# Patient Record
Sex: Male | Born: 1950 | Race: White | Hispanic: No | Smoking: Former smoker
Health system: Southern US, Community
[De-identification: ages and names within clinical notes are randomized; demographics above are authoritative.]

## PROBLEM LIST (undated history)

## (undated) DIAGNOSIS — Z8679 Personal history of other diseases of the circulatory system: Secondary | ICD-10-CM

## (undated) DIAGNOSIS — T8859XA Other complications of anesthesia, initial encounter: Secondary | ICD-10-CM

## (undated) DIAGNOSIS — N200 Calculus of kidney: Secondary | ICD-10-CM

## (undated) DIAGNOSIS — M51379 Other intervertebral disc degeneration, lumbosacral region without mention of lumbar back pain or lower extremity pain: Secondary | ICD-10-CM

## (undated) DIAGNOSIS — E782 Mixed hyperlipidemia: Secondary | ICD-10-CM

## (undated) DIAGNOSIS — T4145XA Adverse effect of unspecified anesthetic, initial encounter: Secondary | ICD-10-CM

## (undated) DIAGNOSIS — M1990 Unspecified osteoarthritis, unspecified site: Secondary | ICD-10-CM

## (undated) DIAGNOSIS — N201 Calculus of ureter: Secondary | ICD-10-CM

## (undated) DIAGNOSIS — N400 Enlarged prostate without lower urinary tract symptoms: Secondary | ICD-10-CM

## (undated) DIAGNOSIS — R319 Hematuria, unspecified: Secondary | ICD-10-CM

## (undated) DIAGNOSIS — K219 Gastro-esophageal reflux disease without esophagitis: Secondary | ICD-10-CM

## (undated) DIAGNOSIS — M549 Dorsalgia, unspecified: Secondary | ICD-10-CM

## (undated) DIAGNOSIS — N289 Disorder of kidney and ureter, unspecified: Secondary | ICD-10-CM

## (undated) DIAGNOSIS — I10 Essential (primary) hypertension: Secondary | ICD-10-CM

## (undated) DIAGNOSIS — N1831 Chronic kidney disease, stage 3a: Secondary | ICD-10-CM

## (undated) DIAGNOSIS — G8929 Other chronic pain: Secondary | ICD-10-CM

## (undated) DIAGNOSIS — R200 Anesthesia of skin: Secondary | ICD-10-CM

## (undated) DIAGNOSIS — M5137 Other intervertebral disc degeneration, lumbosacral region: Secondary | ICD-10-CM

## (undated) DIAGNOSIS — J189 Pneumonia, unspecified organism: Secondary | ICD-10-CM

## (undated) DIAGNOSIS — Z87442 Personal history of urinary calculi: Secondary | ICD-10-CM

## (undated) HISTORY — PX: BICEPS TENDON REPAIR: SHX566

## (undated) HISTORY — PX: LUMBAR LAMINECTOMY: SHX95

## (undated) HISTORY — PX: ANTERIOR CERVICAL DECOMP/DISCECTOMY FUSION: SHX1161

## (undated) HISTORY — PX: OTHER SURGICAL HISTORY: SHX169

## (undated) HISTORY — PX: EXTRACORPOREAL SHOCK WAVE LITHOTRIPSY: SHX1557

## (undated) HISTORY — PX: CHOLECYSTECTOMY: SHX55

---
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
## 1999-03-26 ENCOUNTER — Encounter: Payer: Self-pay | Admitting: Emergency Medicine

## 1999-03-26 ENCOUNTER — Emergency Department (HOSPITAL_COMMUNITY): Admission: EM | Admit: 1999-03-26 | Discharge: 1999-03-26 | Payer: Self-pay | Admitting: Emergency Medicine

## 1999-04-04 ENCOUNTER — Encounter: Payer: Self-pay | Admitting: Emergency Medicine

## 1999-04-04 ENCOUNTER — Emergency Department (HOSPITAL_COMMUNITY): Admission: EM | Admit: 1999-04-04 | Discharge: 1999-04-04 | Payer: Self-pay | Admitting: Emergency Medicine

## 2002-06-01 ENCOUNTER — Encounter: Payer: Self-pay | Admitting: Neurosurgery

## 2002-06-05 ENCOUNTER — Inpatient Hospital Stay (HOSPITAL_COMMUNITY): Admission: RE | Admit: 2002-06-05 | Discharge: 2002-06-06 | Payer: Self-pay | Admitting: Neurosurgery

## 2002-06-05 ENCOUNTER — Encounter: Payer: Self-pay | Admitting: Neurosurgery

## 2003-02-27 ENCOUNTER — Emergency Department (HOSPITAL_COMMUNITY): Admission: EM | Admit: 2003-02-27 | Discharge: 2003-02-28 | Payer: Self-pay | Admitting: Emergency Medicine

## 2003-02-27 ENCOUNTER — Encounter: Payer: Self-pay | Admitting: Emergency Medicine

## 2003-03-08 ENCOUNTER — Encounter: Payer: Self-pay | Admitting: *Deleted

## 2003-03-08 ENCOUNTER — Emergency Department (HOSPITAL_COMMUNITY): Admission: EM | Admit: 2003-03-08 | Discharge: 2003-03-08 | Payer: Self-pay | Admitting: *Deleted

## 2003-03-21 ENCOUNTER — Encounter: Payer: Self-pay | Admitting: Urology

## 2003-03-21 ENCOUNTER — Ambulatory Visit (HOSPITAL_BASED_OUTPATIENT_CLINIC_OR_DEPARTMENT_OTHER): Admission: RE | Admit: 2003-03-21 | Discharge: 2003-03-21 | Payer: Self-pay | Admitting: Urology

## 2003-04-04 ENCOUNTER — Ambulatory Visit (HOSPITAL_BASED_OUTPATIENT_CLINIC_OR_DEPARTMENT_OTHER): Admission: RE | Admit: 2003-04-04 | Discharge: 2003-04-04 | Payer: Self-pay | Admitting: Urology

## 2005-05-26 ENCOUNTER — Ambulatory Visit (HOSPITAL_COMMUNITY): Admission: RE | Admit: 2005-05-26 | Discharge: 2005-05-26 | Payer: Self-pay | Admitting: Emergency Medicine

## 2006-03-29 ENCOUNTER — Inpatient Hospital Stay (HOSPITAL_COMMUNITY): Admission: RE | Admit: 2006-03-29 | Discharge: 2006-03-30 | Payer: Self-pay | Admitting: Neurosurgery

## 2006-09-02 ENCOUNTER — Encounter: Admission: RE | Admit: 2006-09-02 | Discharge: 2006-12-01 | Payer: Self-pay | Admitting: Neurosurgery

## 2007-06-25 ENCOUNTER — Inpatient Hospital Stay (HOSPITAL_COMMUNITY): Admission: EM | Admit: 2007-06-25 | Discharge: 2007-06-29 | Payer: Self-pay | Admitting: Emergency Medicine

## 2007-06-26 ENCOUNTER — Encounter (INDEPENDENT_AMBULATORY_CARE_PROVIDER_SITE_OTHER): Payer: Self-pay | Admitting: Internal Medicine

## 2007-07-02 ENCOUNTER — Inpatient Hospital Stay (HOSPITAL_COMMUNITY): Admission: EM | Admit: 2007-07-02 | Discharge: 2007-07-08 | Payer: Self-pay | Admitting: Emergency Medicine

## 2007-07-02 ENCOUNTER — Ambulatory Visit: Payer: Self-pay | Admitting: Internal Medicine

## 2007-07-02 ENCOUNTER — Ambulatory Visit: Payer: Self-pay | Admitting: Cardiology

## 2007-07-04 ENCOUNTER — Ambulatory Visit: Payer: Self-pay | Admitting: Infectious Diseases

## 2007-07-04 ENCOUNTER — Encounter: Payer: Self-pay | Admitting: Cardiology

## 2007-07-04 HISTORY — PX: TRANSTHORACIC ECHOCARDIOGRAM: SHX275

## 2009-02-21 ENCOUNTER — Emergency Department (HOSPITAL_COMMUNITY): Admission: EM | Admit: 2009-02-21 | Discharge: 2009-02-22 | Payer: Self-pay | Admitting: Emergency Medicine

## 2009-02-23 ENCOUNTER — Emergency Department (HOSPITAL_COMMUNITY): Admission: EM | Admit: 2009-02-23 | Discharge: 2009-02-23 | Payer: Self-pay | Admitting: Emergency Medicine

## 2009-03-03 ENCOUNTER — Ambulatory Visit (HOSPITAL_COMMUNITY): Admission: RE | Admit: 2009-03-03 | Discharge: 2009-03-03 | Payer: Self-pay | Admitting: Urology

## 2009-03-03 HISTORY — PX: OTHER SURGICAL HISTORY: SHX169

## 2009-03-13 HISTORY — PX: OTHER SURGICAL HISTORY: SHX169

## 2009-03-18 ENCOUNTER — Ambulatory Visit (HOSPITAL_BASED_OUTPATIENT_CLINIC_OR_DEPARTMENT_OTHER): Admission: RE | Admit: 2009-03-18 | Discharge: 2009-03-18 | Payer: Self-pay | Admitting: Urology

## 2009-09-18 ENCOUNTER — Ambulatory Visit (HOSPITAL_COMMUNITY): Admission: RE | Admit: 2009-09-18 | Discharge: 2009-09-18 | Payer: Self-pay | Admitting: Urology

## 2009-09-25 HISTORY — PX: OTHER SURGICAL HISTORY: SHX169

## 2009-11-27 ENCOUNTER — Ambulatory Visit (HOSPITAL_COMMUNITY): Admission: RE | Admit: 2009-11-27 | Discharge: 2009-11-27 | Payer: Self-pay | Admitting: Urology

## 2010-07-30 ENCOUNTER — Inpatient Hospital Stay (HOSPITAL_COMMUNITY): Admission: EM | Admit: 2010-07-30 | Discharge: 2009-09-29 | Payer: Self-pay | Admitting: Emergency Medicine

## 2010-09-07 LAB — CBC
HCT: 49.2 % (ref 39.0–52.0)
Hemoglobin: 16.7 g/dL (ref 13.0–17.0)
MCH: 29.6 pg (ref 26.0–34.0)
MCHC: 33.9 g/dL (ref 30.0–36.0)
MCV: 87.1 fL (ref 78.0–100.0)
Platelets: 187 10*3/uL (ref 150–400)
RBC: 5.65 MIL/uL (ref 4.22–5.81)
RDW: 13.6 % (ref 11.5–15.5)
WBC: 6.9 10*3/uL (ref 4.0–10.5)

## 2010-09-07 LAB — URINALYSIS, ROUTINE W REFLEX MICROSCOPIC
Bilirubin Urine: NEGATIVE
Ketones, ur: NEGATIVE mg/dL
Leukocytes, UA: NEGATIVE
Nitrite: NEGATIVE
Protein, ur: NEGATIVE mg/dL
Specific Gravity, Urine: 1.009 (ref 1.005–1.030)
Urine Glucose, Fasting: NEGATIVE mg/dL
Urobilinogen, UA: 0.2 mg/dL (ref 0.0–1.0)
pH: 6 (ref 5.0–8.0)

## 2010-09-07 LAB — URINE MICROSCOPIC-ADD ON

## 2010-09-07 LAB — BASIC METABOLIC PANEL
BUN: 15 mg/dL (ref 6–23)
CO2: 28 mEq/L (ref 19–32)
Calcium: 9.6 mg/dL (ref 8.4–10.5)
Chloride: 101 mEq/L (ref 96–112)
Creatinine, Ser: 1.56 mg/dL — ABNORMAL HIGH (ref 0.4–1.5)
GFR calc Af Amer: 56 mL/min — ABNORMAL LOW (ref >60)
GFR calc non Af Amer: 46 mL/min — ABNORMAL LOW (ref >60)
Glucose, Bld: 89 mg/dL (ref 70–99)
Potassium: 3.8 mEq/L (ref 3.5–5.1)
Sodium: 137 mEq/L (ref 135–145)

## 2010-09-07 LAB — PROTIME-INR
INR: 1 (ref 0.00–1.49)
Prothrombin Time: 13.4 seconds (ref 11.6–15.2)

## 2010-09-07 LAB — SURGICAL PCR SCREEN
MRSA, PCR: NEGATIVE
Staphylococcus aureus: POSITIVE — AB

## 2010-09-07 LAB — ABO/RH: ABO/RH(D): O NEG

## 2010-09-07 LAB — APTT: aPTT: 26 seconds (ref 24–37)

## 2010-09-08 ENCOUNTER — Inpatient Hospital Stay (HOSPITAL_COMMUNITY)
Admission: RE | Admit: 2010-09-08 | Discharge: 2010-09-10 | Payer: BLUE CROSS/BLUE SHIELD | Source: Home / Self Care | Attending: Neurosurgery | Admitting: Neurosurgery

## 2010-09-08 HISTORY — PX: POSTERIOR FUSION LUMBAR SPINE: SUR632

## 2010-09-14 LAB — CROSSMATCH
ABO/RH(D): O NEG
Antibody Screen: NEGATIVE
Unit division: 0
Unit division: 0
Unit division: 0
Unit division: 0

## 2010-09-14 LAB — CBC
HCT: 38.2 % — ABNORMAL LOW (ref 39.0–52.0)
Hemoglobin: 12.8 g/dL — ABNORMAL LOW (ref 13.0–17.0)
MCH: 29 pg (ref 26.0–34.0)
MCHC: 33.5 g/dL (ref 30.0–36.0)
MCV: 86.6 fL (ref 78.0–100.0)
Platelets: 143 10*3/uL — ABNORMAL LOW (ref 150–400)
RBC: 4.41 MIL/uL (ref 4.22–5.81)
RDW: 13.6 % (ref 11.5–15.5)
WBC: 11.2 10*3/uL — ABNORMAL HIGH (ref 4.0–10.5)

## 2010-09-14 LAB — BASIC METABOLIC PANEL
BUN: 16 mg/dL (ref 6–23)
CO2: 26 mEq/L (ref 19–32)
Calcium: 8.2 mg/dL — ABNORMAL LOW (ref 8.4–10.5)
Chloride: 105 mEq/L (ref 96–112)
Creatinine, Ser: 1.43 mg/dL (ref 0.4–1.5)
GFR calc Af Amer: >60 mL/min (ref >60)
GFR calc non Af Amer: 51 mL/min — ABNORMAL LOW (ref >60)
Glucose, Bld: 124 mg/dL — ABNORMAL HIGH (ref 70–99)
Potassium: 4 mEq/L (ref 3.5–5.1)
Sodium: 135 mEq/L (ref 135–145)

## 2010-09-14 NOTE — Op Note (Signed)
NAME:  JAYSEN, WEY NO.:  [id number]  MEDICAL RECORD NO.:  [id number]          PATIENT TYPE:  INP  LOCATION:  2899                         FACILITY:  [facility]  PHYSICIAN:  Clydene Fake, M.D.  DATE OF BIRTH:  March 17, 1951  DATE OF PROCEDURE:  09/08/2010 DATE OF DISCHARGE:                              OPERATIVE REPORT   PREOPERATIVE DIAGNOSES:  Lumbar stenosis, degenerative disk disease, spondylosis with prior surgery L3-S1 and retrolisthesis 4-5.  POSTOPERATIVE DIAGNOSES:  Lumbar stenosis, degenerative disk disease, spondylosis with prior surgery L3-S1 and retrolisthesis 4-5.  PROCEDURE:  Redo decompressive laminectomy, decompressing the L3, L4, L5, and S1 roots (four levels), posterolateral fusion L3-S1 (three levels), segmented Expedient pedicle screw fixation L3 through S1, autograft same incision, infuse BMP, microdissection microscope.  SURGEON:  Clydene Fake, M.D.  ASSISTANT:  Coletta Memos, M.D.  ANESTHESIA:  General endotracheal tube anesthesia.  ESTIMATED BLOOD LOSS:  1550 mL.  BLOOD GIVEN:  660 mL, CellSaver return.  COMPLICATIONS:  A rent occurred, which was repaired primarily with 6-0 Prolene and sealed with DuraSeal.  REASON FOR PROCEDURE:  The patient is a 60 year old gentleman who has had back pain radiating to his right greater than left leg with trouble doing activities and ambulating.  Workup include x-rays and MRI showing retrolisthesis of 4-5, multilevel spondylitic change and lateral recess stenosis at multiple levels with prior surgery at the 5-1 level.  The patient was brought in for decompression and fusion of the lumbar spine. He has been through years of conservative management, NSAIDs, and epidural injections.  He has been through physical therapy and was doing home exercises and none of this has helped.  PROCEDURE IN DETAIL:  The patient was brought into the operating room. General anesthesia was induced.  The patient  was placed in prone position on the Wilson frame with all pressure points padded.  The patient was prepped and draped in sterile fashion.  Site of incision injected with 20 mL of 1% lidocaine with epinephrine.  Incision was made in the lower lumbar spine, site of prior surgery, and this incision extended cephalad from there.  Incision taken down the fascia. Hemostasis obtained with Bovie cauterization.  The fascia was incised. Bilateral and subperiosteal dissection was done over the spinous process of the lamina to the facets.  On the significant right side, we exposed the transverse processes of L3, 4, 5 in lateral sacrum and I placed markers there and took an x-ray, and this confirmed our positioning.  We exposed the contralateral side and placed subcutaneous retractors.  I could see the scar at the L5-1 level on the right side.  We used decompressive laminectomy decompressing the spinous process of lamina of L3 through top of the sacrum.  We carefully decompressed around the __________ dissected through the end of dura where a rent occurred near the previous 5-1 surgery.  Microscopic microdissection continued to decompress the thecal sac and then the dural rent was able to be repaired primarily with 6-0 Prolene with a figure-of-eight suture. After that, there was no further leak of CSF.  Then, we continued our decompressive laminectomy to decompress the  central canal and then decompressing out laterally, doing a medial facetectomies throughout the extent of our procedure L3 through sacrum and performing foraminotomies, decompressing the L3, L4, L5, and S1 roots bilaterally.  Because of CSF leak, we did have some increased bleeding.  We used CellSaver.  With that, patient remained hemodynamically stable.  We explored the disk spaces and there was broad-based disk bulge in the base of the disk space, and the dura was able to be dissected away from the disk space with good decompression  of the central canal and the foramen.  With the nerve roots all decompressed, it was decided not to proceed with posterior lumbar interbody fusion and just posterior fusion with instrumentation since we had good decompression.  At this point, the transverse process and lateral facets were decorticated with high-speed drill from L3-S1 bilaterally, using intraoperative landmarks and fluoroscopy as a guide, pedicle entry points were found.  High-speed drill was used to decorticate.  Placed a probe down the pedicle. Checked the hole with a small ball probe, making sure we had good bony circumference.  We tapped the hole and then placed a X medium screw, 50- mm long and 6-mm wide into the pedicle, and this was done bilaterally at L3, L4, L5, and S1.  We placed rods into the screw heads and tightened down the nuts.  Then, we took final AP and lateral fluoroscopic images showing good position of the screws and rods from L3-S1.  The nuts were then final tightened.  Infuse BMP was then packed into the posterolateral gutters from L3 through S1 and then autograft bone.  All the bone removed during laminectomy that was cleaned from its soft tissue, chopped up into small pieces, and this bone was packed in the posterolateral gutters for posterolateral fusion, L3 through S1 bilaterally.  We then placed a cross-connector between the rods for further support.  We then explored the dura and nerve roots again, ensuring we had good decompression of the central canal and the L3-S1 nerve roots bilaterally.  DuraSeal was placed over the area of the dural rents and retractors removed.  Fascia closed with 0 Vicryl interrupted sutures.  Subcutaneous tissue closed with 2-0 and 3-0 Vicryl interrupted sutures.  Skin closed with benzoin and Steri-Strips.  Dressing was placed.  The patient was placed back in supine position, reversed from anesthesia, and transferred to recovery in stable condition.           ______________________________ Clydene Fake, M.D.     JRH/MEDQ  D:  09/08/2010  T:  09/09/2010  Job:  [id number]  Electronically Signed by Colon Branch M.D. on 09/14/2010 12:38:18 PM

## 2010-11-11 LAB — DIFFERENTIAL
Basophils Absolute: 0 10*3/uL (ref 0.0–0.1)
Basophils Absolute: 0 10*3/uL (ref 0.0–0.1)
Basophils Absolute: 0.1 10*3/uL (ref 0.0–0.1)
Basophils Absolute: 0.1 10*3/uL (ref 0.0–0.1)
Basophils Relative: 0 % (ref 0–1)
Basophils Relative: 0 % (ref 0–1)
Basophils Relative: 1 % (ref 0–1)
Basophils Relative: 1 % (ref 0–1)
Eosinophils Absolute: 0 10*3/uL (ref 0.0–0.7)
Eosinophils Absolute: 0 10*3/uL (ref 0.0–0.7)
Eosinophils Absolute: 0.1 10*3/uL (ref 0.0–0.7)
Eosinophils Absolute: 0.2 10*3/uL (ref 0.0–0.7)
Eosinophils Relative: 0 % (ref 0–5)
Eosinophils Relative: 1 % (ref 0–5)
Eosinophils Relative: 1 % (ref 0–5)
Eosinophils Relative: 2 % (ref 0–5)
Lymphocytes Relative: 10 % — ABNORMAL LOW (ref 12–46)
Lymphocytes Relative: 11 % — ABNORMAL LOW (ref 12–46)
Lymphocytes Relative: 19 % (ref 12–46)
Lymphocytes Relative: 8 % — ABNORMAL LOW (ref 12–46)
Lymphs Abs: 1 10*3/uL (ref 0.7–4.0)
Lymphs Abs: 1 10*3/uL (ref 0.7–4.0)
Lymphs Abs: 1.3 10*3/uL (ref 0.7–4.0)
Lymphs Abs: 2.2 10*3/uL (ref 0.7–4.0)
Monocytes Absolute: 1 10*3/uL (ref 0.1–1.0)
Monocytes Absolute: 1.2 10*3/uL — ABNORMAL HIGH (ref 0.1–1.0)
Monocytes Absolute: 1.3 10*3/uL — ABNORMAL HIGH (ref 0.1–1.0)
Monocytes Absolute: 1.4 10*3/uL — ABNORMAL HIGH (ref 0.1–1.0)
Monocytes Relative: 11 % (ref 3–12)
Monocytes Relative: 12 % (ref 3–12)
Monocytes Relative: 12 % (ref 3–12)
Monocytes Relative: 8 % (ref 3–12)
Neutro Abs: 7.7 10*3/uL (ref 1.7–7.7)
Neutro Abs: 8 10*3/uL — ABNORMAL HIGH (ref 1.7–7.7)
Neutro Abs: 9.1 10*3/uL — ABNORMAL HIGH (ref 1.7–7.7)
Neutro Abs: 9.6 10*3/uL — ABNORMAL HIGH (ref 1.7–7.7)
Neutrophils Relative %: 67 % (ref 43–77)
Neutrophils Relative %: 76 % (ref 43–77)
Neutrophils Relative %: 78 % — ABNORMAL HIGH (ref 43–77)
Neutrophils Relative %: 82 % — ABNORMAL HIGH (ref 43–77)

## 2010-11-11 LAB — CBC
HCT: 38.1 % — ABNORMAL LOW (ref 39.0–52.0)
HCT: 39.2 % (ref 39.0–52.0)
HCT: 39.4 % (ref 39.0–52.0)
HCT: 43.1 % (ref 39.0–52.0)
Hemoglobin: 13.2 g/dL (ref 13.0–17.0)
Hemoglobin: 13.4 g/dL (ref 13.0–17.0)
Hemoglobin: 13.6 g/dL (ref 13.0–17.0)
Hemoglobin: 14.8 g/dL (ref 13.0–17.0)
MCHC: 34 g/dL (ref 30.0–36.0)
MCHC: 34.4 g/dL (ref 30.0–36.0)
MCHC: 34.6 g/dL (ref 30.0–36.0)
MCHC: 34.6 g/dL (ref 30.0–36.0)
MCV: 88.5 fL (ref 78.0–100.0)
MCV: 88.6 fL (ref 78.0–100.0)
MCV: 88.8 fL (ref 78.0–100.0)
MCV: 88.8 fL (ref 78.0–100.0)
Platelets: 168 10*3/uL (ref 150–400)
Platelets: 194 10*3/uL (ref 150–400)
Platelets: 211 10*3/uL (ref 150–400)
Platelets: 220 10*3/uL (ref 150–400)
RBC: 4.3 MIL/uL (ref 4.22–5.81)
RBC: 4.41 MIL/uL (ref 4.22–5.81)
RBC: 4.45 MIL/uL (ref 4.22–5.81)
RBC: 4.87 MIL/uL (ref 4.22–5.81)
RDW: 13.1 % (ref 11.5–15.5)
RDW: 13.2 % (ref 11.5–15.5)
RDW: 13.4 % (ref 11.5–15.5)
RDW: 13.6 % (ref 11.5–15.5)
WBC: 10.1 10*3/uL (ref 4.0–10.5)
WBC: 11.7 10*3/uL — ABNORMAL HIGH (ref 4.0–10.5)
WBC: 11.7 10*3/uL — ABNORMAL HIGH (ref 4.0–10.5)
WBC: 11.8 10*3/uL — ABNORMAL HIGH (ref 4.0–10.5)

## 2010-11-11 LAB — URINE CULTURE
Colony Count: 30000
Colony Count: 75000
Special Requests: NEGATIVE

## 2010-11-11 LAB — CULTURE, BLOOD (ROUTINE X 2)
Culture: NO GROWTH
Culture: NO GROWTH

## 2010-11-11 LAB — URINE MICROSCOPIC-ADD ON

## 2010-11-11 LAB — POCT I-STAT, CHEM 8
BUN: 22 mg/dL (ref 6–23)
Calcium, Ion: 1.18 mmol/L (ref 1.12–1.32)
Chloride: 106 mEq/L (ref 96–112)
Creatinine, Ser: 1.8 mg/dL — ABNORMAL HIGH (ref 0.4–1.5)
Glucose, Bld: 126 mg/dL — ABNORMAL HIGH (ref 70–99)
HCT: 45 % (ref 39.0–52.0)
Hemoglobin: 15.3 g/dL (ref 13.0–17.0)
Potassium: 4 mEq/L (ref 3.5–5.1)
Sodium: 137 mEq/L (ref 135–145)
TCO2: 27 mmol/L (ref 0–100)

## 2010-11-11 LAB — URINALYSIS, ROUTINE W REFLEX MICROSCOPIC
Bilirubin Urine: NEGATIVE
Glucose, UA: NEGATIVE mg/dL
Hgb urine dipstick: NEGATIVE
Ketones, ur: NEGATIVE mg/dL
Nitrite: NEGATIVE
Protein, ur: NEGATIVE mg/dL
Specific Gravity, Urine: 1.014 (ref 1.005–1.030)
Urobilinogen, UA: 1 mg/dL (ref 0.0–1.0)
pH: 8.5 — ABNORMAL HIGH (ref 5.0–8.0)

## 2010-11-29 LAB — URINALYSIS, ROUTINE W REFLEX MICROSCOPIC
Bilirubin Urine: NEGATIVE
Bilirubin Urine: NEGATIVE
Glucose, UA: NEGATIVE mg/dL
Glucose, UA: NEGATIVE mg/dL
Ketones, ur: NEGATIVE mg/dL
Ketones, ur: NEGATIVE mg/dL
Nitrite: NEGATIVE
Nitrite: NEGATIVE
Protein, ur: 30 mg/dL — AB
Protein, ur: NEGATIVE mg/dL
Specific Gravity, Urine: 1.021 (ref 1.005–1.030)
Specific Gravity, Urine: 1.026 (ref 1.005–1.030)
Urobilinogen, UA: 0.2 mg/dL (ref 0.0–1.0)
Urobilinogen, UA: 1 mg/dL (ref 0.0–1.0)
pH: 6 (ref 5.0–8.0)
pH: 6.5 (ref 5.0–8.0)

## 2010-11-29 LAB — DIFFERENTIAL
Basophils Absolute: 0.1 10*3/uL (ref 0.0–0.1)
Basophils Relative: 1 % (ref 0–1)
Eosinophils Absolute: 0.2 10*3/uL (ref 0.0–0.7)
Eosinophils Relative: 2 % (ref 0–5)
Lymphocytes Relative: 21 % (ref 12–46)
Lymphs Abs: 1.8 10*3/uL (ref 0.7–4.0)
Monocytes Absolute: 0.8 10*3/uL (ref 0.1–1.0)
Monocytes Relative: 10 % (ref 3–12)
Neutro Abs: 5.7 10*3/uL (ref 1.7–7.7)
Neutrophils Relative %: 67 % (ref 43–77)

## 2010-11-29 LAB — COMPREHENSIVE METABOLIC PANEL
ALT: 17 U/L (ref 0–53)
AST: 21 U/L (ref 0–37)
Albumin: 4 g/dL (ref 3.5–5.2)
Alkaline Phosphatase: 75 U/L (ref 39–117)
BUN: 26 mg/dL — ABNORMAL HIGH (ref 6–23)
CO2: 28 mEq/L (ref 19–32)
Calcium: 9.2 mg/dL (ref 8.4–10.5)
Chloride: 108 mEq/L (ref 96–112)
Creatinine, Ser: 1.55 mg/dL — ABNORMAL HIGH (ref 0.4–1.5)
GFR calc Af Amer: 56 mL/min — ABNORMAL LOW (ref >60)
GFR calc non Af Amer: 47 mL/min — ABNORMAL LOW (ref >60)
Glucose, Bld: 106 mg/dL — ABNORMAL HIGH (ref 70–99)
Potassium: 4 mEq/L (ref 3.5–5.1)
Sodium: 140 mEq/L (ref 135–145)
Total Bilirubin: 0.9 mg/dL (ref 0.3–1.2)
Total Protein: 6.8 g/dL (ref 6.0–8.3)

## 2010-11-29 LAB — CBC
HCT: 43.9 % (ref 39.0–52.0)
Hemoglobin: 15.1 g/dL (ref 13.0–17.0)
MCHC: 34.3 g/dL (ref 30.0–36.0)
MCV: 88.5 fL (ref 78.0–100.0)
Platelets: 182 10*3/uL (ref 150–400)
RBC: 4.96 MIL/uL (ref 4.22–5.81)
RDW: 13.5 % (ref 11.5–15.5)
WBC: 8.6 10*3/uL (ref 4.0–10.5)

## 2010-11-29 LAB — URINE MICROSCOPIC-ADD ON

## 2010-11-29 LAB — URINE CULTURE: Colony Count: 5000

## 2010-11-29 LAB — HEMOGLOBIN AND HEMATOCRIT, BLOOD
HCT: 40.8 % (ref 39.0–52.0)
Hemoglobin: 13.7 g/dL (ref 13.0–17.0)

## 2010-11-29 LAB — POCT HEMOGLOBIN-HEMACUE: Hemoglobin: 15.1 g/dL (ref 13.0–17.0)

## 2010-11-29 LAB — LIPASE, BLOOD: Lipase: 26 U/L (ref 11–59)

## 2011-01-05 NOTE — Consult Note (Signed)
NAME:  Anthony [name], Anthony [name] NO.:  [id number]   MEDICAL RECORD NO.:  [id number]          PATIENT TYPE:  INP   LOCATION:  3707                         FACILITY:  [facility]   PHYSICIAN:  Arturo Morton. Riley Kill, MD, FACCDATE OF BIRTH:  03/20/1951   DATE OF CONSULTATION:  07/03/2007  DATE OF DISCHARGE:                                 CONSULTATION   CHIEF COMPLAINT:  Chest pain, shortness of breath.   HISTORY OF PRESENT ILLNESS:  Anthony [name] is a 60 year old gentleman who  presented originally about a week ago with some low blood pressure,  tachycardia, and fever.  He was evaluated and thought to possibly have  urosepsis.  He was treated with antibiotics and discharged.  He re-  presented with recurrent chest pain.  The pain was sometimes sharp.  He  had been placed on Celebrex and subsequently stopped this.  He has had  some chills as well.  His initial chest x-ray demonstrated low-grade  mild atelectasis.  Repeat chest x-ray now demonstrates some increasing  bilateral lower lobe atelectasis.  Blood cultures were negative.  White  blood cell count peak was 9300.  Since discharge from the hospital, he  has not been significantly improved.   ALLERGIES:  OXYCODONE AND CODEINE.   CURRENT MEDICATIONS:  1. Aspirin 325 mg daily.  2. Rocephin.  3. Lovenox 40 mg daily.  4. Toprol-XL 12.5 mg daily.  5. Protonix 40 mg daily.  6. Potassium.  7. He is currently getting IV fluids.   He is currently not taking the Celebrex, and he has stopped Aleve.   PAST MEDICAL HISTORY:  1. Initially acute renal failure.  2. He has a history of mitral valve prolapse.  3. History of nephrolithiasis in the past.  4. He has had neck surgery x2.  5. Lumbar surgery.  6. Right biceps repair.   SOCIAL HISTORY:  The patient lives in [place] and alone.  He is an  unemployed Naval architect.  He is divorced.  He smoked a pipe but quit 3  years ago.   FAMILY HISTORY:  Mother died at 86 of a CVA.  Father died at  30, had a  CVA.  He has four siblings, one with CVA.   REVIEW OF SYSTEMS:  He has had fever and chills.  He says he continues  to feel chilling.  He has had some mild arthralgias.   PHYSICAL EXAMINATION:  GENERAL:  He is alert and oriented, in no acute  distress.  VITAL SIGNS:  The current blood pressure is 122/70, respiratory rate 16,  pulse 86, temperature 98.3, saturations are 92% on 2 liters.  NECK:  His jugular veins are not distended.  HEENT:  Unremarkable.  LUNGS:  Some decreased breath sounds at the base but no dullness to  percussion.  PMI is nondisplaced.  There is a normal first and second  heart sound.  There sounds like a prominent S4 gallop.  ABDOMEN:  Soft.  EXTREMITIES:  No definite edema.  There is no evidence of cellulitis.   LABORATORY DATA:  Hemoglobin is 11.7, hematocrit 34.6, white count  10,400.  BUN 17, creatinine 1.34.  Cardiac enzymes are negative x3.   EKG reveals sinus rhythm/sinus tachycardia.  There is a vertical and/or  rightward axis.  There is a small Q in III with T wave inversion.  There  is an S wave in lead I with a with vertical axis noted.   IMPRESSION:  Low-grade fever with bilateral atelectasis and right axis  deviation on electrocardiogram with bilateral atelectasis that is  slightly worse, fever, hypotension, and tachycardia.  I would be  concerned about the possibility of pulmonary emboli.  I would also be  concerned about the possibility of subacute bacterial endocarditis,  although blood cultures to date have been negative.   RECOMMENDATIONS:  1. D-dimer and 2-D echocardiogram.  2. ID consult.  3. Initiate IV heparin with gentle hydration, and when creatinine is      normal, consider a CT angio if the D-dimer is elevated.   I have discussed this with Dr. Lavera Guise, and he is in agreement with our  plans.      Arturo Morton. Riley Kill, MD, [facility]  Electronically Signed     TDS/MEDQ  D:  07/03/2007  T:  07/04/2007  Job:  [id number]

## 2011-01-05 NOTE — Op Note (Signed)
NAME:  Anthony [name], Anthony [name] NO.:  [id number]   MEDICAL RECORD NO.:  [id number]          PATIENT TYPE:  AMB   LOCATION:  DAY                          FACILITY:  [facility]   PHYSICIAN:  Excell Seltzer. Annabell Howells, M.D.    DATE OF BIRTH:  Jan 04, 1951   DATE OF PROCEDURE:  03/03/2009  DATE OF DISCHARGE:  03/03/2009                               OPERATIVE REPORT   PROCEDURE:  Cystoscopy, right retrograde pyelogram with interpretation,  right ureteroscopy with attempted stone manipulation, insertion of right  double-J stent.   PREOPERATIVE DIAGNOSIS:  Right proximal ureteral stone.   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   DRAIN:  6-French 26 cm double-J stent.   COMPLICATIONS:  None.   INDICATIONS:  Mr. Guilmette is a 60 year old male with history of stones  who been having persistent right flank pain and nausea.  He had a CT  scan several days ago that showed a right proximal stone that was about  2-3 mm.  KUBs in the office did not reveal an obvious stone.  There was  a questionable calcification in the right pelvis that was initially felt  to be vascular but that could have been a stone and after discussing  options with the patient I felt that ureteroscopy was indicated.   FINDINGS AND PROCEDURE:  The patient was taken to the operating room  after receiving Cipro.  A general anesthetic was induced.  He was placed  in lithotomy position.  His perineum and genitalia were prepped with  Betadine solution.  He was draped in the usual sterile fashion.  Cystoscopy was performed using a 22-French scope and 12 and 70 degrees  lenses.  Examination revealed a normal urethra.  The external sphincter  was intact.  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  Examination of bladder revealed mild trabeculation.  No  tumor, stones or inflammation were noted.  Ureteral orifices were  unremarkable.   The right ureteral  orifice was cannulated with 5-French open-end  catheter and contrast was instilled.   A right retrograde pyelogram demonstrated a small filling defect in the  proximal ureter consistent with the stone seen on CT scan.  There was no  hydronephrosis noted.   The guidewire was then passed through the open-end catheter to the  kidney.  The open-end catheter was removed along with the cystoscope.  A  12-French introducer sheath dilator was then passed over the wire but  would not progress beyond the iliac vessels.   Ureteroscope was then passed alongside the wire up to the iliac vessels  where there appeared to be narrowing consistent with stricture more than  kinking from the iliac vessels.   At this point I elected to place a double-J stent.  A 6-French 26 cm  stent was placed to the kidney without difficulty under fluoroscopic  guidance.  The wire was removed leaving good coil in the kidney and good  coil in the bladder.  The patient's bladder was drained.  The cystoscope  was removed.  He was taken down  from lithotomy position.  His  anesthetic was reversed.  He was removed to the recovery room in stable  condition.  He has sufficient medicine on hand and I am giving him  Pyridium for burning.  I will arrange a repeat ureteroscopy in 2 weeks  to give the ureter a chance to self dilate to allow easier access to the  stone.      Excell Seltzer. Annabell Howells, M.D.  Electronically Signed     JJW/MEDQ  D:  03/03/2009  T:  03/04/2009  Job:  [id number]

## 2011-01-05 NOTE — H&P (Signed)
NAME:  Anthony [name], Anthony [name] NO.:  [id number]   MEDICAL RECORD NO.:  [id number]          PATIENT TYPE:  INP   LOCATION:  2907                         FACILITY:  [facility]   PHYSICIAN:  Della Goo, M.D. DATE OF BIRTH:  01/09/1951   DATE OF ADMISSION:  06/25/2007  DATE OF DISCHARGE:                              HISTORY & PHYSICAL   PRIMARY CARE PHYSICIAN:  This is an unassigned patient.   CHIEF COMPLAINT:  Chest pain, nausea and vomiting.   HISTORY OF PRESENT ILLNESS:  This is a 60 year old male who was brought  to the emergency department secondary to onset of nausea and vomiting at  6 p.m.  He reports starting to have fevers, chills as well at that time.  He also describes having chest pain, which is substernal without  radiation, which has been going on for the past few days.  He associates  the chest pain with taking Celebrex medication and reports stopping this  one day ago.  He reports chest pain feels like a heaviness in his chest.  The patient also reports having dizziness and feeling as if he will pass  out.  On initial evaluation, the patient was found to have hypotension  and tachycardia.  He also was found to be febrile with a temperature of  101.6.  The patient reports eating in the evening at a [employer].  The patient describes his chest pain as being at the worst  a 7 to 8/10.   PAST MEDICAL HISTORY:  Past medical history is significant for:  1. Lumbar disk disease/degenerative disease.  2. Kidney stones.  3. Mitral valve prolapse.   MEDICATIONS:  Medications at this time are none.  The patient had  previously been on Celebrex 200 mg 1 p.o. daily.   PAST SURGICAL HISTORY:  1. History of cervical spine surgery times two.  2. Lumbar surgeries times two.  3. A right bicep tendon surgery.   SOCIAL HISTORY:  The patient is a nonsmoker, nondrinker.   FAMILY HISTORY:  Negative for coronary artery disease.  Positive for  hypertension,  diabetes mellitus in his mother.  Positive for CVA in both  parents, and positive in cancer in his mother and maternal family with  colon cancer.   REVIEW OF SYSTEMS:  Pertinents are mentioned above.  The patient denies  having any hematemesis, melena, hematochezia.  He denies having any  diarrhea or constipation.   PHYSICAL EXAMINATION:  GENERAL:  This is a pleasant, well-developed,  well-nourished 60 year old male in acute distress.  VITAL SIGNS:  Temperature 101.6.  Blood pressure initially 90/53.  It  decreased to 63/46.  Heart rate 131.  Respirations 22.  O2 saturation 95  to 98%.  HEENT:  Normocephalic, atraumatic.  There is facial erythema/flushing  present.  Pupils are equally round and reactive to light.  Extraocular  muscles are intact.  Funduscopic is benign.  Oropharynx is clear.  Dentition is poor.  There are multiple dental caries present.  NECK:  Supple with full range of motion.  No thyromegaly, adenopathy,  jugulovenous distention.  CARDIOVASCULAR:  Tachycardic rate  and rhythm.  No murmurs, gallops or  rubs.  RESPIRATORY:  Clear to auscultation bilaterally.  ABDOMEN:  Positive bowel sounds, soft, nontender, nondistended.  EXTREMITIES:  Without clubbing, cyanosis or edema.  NEUROLOGIC EXAMINATION:  Alert and oriented times three.  There are no  focal deficits.   LABORATORY DATA:  Sodium 138, potassium 4.0, chloride 109, hemoglobin  16.3, hematocrit 48.0, sodium 138, potassium 4.0, chloride 109, bicarb  23, BUN 22, creatinine 2.0,   ASSESSMENT:  A 60 year old male being admitted with:  1. Sepsis and septic shock.  2. Nausea and vomiting, possible food poisoning.  3. Chest pain.  4. Degenerative disk disease.  5. Mitral valve prolapse   PLAN:  The patient will continue on IV fluids for fluid resuscitation.  Blood cultures have been sent as well as urine cultures.  The urinalysis  is negative except for small leukocyte esterase.  The patient has been  placed  empirically on antibiotic therapy of vancomycin, Zosyn and  ciprofloxacin.  The patient does have mitral valvae prolapse and a  concern also is that the patient could have bacterial endocarditis.  He  does have very poor dentition.  The patient will also be placed in the  telemetry area for cardiac monitoring and cardiac enzymes will continue.  The patient will be placed on a dopamine drip if needed to help maintain  his blood pressure.  His antibiotic therapy will be further adjusted  pending culture results.  In the event the patient begins to have  diarrhea, stool cultures and studies will be sent as well.  DVT and GI  prophylaxis have been ordered as well.      Della Goo, M.D.  Electronically Signed     HJ/MEDQ  D:  06/26/2007  T:  06/27/2007  Job:  [id number]

## 2011-01-05 NOTE — Op Note (Signed)
NAME:  ESCHOL, AUXIER NO.:  [id number]   MEDICAL RECORD NO.:  [id number]          PATIENT TYPE:  AMB   LOCATION:  NESC                         [facility]ACILITY:  [facility]   PHYSICIAN:  Excell Seltzer. Annabell Howells, M.D.    DATE OF BIRTH:  [birth year]   DATE OF PROCEDURE:  03/18/2009  DATE OF DISCHARGE:                               OPERATIVE REPORT   PROCEDURE:  Cystoscopy, right ureteral stent removal, right  ureteroscopic stone extraction.   PREOPERATIVE DIAGNOSIS:  Right proximal ureteral stone and ureteral  stricture.   POSTOPERATIVE DIAGNOSIS:  Right proximal ureteral stone and ureteral  stricture.   SURGEON:  Dr. Bjorn Pippin.   ANESTHESIA:  General.   SPECIMENS:  Stone.   DRAIN:  None.   COMPLICATIONS:  None.   INDICATIONS:  Anthony [name] is a 60 year old white male who has a 3-mm  right proximal ureteral stone.  Initial attempt to remove it  ureteroscopically approximately a week and half ago was unsuccessful due  to the ureteral stricturing in the mid ureter.  A stent was placed, and  he returns today for definitive treatment.   FINDINGS OF PROCEDURE:  He was given Cipro.  He was taken operating room  where general anesthetic was induced.  He was placed in lithotomy  position.  His perineum and genitalia were prepped with Betadine  solution.  He was draped in usual sterile fashion.   Cystoscopy was performed using a 22-French scope and 12-degree lens.  The stent was grasped and moved to the urethral meatus where a wire was  passed to the kidney and the stent was removed.   A 6-French short ureteroscope was then inserted alongside the wire.  Initially, I had some difficulty negotiating over the iliac vessels, but  the use of a second wire allowed passage to the proximal ureter where  the stone was identified.  The stone was grasped with a nitinol basket  and removed without difficulty.   The 6-French digital flexible scope was then passed over the wire to the  kidney.   The kidney was inspected.  No additional stones were seen, and  the ureter was inspected upon removal of the scope without residual  stones or ureteral injury.  It was felt a stent was not indicated.   The bladder was drained.  The patient was taken down from the lithotomy  position.  His anesthetic was reversed.  He was moved to the recovery  room in stable condition.  There are no complications.  He will be  discharged home without any medications.  He will stop his Flomax.  He  has hydromorphone for pain, and he will follow-up with me on March 26, 2009 at 3:30.      Excell Seltzer. Annabell Howells, M.D.  Electronically Signed     JJW/MEDQ  D:  03/18/2009  T:  03/18/2009  Job:  [id number]

## 2011-01-05 NOTE — Discharge Summary (Signed)
NAME:  Anthony [name], Anthony [name] NO.:  [id number]   MEDICAL RECORD NO.:  [id number]          PATIENT TYPE:  INP   LOCATION:  6706                         FACILITY:  [facility]   PHYSICIAN:  Beckey Rutter, MD  DATE OF BIRTH:  [birth year]   DATE OF ADMISSION:  06/25/2007  DATE OF DISCHARGE:  06/29/2007                               DISCHARGE SUMMARY   CHIEF COMPLAINT AND PRESENTING ILLNESS:  Chest pain, nausea and  vomiting.   HISTORY OF PRESENT ILLNESS:  A 60 year old with history of renal  calculi, mitral valve prolapse and lumbar disk disease admitted for  pyelonephritis picture.   HOSPITAL COURSE:  1. Sepsis and shock improved with rehydration and antibiotic.  2. Pyelonephritis.  The patient's condition also improved  with      antibiotic.  The patient is fever free for the last 24 hours.  3. Nausea and vomiting improved.  Today the patient is starting to      tolerate food.  4. Acute renal failure picture with creatinine of 2.0.  Now the      creatinine is 1.23.  The patient has no complaints.   DISCHARGE DIAGNOSES:  1. Pyelonephritis  2. Sepsis  3. Acute renal failure likely secondary to dehydration.  4. Degenerative joint disease.  5. Mitral valve prolapse.   DISCHARGE MEDICATIONS:  The patient will be discharged on antibiotic to  continue for course of 14 days.  Prescription of Cipro was given.  So  discharge medication is  1. Ciprofloxacin for 12 more days.  2. Flora-Q tablets p.o. daily.  3. The patient was taking over-the-counter medication for degenerative      joint disease.  He can continue after discharge on same medication.      Beckey Rutter, MD  Electronically Signed     EME/MEDQ  D:  06/29/2007  T:  06/29/2007  Job:  [id number]

## 2011-01-05 NOTE — Discharge Summary (Signed)
NAME:  Anthony [name], Anthony [name] NO.:  [id number]   MEDICAL RECORD NO.:  [id number]          PATIENT TYPE:  INP   LOCATION:  3707                         FACILITY:  [facility]   PHYSICIAN:  Lonia Blood, M.D.DATE OF BIRTH:  August 28, 1950   DATE OF ADMISSION:  07/02/2007  DATE OF DISCHARGE:  07/08/2007                               DISCHARGE SUMMARY   PRIMARY CARE PHYSICIAN:  Dr. Jeannetta Nap   DISCHARGE DIAGNOSES:  1. Fever of unknown origin.      a.     Possible unresolved pyelonephritis.      b.     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      c.     Possible periodontal disease/periapical abscess tooth #21.  2. Noncardiac chest pain - cardiac evaluation unremarkable and      negative for pulmonary embolism.  3. Hypokalemia - secondary to decreased intake - resolved.  4. Mitral valve prolapse.  5. Recent acute renal failure - resolved.   DISCHARGE MEDICATIONS:  1. Clindamycin 300 mg p.o. q.6h. x12 days then stop.  2. Aspirin 81 mg p.o. daily.  3. Zantac p.o. p.r.n.  4. Glucosamine and chondroitin sulfate p.o. p.r.n.   FOLLOW UP:  1. The patient states that he is going to establish care with Dr.      Jeannetta Nap.  He is advised to follow up with Dr. Jeannetta Nap in 3-5 days for      a routine recheck.  2. The patient states that he does have cash and can visit a dentist.      He is advised to obtain a new dentist for evaluation within the      next 10 days and advised that he will likely need extraction of      multiple teeth.   CONSULTATIONS:  1. [facility].  2. Infectious disease.   PROCEDURES:  1. CT scan of the chest - no evidence of pulmonary embolism.  2. CT scan of the head, July 03, 2007 - no acute disease.  3. Nuclear medicine Myoview/cardiac stress test - no evidence of      ischemia with ejection fraction of 65%.   HOSPITAL COURSE:  Mr. Anthony [name] is a very pleasant 60 year old  gentleman, who had recently been hospitalized for a  probable complicated  pyelonephritis and resultant sepsis.  At the time of his initial  hospitalization, a urinalysis was in fact consistent with a significant  pyelo, and the patient had a markedly elevated white count, despite the  fact that urine cultures failed to identify the exact pathogen.  After  empiric treatment with Rocephin IV and then ciprofloxacin p.o., the  patient had improved significantly.  He was discharged home in  significant improved condition.  He returned to the hospital on July 02, 2007, with complaints of severe chest pain.  Further evaluation also  revealed evidence of recurrent hectic fevers.  Cardiology was consulted.  The patient ruled out for acute MI.  Cardiology ultimately proceeded  with a nuclear medicine stress test during the patient's hospital stay  which failed to reveal any evidence of perfusion  deficits.  The patient  was not felt to be suffering with active coronary disease.  Given his  recent hospitalization, pulmonary embolism was also considered.  CT scan  of the chest was carried out and failed to reveal any evidence of  pulmonary embolism.  Fortunately, the patient's symptoms of chest pain  resolved spontaneously during his hospital stay.  The patient's hectic  fevers were somewhat concerning, however.  Blood cultures remained  negative.  Given the patient's history of mitral valve prolapse, the  possibility of endocarditis was entertained.  ID consultation was  carried out.  ID did not reveal that the patient had clinical signs or  symptoms suggestive of subacute bacterial endocarditis.  CT scan of the  head and chest failed to reveal any evidence of hidden abscess.  ID felt  that the patient was likely suffering with staphylococcal or  streptococcal toxic shock syndrome and that this could have represented  the etiology of his initial symptoms during his first hospitalization.  The patient was treated with IV Rocephin which he tolerated  without  difficulty.  At the time of his discharge, he was titrated to p.o.  clindamycin on the recommendation of the ID service.  Due to multiple  carious teeth, consideration of dental abscess was also given.  Orthopantogram was accomplished and did reveal evidence of a small  periapical abscess at tooth #21.  There was also evidence of significant  wide spread periodontal disease.  The patient, however, did not appear  to have a far progressed or supparative abscess.  The patient was having  no significant dental pain.  The patient was tolerating his clindamycin  well, and this appeared to be improving the patient's dental condition  as well.  The patient was cleared for discharge home.  The patient was  given specific instructions, however, that his dental abscess would  simply stabilize and not resolve with antibiotic therapy alone.  He did  assure the doctor that he was able to afford a visit with a dentist and  that he could arrange this on his own and that his ultimate goal was  likely to have the remainder of his teeth extracted.  The importance of  accomplishing this followup was stressed upon the patient by the  dictating physician.  Otherwise, the patient was stable.  He was able to  tolerate regular intake without difficulty.  He was up and around.  He  was afebrile, and vital signs were stable.  The patient was therefore  cleared for discharge home on July 08, 2007, with followup as  detailed above and with ongoing antibiotic therapy as suggested by  infectious disease.      Lonia Blood, M.D.  Electronically Signed     JTM/MEDQ  D:  07/08/2007  T:  07/08/2007  Job:  [id number]   cc:   Windle Guard, M.D.

## 2011-01-05 NOTE — H&P (Signed)
NAME:  RUFUS, BESKE NO.:  [id number]   MEDICAL RECORD NO.:  [id number]          PATIENT TYPE:  INP   LOCATION:  3707                         FACILITY:  [facility]   PHYSICIAN:  Della Goo, M.D. DATE OF BIRTH:  1950-12-14   DATE OF ADMISSION:  07/02/2007  DATE OF DISCHARGE:                              HISTORY & PHYSICAL   This is an unassigned patient.   CHIEF COMPLAINT:  Chest pain.   HISTORY OF PRESENT ILLNESS:  This is a 60 year old male who presented  for readmission after being hospitalized from June 26, 2007 until  June 29, 2007 where he was hospitalized with sepsis, infected kidney  stone and chest pain, nausea, vomiting.  The patient has a history of  mitral valve prolapse.  He reports having pain in the center of his  chest along with chills and shakes.  He describes the pain at the worst  as being a 5/10.  He denies having any shortness of breath.  Please  refer to the previous history and physical on June 26, 2007, as well.   PAST MEDICAL HISTORY:  1. Lumbar disk disease/degenerative disease.  2. Kidney stones.  3. Mitral valve prolapse.   PAST SURGICAL HISTORY:  1. Cervical spine surgery x2.  2. Lumbar surgery x2.  3. A right biceps tendon surgery.   ALLERGIES:  CODEINE.   SOCIAL HISTORY:  He is a nonsmoker, nondrinker.   FAMILY HISTORY:  Positive for hypertension and diabetes mellitus in his  mother.  Positive for CVAs in both parents.  Positive for cancer in his  mother, and maternal family with colon cancers.   REVIEW OF SYSTEMS:  Pertinent as mentioned above.   PHYSICAL EXAMINATION:  VITAL SIGNS:  Temperature 99.8, blood pressure  105/60, heart rate 104, respirations 18, O2 saturation 94% on room air.  HEENT:  Normocephalic, atraumatic.  Pupils equally round, reactive to  light.  Extraocular muscles are intact.  Funduscopic benign.  Oropharynx  is clear.  NECK:  Supple.  Full range of motion.  No thyromegaly,  adenopathy,  jugular venous distention.  CARDIOVASCULAR:  Regular rate and rhythm.  No murmurs, gallops or rubs.  LUNGS:  Clear to auscultation bilaterally.  ABDOMEN:  Positive bowel sounds, soft, nontender, nondistended.  EXTREMITIES:  Without cyanosis, clubbing or edema.  NEUROLOGIC:  Alert and oriented x3.  Nonfocal.   LABORATORY STUDIES:  White blood cell count 11.8, hemoglobin 13.8,  hematocrit 40.2, platelets 271, neutrophils 90% lymphocytes 4%.  Sodium  141, potassium 3.5, chloride 106, CO2 of 26, BUN 14, creatinine 1.45,  glucose 135.  Pro time 13.1, INR 1.0.  Urinalysis moderate leukocytes.  Cardiac enzymes - myoglobin 143, CK-MB 1.1, troponin less than 0.05.   CHEST X-RAY:  No active disease process.   ELECTROCARDIOGRAM:  Sinus tachycardia, rate 104.  No acute ST-segment  changes.   ASSESSMENT:  64. A 60 year old male being readmitted with acute chest pain.  2. History of mitral valve prolapse.  3. Urinary tract infection, partially treated.  4. Left renal stone.   PLAN:  The patient will be admitted to a telemetry area  for cardiac  monitoring, and cardiac enzymes will be performed.  The patient has been  placed on an IV nitroglycerin drip which will be titrated to chest pain  as his blood pressure allows.  The patient will be placed on IV fluids  for fluid maintenance and hydration.  The patient will also be placed on  IV antibiotic therapy secondary to his partially treated urinary tract  infection.  The patient will be placed on DVT and GI prophylaxis, as  well.      Della Goo, M.D.  Electronically Signed     HJ/MEDQ  D:  07/02/2007  T:  07/03/2007  Job:  [id number]

## 2011-01-08 NOTE — Op Note (Signed)
NAME:  KHOEN, GENET NO.:  [id number]   MEDICAL RECORD NO.:  [id number]                   PATIENT TYPE:  INP   LOCATION:  3172                                 FACILITY:  [facility]   PHYSICIAN:  Clydene Fake, M.D.               DATE OF BIRTH:  Mar 22, 1951   DATE OF PROCEDURE:  06/05/2002  DATE OF DISCHARGE:                                 OPERATIVE REPORT   PREOPERATIVE DIAGNOSES:  Herniated nucleus pulposis and spondylosis at C5-6  and 6-7 with a right-sided radiculopathy.   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   PROCEDURES:  Anterior cervical diskectomy and decompression and fusion at C5-  6 and 6-7 with Tutagen allograft bone and tether anterior cervical plate.   SURGEON:  Clydene Fake, M.D.   ASSISTANT:  Coletta Memos, MD   ANESTHESIA:  General endotracheal tube anesthesia.   ESTIMATED BLOOD LOSS:  Minimal.   BLOOD GIVEN:  None.   DRAINS:  None.   COMPLICATIONS:  None.   REASON FOR PROCEDURE:  The patient is a 60 year old gentleman, who over the  last couple of months had neck pain radiating to the right side and a month  ago started having severe right shoulder and arm pain radiating down into  his middle fingers.  __________ helped a little bit.  Still having trouble  sleeping secondary to pain.  On examination, he does have 4/5 strength in  right triceps and finger extension, decreased sensation in both the right C6  and 7 distributions.  MRI was done showing serious spondylosis and HNP on  the right side at 5-6 and 6-7.  The patient was brought in for decompression  and fusion.   DESCRIPTION OF PROCEDURE:  The patient was brought into the operating room  and general anesthesia was induced.  The patient was placed in halter  traction with 10 pounds; and prepped and draped in a sterile fashion.  Site  of incision was injected with 10 cc of 1%  lidocaine with epinephrine.  An  incision was then made on the left side of the left side of the neck from  the midline to the anterior border of the sternocleidomastoid muscle.  The  incision was taken down to the platysma.  Hemostasis was obtained with Bovie  cauterization.  The platysma was incised with the Bovie and blunt dissection  was taken through the anterior cervical fascia to the anterior cervical  spine, where a needle was placed in the interspace and an x-ray was obtained  showing [place] at the 5-6 interspace.  The disk space was incised with a 15-  blade and a partial diskectomy was performed with pituitary rongeurs as the  needle was removed.  The longus colli muscle was then reflected laterally on  each side using the Bovie.  There were very large osteophytes over  on the  right side at 5-6 and 6-7.  These were removed with rongeurs and a self-  retaining retractor system was placed.  The disk space at 5-6 and 6-7 were  incised with a 15-blade and diskectomy was performed with pituitary rongeurs  and curets.  The microscope was brought in for microdissection.  At this  point, the distraction pins were placed in the C5 and C7 and the interspace  was distracted.  The high-speed drill was then used drill out the  interspace.  I removed the bilateral uncovertebral joints, removing large  osteophytes around the right side.  As we got down to the  posterior cortex,  the posterior longitudinal ligament and the bone was removed with 1 and 2 mm  Kerrison punches.  We decompressed the central thecal sac and bilateral  foramina on the right side very lateral decompression over the root so that  we could make sure the root was decompressed.  This was done at both 5-6 and  6-7.  Once we had good decompression of both the central canal and bilateral  foramina, vertebrotomy was made over a depth gauge.  Hemostasis was then  obtained with Gelfoam and thrombin.  The 7 mm Tutagen bone grafts were  then  measured, they were shorter than the vertebral body.  After the Gelfoam was  irrigated out, these 7 mm grafts were tapped into both the 5-6 and then the  6-7 and the disk space was countersunk about 1.5 mm.  Distraction was  removed and the distraction pins were removed.  Hemostasis was obtained with  Gelfoam and thrombin.  The weight on the traction was also removed.  We then  checked the bone graft.  There was plenty of room between the posterior  aspect of the graft and the dura and they were firmly in place.  Tether  anterior cervical plate was then placed over the anterior cervical spine  with two screws placed in the C5, one in the C6, and two at C7.  X-rays were  obtained showing good position of the plates and screws.  After __________  hemostasis was obtained with Gelfoam and thrombin and bipolar cauterization.  Once we had absolute hemostasis, Gelfoam was irrigated out.  One piece of  Gelfoam was placed into the lateral decompression on the right side over the  nerve roots at each level.  The platysma was then closed with 3-0 Vicryl  suture.  The subcutaneous tissue was closed with the same and the skin was  closed with Benzoin and Steri-Strips.  Dressing was placed and a soft  cervical collar was placed.  The patient was awakened from anesthesia and  transferred to recovery room in stable condition.                                                  Clydene Fake, M.D.    JRH/MEDQ  D:  06/05/2002  T:  06/05/2002  Job:  [id number]

## 2011-01-08 NOTE — Op Note (Signed)
NAME:  Anthony [name], Anthony [name] NO.:  [id number]   MEDICAL RECORD NO.:  [id number]          PATIENT TYPE:  INP   LOCATION:  2899                         FACILITY:  [facility]   PHYSICIAN:  Clydene Fake, M.D.  DATE OF BIRTH:  04/18/1951   DATE OF PROCEDURE:  03/29/2006  DATE OF DISCHARGE:                                 OPERATIVE REPORT   DIAGNOSIS:  Herniated nucleus pulposus C6-T1 with left sided radiculopathy.   POSTOPERATIVE DIAGNOSIS:  Herniated nucleus pulposus C6-T1 with left sided  radiculopathy.   PROCEDURE:  Anterior cervical decompression, diskectomy and fusion at C7-T1  with LifeNet bone __________ anterior cervical plate.   SURGEON:  Dr. Phoebe Perch.   ASSISTANT:  __________.   ANESTHESIA:  General endotracheal tube anesthesia.   ESTIMATED BLOOD LOSS:  Minimal.   BLOOD GIVEN:  None.   DRAINS:  None.   COMPLICATIONS:  None.   REASON FOR PROCEDURE:  The patient is a 60 year old gentleman who has  undergone a 4-5, 4-6 ACDF in the past.  He has developed neck and left arm  pain and numbness.  MRI was done showing a left 7-1 disk herniation  compressing the C8 root.  The patient had __________ and was brought in for  decompression.   PROCEDURE IN DETAIL:  The patient was brought to the operating room and  general anesthesia was induced.  The patient was placed in 10 pounds halter  traction, prepped and draped in a sterile fashion.  His old incision was  injected with 10 mL of 1% lidocaine with epi.  The site of previous scar in  the left side of the neck was then incised in the midline to the anterior  border of the sternocleidomastoid.  Incision taken down to the fascia and to  the platysma.  Hemostasis obtained with Bovie cauterization.  The platysma  was incised with the Bovie, and blunt dissection was taken through the  anterior cervical fascia to the anterior cervical spine.  __________ down to  the spine and then to the bottom part of the plate, we  exposed the spine at  the bottom part of the plate, disk space, placed a needle in the disk space,  took an x-ray and confirmed out positioning at the C7-T1 level.  There did  appear to be enough bone to get a screw in, so we left the upper plate  intact, incised the disk space with a 15-blade, did a partial diskectomy  with a pituitary rongeurs.  The longus colli was reflected laterally with  the Bovie.  Self-retaining retractors were then placed, distraction pins  placed in the C7 and T1 interspace distracted.  Diskectomy was then  continued, removing anterior osteophytes with the Kerrison punches and then  continuing diskectomy with curettes and pituitary rongeurs.  Microscope was  brought in for microdissection, and a 1 and 2 mm Kerrison punches were used  to remove the posterior disk and posterior ligament and then we decompressed  the central canal.  Centrally and to the left, a free fragment of disk under  the ligament was found, and this was  removed.  Free fragments went out  laterally, out the __________ of the nerve root and these were moved as we  decompressed the C8 root.  We did bilateral foraminotomies.  When we had  finished we had good central and lateral decompression of the cord and nerve  roots.  We removed callus endplate with a high speed drill and curette.  We  measured the disk space to be 6 mm, and the 6-mm LifeNet bone was then  tapped into place, countersunk a couple of millimeters.  We checked  posterior to the graft with a nerve hook, and there was plenty of room  between bone graft and dura.  Distraction pins were removed.  Hemostasis was  obtained with Gelfoam and thrombin.  Weight was removed from the traction.  Bone was in good position.  An Eagle anterior cervical plate was placed over  the anterior cervical spine and two screws placed in the C7 and two at T1.  These were tightened down.  Lateral x-ray was obtained showing good position  of plates and  screws, and bone plug at the 7-1 level.  The root and rotator  retractors were irrigated with antibiotic solution.  We had good hemostasis  with bilateral cauterization, Gelfoam and thrombin.  Gelfoam was irrigated  out and we had good hemostasis and the platysma was then closed with 3-0  Vicryl interrupted sutures.  The subcutaneous tissue was closed with 3-0  Vicryl interrupted sutures.  At conclusion, Benzoin, Steri-Strips and  dressing was placed.  The patient was awakened from anesthesia and placed in  a soft collar, and transferred to the recovery room in stable condition.           ______________________________  Clydene Fake, M.D.     JRH/MEDQ  D:  03/29/2006  T:  03/29/2006  Job:  [id number]

## 2011-03-09 ENCOUNTER — Ambulatory Visit (HOSPITAL_BASED_OUTPATIENT_CLINIC_OR_DEPARTMENT_OTHER)
Admission: RE | Admit: 2011-03-09 | Discharge: 2011-03-09 | Disposition: A | Discharge status: Home / Self Care | Payer: BC Managed Care – PPO | Source: Ambulatory Visit | Attending: Urology | Admitting: Urology

## 2011-03-09 DIAGNOSIS — N400 Enlarged prostate without lower urinary tract symptoms: Secondary | ICD-10-CM | POA: Insufficient documentation

## 2011-03-09 DIAGNOSIS — Z01812 Encounter for preprocedural laboratory examination: Secondary | ICD-10-CM | POA: Insufficient documentation

## 2011-03-09 DIAGNOSIS — N201 Calculus of ureter: Secondary | ICD-10-CM | POA: Insufficient documentation

## 2011-03-09 DIAGNOSIS — Z01810 Encounter for preprocedural cardiovascular examination: Secondary | ICD-10-CM | POA: Insufficient documentation

## 2011-03-09 LAB — POCT HEMOGLOBIN-HEMACUE: Hemoglobin: 16.2 g/dL (ref 13.0–17.0)

## 2011-03-15 NOTE — Op Note (Signed)
NAME:  Anthony [name], Anthony [name] NO.:  [id number]  MEDICAL RECORD NO.:  [id number]  LOCATION:                                 FACILITY:  PHYSICIAN:  Excell Seltzer. Annabell Howells, M.D.    DATE OF BIRTH:  December 17, 1950  DATE OF PROCEDURE:  03/09/2011 DATE OF DISCHARGE:                              OPERATIVE REPORT   PROCEDURES:  Cystoscopy, left retrograde pyelogram with interpretation, left ureteroscopic stone extraction with lasertripsy, insertion of left double-J stent.  PREOPERATIVE DIAGNOSIS:  Left proximal stone.  POSTOPERATIVE DIAGNOSIS:  Left proximal and distal ureteral stones.  SURGEON:  Excell Seltzer. Annabell Howells, M.D.  ANESTHESIA:  General.  SPECIMEN:  Proximal and distal ureteral stones.  DRAINS:  6-French 26-cm double-J stent.  COMPLICATIONS:  None.  INDICATIONS:  Anthony [name] is a 60 year old white male with a history of stones.  He had lithotripsy in January.  He had a postoperative hematoma and had residual hydronephrosis without obstruction on renogram. However, followup films revealed residual left proximal stone and it was felt that ureteroscopy was indicated.  FINDINGS AND PROCEDURE:  He was given Cipro and was taken operating room where general anesthetic was induced.  He was placed in lithotomy position.  His perineum and genitalia were prepped with Betadine solution.  He was draped in usual sterile fashion.  Time-out was performed.  Cystoscopy was performed with a 22-French scope and 12 degrees lens examination revealed a normal urethra.  The external sphincter was intact.  The prostatic urethra showed bilobar hyperplasia with only mild obstruction.  Examination of bladder revealed mild trabeculation.  No tumors or stones were noted.  The right ureteral orifice was unremarkable.  Left ureteral orifice had some mild erythema and was widely patent.  A 5-French open-end catheter was then passed in the left ureteral orifice and contrast was instilled.  A filling defect  was noted in the intramural ureter consistent with a stone.  I could not get the contrast above the lower proximal ureter and could not confirm the proximal stone, which was not well seen on fluoroscopy.  At this point, a 6-French short ureteroscope was passed per urethra up the left ureteral orifice without difficulty.  A stone that appeared to be 5 to 6 mm was noted in the intramural ureter.  It was grasped with a nitinol basket.  The meatus was dilated gently with the tip of the scope and the stone was removed.  At this point, the ureteroscope was re-inserted and passed up to the proximal ureter where the 5 mm obstructing stone was noted.  There was fairly significant ureteral inflammation and some blanching consistent with a prolonged presence of the stone.  It was felt that laser was indicated to reduce the size of the stone. These passed after each removal as 365 micron laser fiber was then passed through the ureteroscope.  It was set on 0.5 watts and 20 Hz and the stone was then fragmented.  A couple small chips came off the stone. The remaining large fragments felt to be small enough to basket out. The laser was removed.  A nitinol basket was re-inserted and the stone fragment was removed.  Re-inspection of the ureter  revealed no residual stone fragments.  I was able to pass the scope all the way to the renal pelvis.  At this point, a guidewire was passed to the kidney through the ureteroscope.  The scope was removed in the process.  The cystoscope was re-inserted over the wire and a 6-French 26-cm double-J stent with string was inserted without difficulty to the kidney under fluoroscopic guidance.  Wire was removed leaving good coil in the kidney and good coil in the bladder.  The bladder was drained.  The scope was removed leaving the stent string exiting urethra.  The string was secured to his penis.  B & O suppository was placed.  He was taken down from  lithotomy position.  His anesthetic was reversed.  He was moved to recovery room in stable condition.  There were no complications.     Excell Seltzer. Annabell Howells, M.D.     JJW/MEDQ  D:  03/09/2011  T:  03/09/2011  Job:  [id number]  cc:   Clydene Fake, M.D. Fax: [phone]  Windle Guard, M.D. Fax: [phone]  Electronically Signed by Bjorn Pippin M.D. on 03/15/2011 08:04:54 AM

## 2011-06-01 LAB — CARDIAC PANEL(CRET KIN+CKTOT+MB+TROPI)
CK, MB: 1.9
CK, MB: 2
CK, MB: 2.9
CK, MB: 3.6
Relative Index: 3.1 — ABNORMAL HIGH
Relative Index: INVALID
Relative Index: INVALID
Relative Index: INVALID
Total CK: 116
Total CK: 58
Total CK: 64
Total CK: 99
Troponin I: 0.02
Troponin I: 0.03
Troponin I: 0.05
Troponin I: 0.05

## 2011-06-01 LAB — BASIC METABOLIC PANEL
BUN: 19
BUN: 11
BUN: 12
BUN: 13
BUN: 14
BUN: 14
BUN: 15
BUN: 17
BUN: 21
CO2: 26
CO2: 21
CO2: 23
CO2: 24
CO2: 24
CO2: 25
CO2: 26
CO2: 26
CO2: 29
Calcium: 9.4
Calcium: 7.9 — ABNORMAL LOW
Calcium: 8.1 — ABNORMAL LOW
Calcium: 8.1 — ABNORMAL LOW
Calcium: 8.3 — ABNORMAL LOW
Calcium: 9
Calcium: 9
Calcium: 9
Calcium: 9.6
Chloride: 106
Chloride: 104
Chloride: 104
Chloride: 106
Chloride: 107
Chloride: 108
Chloride: 108
Chloride: 110
Chloride: 114 — ABNORMAL HIGH
Creatinine, Ser: 1.28
Creatinine, Ser: 1.17
Creatinine, Ser: 1.23
Creatinine, Ser: 1.27
Creatinine, Ser: 1.29
Creatinine, Ser: 1.32
Creatinine, Ser: 1.34
Creatinine, Ser: 1.45
Creatinine, Ser: 1.7 — ABNORMAL HIGH
GFR calc Af Amer: >60
GFR calc Af Amer: 51 — ABNORMAL LOW
GFR calc Af Amer: >60
GFR calc Af Amer: >60
GFR calc Af Amer: >60
GFR calc Af Amer: >60
GFR calc Af Amer: >60
GFR calc Af Amer: >60
GFR calc Af Amer: >60
GFR calc non Af Amer: 58 — ABNORMAL LOW
GFR calc non Af Amer: 42 — ABNORMAL LOW
GFR calc non Af Amer: 51 — ABNORMAL LOW
GFR calc non Af Amer: 55 — ABNORMAL LOW
GFR calc non Af Amer: 56 — ABNORMAL LOW
GFR calc non Af Amer: 58 — ABNORMAL LOW
GFR calc non Af Amer: 59 — ABNORMAL LOW
GFR calc non Af Amer: >60
GFR calc non Af Amer: >60
Glucose, Bld: 88
Glucose, Bld: 100 — ABNORMAL HIGH
Glucose, Bld: 101 — ABNORMAL HIGH
Glucose, Bld: 111 — ABNORMAL HIGH
Glucose, Bld: 118 — ABNORMAL HIGH
Glucose, Bld: 135 — ABNORMAL HIGH
Glucose, Bld: 86
Glucose, Bld: 94
Glucose, Bld: 98
Potassium: 4.4
Potassium: 3.3 — ABNORMAL LOW
Potassium: 3.3 — ABNORMAL LOW
Potassium: 3.4 — ABNORMAL LOW
Potassium: 3.5
Potassium: 3.5
Potassium: 3.6
Potassium: 4
Potassium: 4.2
Sodium: 139
Sodium: 136
Sodium: 137
Sodium: 138
Sodium: 139
Sodium: 139
Sodium: 139
Sodium: 140
Sodium: 141

## 2011-06-01 LAB — I-STAT 8, (EC8 V) (CONVERTED LAB)
BUN: 22
Bicarbonate: 22.9
Chloride: 109
Glucose, Bld: 114 — ABNORMAL HIGH
HCT: 48
Hemoglobin: 16.3
Operator id: 272551
Potassium: 4
Sodium: 138
TCO2: 24
pCO2, Ven: 33.4 — ABNORMAL LOW
pH, Ven: 7.445 — ABNORMAL HIGH

## 2011-06-01 LAB — LIPASE, BLOOD
Lipase: 26
Lipase: 32

## 2011-06-01 LAB — CULTURE, BLOOD (ROUTINE X 2)
Culture: NO GROWTH
Culture: NO GROWTH
Culture: NO GROWTH
Culture: NO GROWTH
Culture: NO GROWTH
Culture: NO GROWTH
Culture: NO GROWTH

## 2011-06-01 LAB — CBC
HCT: 42.2
HCT: 33.6 — ABNORMAL LOW
HCT: 34.6 — ABNORMAL LOW
HCT: 35 — ABNORMAL LOW
HCT: 36.7 — ABNORMAL LOW
HCT: 38.9 — ABNORMAL LOW
HCT: 39.3
HCT: 40.2
HCT: 41.9
HCT: 45.9
Hemoglobin: 14.4
Hemoglobin: 11.6 — ABNORMAL LOW
Hemoglobin: 11.7 — ABNORMAL LOW
Hemoglobin: 12 — ABNORMAL LOW
Hemoglobin: 12.4 — ABNORMAL LOW
Hemoglobin: 13.2
Hemoglobin: 13.4
Hemoglobin: 13.8
Hemoglobin: 14.1
Hemoglobin: 15.5
MCHC: 34
MCHC: 33.6
MCHC: 33.7
MCHC: 33.8
MCHC: 33.9
MCHC: 33.9
MCHC: 34.1
MCHC: 34.2
MCHC: 34.3
MCHC: 34.5
MCV: 86.7
MCV: 86.1
MCV: 86.4
MCV: 86.6
MCV: 86.8
MCV: 86.9
MCV: 87
MCV: 87.1
MCV: 87.3
MCV: 87.6
Platelets: 338
Platelets: 174
Platelets: 181
Platelets: 191
Platelets: 213
Platelets: 227
Platelets: 271
Platelets: 281
Platelets: 303
Platelets: 324
RBC: 4.87
RBC: 3.86 — ABNORMAL LOW
RBC: 3.96 — ABNORMAL LOW
RBC: 4.07 — ABNORMAL LOW
RBC: 4.22
RBC: 4.49
RBC: 4.53
RBC: 4.65
RBC: 4.78
RBC: 5.28
RDW: 13.9
RDW: 13.4
RDW: 13.4
RDW: 13.5
RDW: 13.5
RDW: 13.5
RDW: 13.5
RDW: 13.6
RDW: 13.6
RDW: 14
WBC: 7.8
WBC: 10.4
WBC: 11.8 — ABNORMAL HIGH
WBC: 12.2 — ABNORMAL HIGH
WBC: 6.7
WBC: 6.9
WBC: 7.2
WBC: 7.8
WBC: 8.9
WBC: 9.3

## 2011-06-01 LAB — URINE MICROSCOPIC-ADD ON

## 2011-06-01 LAB — DIFFERENTIAL
Basophils Absolute: 0
Basophils Absolute: 0
Basophils Absolute: 0.1
Basophils Absolute: 0.2 — ABNORMAL HIGH
Basophils Relative: 0
Basophils Relative: 0
Basophils Relative: 1
Basophils Relative: 2 — ABNORMAL HIGH
Eosinophils Absolute: 0.1
Eosinophils Absolute: 0.2
Eosinophils Absolute: 0.2
Eosinophils Absolute: 0.3
Eosinophils Relative: 2
Eosinophils Relative: 2
Eosinophils Relative: 2
Eosinophils Relative: 3
Lymphocytes Relative: 10 — ABNORMAL LOW
Lymphocytes Relative: 3 — ABNORMAL LOW
Lymphocytes Relative: 4 — ABNORMAL LOW
Lymphocytes Relative: 4 — ABNORMAL LOW
Lymphs Abs: 0.3 — ABNORMAL LOW
Lymphs Abs: 0.4 — ABNORMAL LOW
Lymphs Abs: 0.4 — ABNORMAL LOW
Lymphs Abs: 0.9
Monocytes Absolute: 0.3
Monocytes Absolute: 0.6
Monocytes Absolute: 0.7
Monocytes Absolute: 0.7
Monocytes Relative: 3
Monocytes Relative: 6
Monocytes Relative: 6
Monocytes Relative: 8
Neutro Abs: 10.6 — ABNORMAL HIGH
Neutro Abs: 10.8 — ABNORMAL HIGH
Neutro Abs: 6.8
Neutro Abs: 8.6 — ABNORMAL HIGH
Neutrophils Relative %: 77
Neutrophils Relative %: 88 — ABNORMAL HIGH
Neutrophils Relative %: 90 — ABNORMAL HIGH
Neutrophils Relative %: 93 — ABNORMAL HIGH

## 2011-06-01 LAB — URINALYSIS, ROUTINE W REFLEX MICROSCOPIC
Bilirubin Urine: NEGATIVE
Bilirubin Urine: NEGATIVE
Bilirubin Urine: NEGATIVE
Glucose, UA: NEGATIVE
Glucose, UA: NEGATIVE
Glucose, UA: NEGATIVE
Hgb urine dipstick: NEGATIVE
Hgb urine dipstick: NEGATIVE
Ketones, ur: NEGATIVE
Ketones, ur: NEGATIVE
Ketones, ur: NEGATIVE
Nitrite: NEGATIVE
Nitrite: NEGATIVE
Nitrite: NEGATIVE
Protein, ur: NEGATIVE
Protein, ur: NEGATIVE
Protein, ur: NEGATIVE
Specific Gravity, Urine: 1.014
Specific Gravity, Urine: 1.018
Specific Gravity, Urine: 1.022
Urobilinogen, UA: 1
Urobilinogen, UA: 1
Urobilinogen, UA: 1
pH: 5.5
pH: 5.5
pH: 6

## 2011-06-01 LAB — HEPATIC FUNCTION PANEL
ALT: 23
AST: 21
Albumin: 2.9 — ABNORMAL LOW
Alkaline Phosphatase: 68
Bilirubin, Direct: 0.3
Indirect Bilirubin: 0.8
Total Bilirubin: 1.1
Total Protein: 5.3 — ABNORMAL LOW

## 2011-06-01 LAB — LIPID PANEL
Cholesterol: 116
HDL: 24 — ABNORMAL LOW
LDL Cholesterol: 78
Total CHOL/HDL Ratio: 4.8
Triglycerides: 72
VLDL: 14

## 2011-06-01 LAB — POCT CARDIAC MARKERS
CKMB, poc: 1.1
CKMB, poc: <1 — ABNORMAL LOW
CKMB, poc: <1 — ABNORMAL LOW
Myoglobin, poc: 133
Myoglobin, poc: 143
Myoglobin, poc: 153
Operator id: 294511
Operator id: 294511
Troponin i, poc: <0.05
Troponin i, poc: <0.05
Troponin i, poc: <0.05

## 2011-06-01 LAB — URINE CULTURE
Colony Count: 5000
Colony Count: NO GROWTH
Colony Count: NO GROWTH
Culture: NO GROWTH
Culture: NO GROWTH

## 2011-06-01 LAB — CK TOTAL AND CKMB (NOT AT ARMC)
CK, MB: 1.4
CK, MB: 1.5
Relative Index: INVALID
Relative Index: INVALID
Total CK: 68
Total CK: 86

## 2011-06-01 LAB — PROTIME-INR
INR: 1
INR: 1.1
Prothrombin Time: 13.1
Prothrombin Time: 14.2

## 2011-06-01 LAB — D-DIMER, QUANTITATIVE (NOT AT ARMC): D-Dimer, Quant: 1.51 — ABNORMAL HIGH

## 2011-06-01 LAB — C-REACTIVE PROTEIN: CRP: 6 — ABNORMAL HIGH (ref <0.6)

## 2011-06-01 LAB — HEPARIN LEVEL (UNFRACTIONATED)
Heparin Unfractionated: 0.15 — ABNORMAL LOW
Heparin Unfractionated: 0.18 — ABNORMAL LOW
Heparin Unfractionated: 0.35
Heparin Unfractionated: 0.51

## 2011-06-01 LAB — TROPONIN I
Troponin I: 0.01
Troponin I: 0.03

## 2011-06-01 LAB — B-NATRIURETIC PEPTIDE (CONVERTED LAB): Pro B Natriuretic peptide (BNP): 195 — ABNORMAL HIGH

## 2011-06-01 LAB — APTT: aPTT: 26

## 2011-06-01 LAB — TSH: TSH: 1.391

## 2011-06-01 LAB — POCT I-STAT CREATININE
Creatinine, Ser: 2 — ABNORMAL HIGH
Operator id: 272551

## 2011-06-01 LAB — MAGNESIUM: Magnesium: 1.8

## 2011-06-01 LAB — PHOSPHORUS: Phosphorus: 2.4

## 2011-06-01 LAB — SEDIMENTATION RATE: Sed Rate: 21 — ABNORMAL HIGH

## 2011-06-01 LAB — HOMOCYSTEINE: Homocysteine: 8.6

## 2014-01-08 ENCOUNTER — Other Ambulatory Visit: Payer: Self-pay | Admitting: Urology

## 2014-01-09 ENCOUNTER — Encounter (HOSPITAL_COMMUNITY): Payer: Self-pay | Admitting: Pharmacy Technician

## 2014-01-15 ENCOUNTER — Encounter (HOSPITAL_COMMUNITY): Payer: Self-pay | Admitting: *Deleted

## 2014-01-16 NOTE — H&P (Signed)
Problems   1. Abdominal pain, RLQ (right lower quadrant) (789.03)  2. Bilateral kidney stones (592.0)  3. Calculus of right ureter (592.1)  4. Gross hematuria (599.71)  5. 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 Kidney stone on left side (592.0)  7. Kidney stone on right side (592.0)  History of Present Illness     Anthony [name] returns today to discuss treatment options for his 9x597mm right proximal ureteral stone.  He has had some pain today but no voiding complaints.  He has some hematuria.  He has no nausea of vomiting.  He also has a 19x815mm RLP stone and 3 stones on the left.   Past Medical History Problems   1. History of Arthritis (V13.4)  2. History of Calculus of right ureter (592.1)  3. History of Calculus of ureter (592.1)  4. History of Hematoma Of The Left Kidney (866.01)  5. History of acute pyelonephritis (V13.02)  6. History of kidney stones (V13.01)  7. History of Hydronephrosis, right (591)  8. History of Lumbar radiculopathy (724.4)  9. History of Microscopic hematuria (599.72)  10. History of Ureteral stricture (593.3)  Surgical History Problems   1. History of Arm Incision  2. History of Back Surgery  3. History of Cystoscopy With Insertion Of Ureteral Stent Left  4. History of Cystoscopy With Insertion Of Ureteral Stent Right  5. History of Cystoscopy With Ureteroscopy Right  6. History of Cystoscopy With Ureteroscopy With Lithotripsy  7. History of Cystoscopy With Ureteroscopy With Removal Of Calculus  8. History of Lithotripsy  9. History of Lithotripsy  10. History of Lower Back Surgery  11. History of Neck Surgery  Current Meds  1. HYDROmorphone HCl - 2 MG Oral Tablet; TAKE 1 TABLET EVERY 4 TO 6 HOURS AS  NEEDED FOR PAIN;  Therapy: 14May2015 to (Evaluate:18May2015); Last Rx:14May2015 Ordered  2. Naproxen 500 MG Oral Tablet; TAKE TABLET  PRN;  Therapy: (Recorded:14May2015) to Recorded  3. Omeprazole CPDR;   Therapy: (Recorded:14May2015) to Recorded  Allergies Medication   1. Codeine Derivatives  2. Doxycycline Hyclate CAPS  Family History Problems   1. Family history of Colon Cancer (V16.0) : Mother  2. Family history of Colon Cancer (V16.0) : Maternal Aunt  3. Family history of Colon Cancer (V16.0) : Maternal Aunt  4. Family history of Colon Cancer (V16.0) : Maternal Uncle  5. Family history of Death In The Family Aunt : Maternal Aunt   died age 36-colon cancer  6. Family history of Death In The Family Aunt : Maternal Aunt   died age -5461 colon cancer  7. Family history of Death In The Family Father   died age 38-mini strokes  8. Family history of Death In The Family Mother   died age 3-colon cancer  459. Family history of Family Health Status Number Of Children   1 daughter and 2 sons  3610. Family history of Hematuria : Mother  1711. Family history of Nephrolithiasis : Mother  7212. Family history of Stroke Syndrome (V17.1) : Mother  10713. Family history of Stroke Syndrome (V17.1) : Father  Social History Problems   1. Being A Social Drinker  2. Caffeine Use  3. Former Smoker   smoked a pipe for about 3 yrs and quit 4 yrs  4. Marital History - Widowed  5. Occupation:   truck Fish farm managerdriver/labor  Review of Systems  Genitourinary: hematuria, but no feelings of urinary urgency.  Gastrointestinal: flank pain.  Constitutional: no fever.  Cardiovascular: no chest pain.  Respiratory: no shortness of breath.    Vitals Vital Signs [Data Includes: Last 1 Day]  Recorded: 18May2015 04:16PM  Blood Pressure: 153 / 91 Temperature: 97.8 F Heart Rate: 61  Physical Exam Constitutional: Well nourished and well developed . No acute distress.  Pulmonary: No respiratory distress and normal respiratory rhythm and effort.  Cardiovascular: Heart rate and rhythm are normal . No peripheral edema.    Results/Data Urine [Data Includes: Last 1 Day]   18May2015  COLOR YELLOW   APPEARANCE CLEAR    SPECIFIC GRAVITY 1.025   pH 6.5   GLUCOSE NEG mg/dL  BILIRUBIN NEG   KETONE NEG mg/dL  BLOOD LARGE   PROTEIN TRACE mg/dL  UROBILINOGEN 1 mg/dL  NITRITE NEG   LEUKOCYTE ESTERASE NEG   SQUAMOUS EPITHELIAL/HPF RARE   WBC 3-6 WBC/hpf  RBC 21-50 RBC/hpf  BACTERIA RARE   CRYSTALS NONE SEEN   CASTS NONE SEEN   Other MUCUS NOTED    The following images/tracing/specimen were independently visualized:  I have reviewed his KUB.  The following clinical lab reports were reviewed:  UA reviewed.    Assessment Assessed   1. Calculus of right ureter (592.1)  2. Bilateral kidney stones (592.0)   He has a 13mm RUPJ stone and bilateral renal stones.   Plan Health Maintenance   1. UA With REFLEX; [Do Not Release]; Status:Resulted - Requires Verification;   Done:  18May2015 04:05PM   I discussed the options including PCNL to try to clean out the whole right kidney and ureter vs ESWL of the sypmtomatic stone. He would prefer to go with ESWL and is aware of the risks.   Discussion/Summary  CC: Dr. Windle Guard.

## 2014-01-17 ENCOUNTER — Encounter (HOSPITAL_COMMUNITY): Payer: Self-pay

## 2014-01-17 ENCOUNTER — Encounter (HOSPITAL_COMMUNITY)
Admission: RE | Disposition: A | Discharge status: Home / Self Care | Payer: Self-pay | Source: Ambulatory Visit | Attending: Urology

## 2014-01-17 ENCOUNTER — Ambulatory Visit (HOSPITAL_COMMUNITY): Payer: BC Managed Care – PPO

## 2014-01-17 ENCOUNTER — Ambulatory Visit (HOSPITAL_COMMUNITY)
Admission: RE | Admit: 2014-01-17 | Discharge: 2014-01-17 | Disposition: A | Discharge status: Home / Self Care | Payer: BC Managed Care – PPO | Source: Ambulatory Visit | Attending: Urology | Admitting: Urology

## 2014-01-17 DIAGNOSIS — Z87891 Personal history of nicotine dependence: Secondary | ICD-10-CM | POA: Insufficient documentation

## 2014-01-17 DIAGNOSIS — N200 Calculus of kidney: Secondary | ICD-10-CM | POA: Insufficient documentation

## 2014-01-17 DIAGNOSIS — Z79899 Other long term (current) drug therapy: Secondary | ICD-10-CM | POA: Insufficient documentation

## 2014-01-17 DIAGNOSIS — N1330 Unspecified hydronephrosis: Secondary | ICD-10-CM | POA: Insufficient documentation

## 2014-01-17 DIAGNOSIS — N201 Calculus of ureter: Secondary | ICD-10-CM | POA: Insufficient documentation

## 2014-01-17 HISTORY — DX: Unspecified osteoarthritis, unspecified site: M19.90

## 2014-01-17 HISTORY — DX: Gastro-esophageal reflux disease without esophagitis: K21.9

## 2014-01-17 HISTORY — DX: Personal history of other diseases of the circulatory system: Z86.79

## 2014-01-17 SURGERY — LITHOTRIPSY, ESWL
Anesthesia: LOCAL

## 2014-01-17 MED ORDER — DIAZEPAM 5 MG PO TABS
10.0000 mg | ORAL_TABLET | ORAL | Status: AC
  Administered 2014-01-17: 10 mg via ORAL
  Filled 2014-01-17: qty 2

## 2014-01-17 MED ORDER — DIPHENHYDRAMINE HCL 25 MG PO CAPS
25.0000 mg | ORAL_CAPSULE | ORAL | Status: AC
  Administered 2014-01-17: 25 mg via ORAL
  Filled 2014-01-17: qty 1

## 2014-01-17 MED ORDER — CIPROFLOXACIN HCL 500 MG PO TABS
500.0000 mg | ORAL_TABLET | ORAL | Status: AC
  Administered 2014-01-17: 500 mg via ORAL
  Filled 2014-01-17: qty 1

## 2014-01-17 MED ORDER — DEXTROSE-NACL 5-0.45 % IV SOLN
INTRAVENOUS | Status: DC
  Administered 2014-01-17: 10:00:00 via INTRAVENOUS

## 2014-01-17 NOTE — Discharge Instructions (Addendum)
Lithotripsy, Care After °Refer to this sheet in the next few weeks. These instructions provide you with information on caring for yourself after your procedure. Your health care provider may also give you more specific instructions. Your treatment has been planned according to current medical practices, but problems sometimes occur. Call your health care provider if you have any problems or questions after your procedure. °WHAT TO EXPECT AFTER THE PROCEDURE  °· Your urine may have a red tinge for a few days after treatment. Blood loss is usually minimal. °· You may have soreness in the back or flank area. This usually goes away after a few days. The procedure can cause blotches or bruises on the back where the pressure wave enters the skin. These marks usually cause only minimal discomfort and should disappear in a short time. °· Stone fragments should begin to pass within 24 hours of treatment. However, a delayed passage is not unusual. °· You may have pain, discomfort, and feel sick to your stomach (nauseated) when the crushed fragments of stone are passed down the tube from the kidney to the bladder. Stone fragments can pass soon after the procedure and may last for up to 4 8 weeks. °· A small number of patients may have severe pain when stone fragments are not able to pass, which leads to an obstruction. °· If your stone is greater than 1 inch (2.5 cm) in diameter or if you have multiple stones that have a combined diameter greater than 1 inch (2.5 cm), you may require more than one treatment. °· If you had a stent placed prior to your procedure, you may experience some discomfort, especially during urination. You may experience the pain or discomfort in your flank or back, or you may experience a sharp pain or discomfort at the base of your penis or in your lower abdomen. The discomfort usually lasts only a few minutes after urinating. °HOME CARE INSTRUCTIONS  °· Rest at home until you feel your energy  improving. °· Only take over-the-counter or prescription medicines for pain, discomfort, or fever as directed by your health care provider. Depending on the type of lithotripsy, you may need to take antibiotics and anti-inflammatory medicines for a few days. °· Drink enough water and fluids to keep your urine clear or pale yellow. This helps "flush" your kidneys. It helps pass any remaining pieces of stone and prevents stones from coming back. °· Most people can resume daily activities within 1 2 days after standard lithotripsy. It can take longer to recover from laser and percutaneous lithotripsy. °· If the stones are in your urinary system, you may be asked to strain your urine at home to look for stones. Any stones that are found can be sent to a medical lab for examination. °· Visit your health care provider for a follow-up appointment in a few weeks. Your doctor may remove your stent if you have one. Your health care provider will also check to see whether stone particles still remain. °SEEK MEDICAL CARE IF:  °· Your pain is not relieved by medicine. °· You have a lasting nauseous feeling. °· You feel there is too much blood in the urine. °· You develop persistent problems with frequent or painful urination that does not at least partially improve after 2 days following the procedure. °· You have a congested cough. °· You feel lightheaded. °· You develop a rash or any other signs that might suggest an allergic problem. °· You develop any reaction or side   effects to your medicine(s). SEEK IMMEDIATE MEDICAL CARE IF:   You experience severe back or flank pain or both.  You see nothing but blood when you urinate.  You cannot pass any urine at all.  You have a fever or shaking chills.  You develop shortness of breath, difficulty breathing, or chest pain.  You develop vomiting that will not stop after 6 8 hours.  You have a fainting episode. Document Released: 08/29/2007 Document Revised: 05/30/2013  Document Reviewed: 02/22/2013 [facility] Patient Information 2014 [place], [place].   You can resume naproxyn in 48 hrs if needed.

## 2014-01-24 ENCOUNTER — Encounter (HOSPITAL_BASED_OUTPATIENT_CLINIC_OR_DEPARTMENT_OTHER): Payer: Self-pay | Admitting: *Deleted

## 2014-01-24 ENCOUNTER — Encounter (HOSPITAL_BASED_OUTPATIENT_CLINIC_OR_DEPARTMENT_OTHER)
Admission: RE | Disposition: A | Discharge status: Home / Self Care | Payer: Self-pay | Source: Ambulatory Visit | Attending: Urology

## 2014-01-24 ENCOUNTER — Ambulatory Visit (HOSPITAL_BASED_OUTPATIENT_CLINIC_OR_DEPARTMENT_OTHER): Payer: BC Managed Care – PPO | Admitting: Anesthesiology

## 2014-01-24 ENCOUNTER — Other Ambulatory Visit: Payer: Self-pay | Admitting: Urology

## 2014-01-24 ENCOUNTER — Ambulatory Visit (HOSPITAL_COMMUNITY): Payer: BC Managed Care – PPO

## 2014-01-24 ENCOUNTER — Ambulatory Visit (HOSPITAL_BASED_OUTPATIENT_CLINIC_OR_DEPARTMENT_OTHER)
Admission: RE | Admit: 2014-01-24 | Discharge: 2014-01-24 | Disposition: A | Discharge status: Home / Self Care | Payer: BC Managed Care – PPO | Source: Ambulatory Visit | Attending: Urology | Admitting: Urology

## 2014-01-24 ENCOUNTER — Encounter (HOSPITAL_BASED_OUTPATIENT_CLINIC_OR_DEPARTMENT_OTHER): Payer: BC Managed Care – PPO | Admitting: Anesthesiology

## 2014-01-24 DIAGNOSIS — Z87891 Personal history of nicotine dependence: Secondary | ICD-10-CM | POA: Insufficient documentation

## 2014-01-24 DIAGNOSIS — IMO0002 Reserved for concepts with insufficient information to code with codable children: Secondary | ICD-10-CM | POA: Insufficient documentation

## 2014-01-24 DIAGNOSIS — Z800 Family history of malignant neoplasm of digestive organs: Secondary | ICD-10-CM | POA: Insufficient documentation

## 2014-01-24 DIAGNOSIS — Z79899 Other long term (current) drug therapy: Secondary | ICD-10-CM | POA: Insufficient documentation

## 2014-01-24 DIAGNOSIS — N201 Calculus of ureter: Secondary | ICD-10-CM | POA: Insufficient documentation

## 2014-01-24 DIAGNOSIS — Z87442 Personal history of urinary calculi: Secondary | ICD-10-CM | POA: Insufficient documentation

## 2014-01-24 DIAGNOSIS — Z885 Allergy status to narcotic agent status: Secondary | ICD-10-CM | POA: Insufficient documentation

## 2014-01-24 DIAGNOSIS — M129 Arthropathy, unspecified: Secondary | ICD-10-CM | POA: Insufficient documentation

## 2014-01-24 DIAGNOSIS — Z881 Allergy status to other antibiotic agents status: Secondary | ICD-10-CM | POA: Insufficient documentation

## 2014-01-24 DIAGNOSIS — K219 Gastro-esophageal reflux disease without esophagitis: Secondary | ICD-10-CM | POA: Insufficient documentation

## 2014-01-24 DIAGNOSIS — N400 Enlarged prostate without lower urinary tract symptoms: Secondary | ICD-10-CM | POA: Insufficient documentation

## 2014-01-24 HISTORY — PX: CYSTOSCOPY WITH RETROGRADE PYELOGRAM, URETEROSCOPY AND STENT PLACEMENT: SHX5789

## 2014-01-24 LAB — POCT I-STAT, CHEM 8
BUN: 27 mg/dL — ABNORMAL HIGH (ref 6–23)
Calcium, Ion: 1.24 mmol/L (ref 1.13–1.30)
Chloride: 102 mEq/L (ref 96–112)
Creatinine, Ser: 2.2 mg/dL — ABNORMAL HIGH (ref 0.50–1.35)
Glucose, Bld: 95 mg/dL (ref 70–99)
HCT: 48 % (ref 39.0–52.0)
Hemoglobin: 16.3 g/dL (ref 13.0–17.0)
Potassium: 4.1 mEq/L (ref 3.7–5.3)
Sodium: 141 mEq/L (ref 137–147)
TCO2: 23 mmol/L (ref 0–100)

## 2014-01-24 SURGERY — CYSTOURETEROSCOPY, WITH RETROGRADE PYELOGRAM AND STENT INSERTION
Anesthesia: General | Site: Ureter | Laterality: Right

## 2014-01-24 MED ORDER — CIPROFLOXACIN IN D5W 400 MG/200ML IV SOLN
400.0000 mg | INTRAVENOUS | Status: AC
  Administered 2014-01-24: 400 mg via INTRAVENOUS
  Filled 2014-01-24: qty 200

## 2014-01-24 MED ORDER — FENTANYL CITRATE 0.05 MG/ML IJ SOLN
INTRAMUSCULAR | Status: AC
  Filled 2014-01-24: qty 6

## 2014-01-24 MED ORDER — DEXAMETHASONE SODIUM PHOSPHATE 4 MG/ML IJ SOLN
INTRAMUSCULAR | Status: DC | PRN
  Administered 2014-01-24: 10 mg via INTRAVENOUS

## 2014-01-24 MED ORDER — MIDAZOLAM HCL 5 MG/5ML IJ SOLN
INTRAMUSCULAR | Status: DC | PRN
  Administered 2014-01-24: 2 mg via INTRAVENOUS

## 2014-01-24 MED ORDER — LIDOCAINE HCL 2 % EX GEL
CUTANEOUS | Status: DC | PRN
  Administered 2014-01-24: 1 via URETHRAL

## 2014-01-24 MED ORDER — STERILE WATER FOR IRRIGATION IR SOLN
Status: DC | PRN
Start: 2014-01-24 — End: 2014-01-24
  Administered 2014-01-24: 500 mL/h

## 2014-01-24 MED ORDER — LACTATED RINGERS IV SOLN
INTRAVENOUS | Status: DC
  Administered 2014-01-24: 14:00:00 via INTRAVENOUS
  Filled 2014-01-24: qty 1000

## 2014-01-24 MED ORDER — FENTANYL CITRATE 0.05 MG/ML IJ SOLN
25.0000 ug | INTRAMUSCULAR | Status: DC | PRN
  Filled 2014-01-24: qty 1

## 2014-01-24 MED ORDER — FENTANYL CITRATE 0.05 MG/ML IJ SOLN
INTRAMUSCULAR | Status: DC | PRN
  Administered 2014-01-24: 25 ug via INTRAVENOUS
  Administered 2014-01-24: 50 ug via INTRAVENOUS
  Administered 2014-01-24: 25 ug via INTRAVENOUS

## 2014-01-24 MED ORDER — ONDANSETRON HCL 4 MG/2ML IJ SOLN
INTRAMUSCULAR | Status: DC | PRN
  Administered 2014-01-24: 4 mg via INTRAVENOUS

## 2014-01-24 MED ORDER — SODIUM CHLORIDE 0.9 % IR SOLN
Status: DC | PRN
  Administered 2014-01-24: 3000 mL via INTRAVESICAL

## 2014-01-24 MED ORDER — IOHEXOL 350 MG/ML SOLN
INTRAVENOUS | Status: DC | PRN
  Administered 2014-01-24: 26 mL via URETHRAL

## 2014-01-24 MED ORDER — HYDROMORPHONE HCL 2 MG PO TABS
ORAL_TABLET | ORAL | Status: AC
  Filled 2014-01-24: qty 1

## 2014-01-24 MED ORDER — HYDROMORPHONE HCL 2 MG PO TABS
2.0000 mg | ORAL_TABLET | ORAL | Status: DC | PRN
  Administered 2014-01-24: 2 mg via ORAL
  Filled 2014-01-24: qty 1

## 2014-01-24 MED ORDER — PROPOFOL 10 MG/ML IV BOLUS
INTRAVENOUS | Status: DC | PRN
Start: 2014-01-24 — End: 2014-01-24
  Administered 2014-01-24: 180 mg via INTRAVENOUS

## 2014-01-24 MED ORDER — MIDAZOLAM HCL 2 MG/2ML IJ SOLN
INTRAMUSCULAR | Status: AC
  Filled 2014-01-24: qty 2

## 2014-01-24 MED ORDER — LIDOCAINE HCL (CARDIAC) 20 MG/ML IV SOLN
INTRAVENOUS | Status: DC | PRN
Start: 2014-01-24 — End: 2014-01-24
  Administered 2014-01-24: 60 mg via INTRAVENOUS

## 2014-01-24 MED ORDER — LACTATED RINGERS IV SOLN
INTRAVENOUS | Status: DC
  Administered 2014-01-24: 17:00:00 via INTRAVENOUS
  Filled 2014-01-24: qty 1000

## 2014-01-24 SURGICAL SUPPLY — 22 items
6X24 CONTOUR ×3 IMPLANT
BAG DRAIN URO-CYSTO SKYTR STRL (DRAIN) ×3 IMPLANT
BASKET ZERO TIP NITINOL 2.4FR (BASKET) ×3 IMPLANT
CANISTER SUCT LVC 12 LTR MEDI- (MISCELLANEOUS) ×3 IMPLANT
CATH INTERMIT  6FR 70CM (CATHETERS) ×3 IMPLANT
CATH URET 5FR 28IN CONE TIP (BALLOONS) ×2
CATH URET 5FR 70CM CONE TIP (BALLOONS) ×1 IMPLANT
CLOTH BEACON ORANGE TIMEOUT ST (SAFETY) ×3 IMPLANT
DRAPE CAMERA CLOSED 9X96 (DRAPES) ×3 IMPLANT
FIBER LASER TRAC TIP (UROLOGICAL SUPPLIES) ×3 IMPLANT
GLOVE BIO SURGEON STRL SZ7 (GLOVE) ×3 IMPLANT
GLOVE BIOGEL M STER SZ 6 (GLOVE) ×3 IMPLANT
GLOVE INDICATOR 6.5 STRL GRN (GLOVE) ×3 IMPLANT
GOWN STRL REUS W/TWL LRG LVL3 (GOWN DISPOSABLE) ×3 IMPLANT
GOWN STRL REUS W/TWL XL LVL3 (GOWN DISPOSABLE) ×3 IMPLANT
GUIDEWIRE 0.038 PTFE COATED (WIRE) IMPLANT
GUIDEWIRE ANG ZIPWIRE 038X150 (WIRE) IMPLANT
GUIDEWIRE STR DUAL SENSOR (WIRE) ×3 IMPLANT
IV NS IRRIG 3000ML ARTHROMATIC (IV SOLUTION) ×3 IMPLANT
PACK CYSTOSCOPY (CUSTOM PROCEDURE TRAY) ×3 IMPLANT
STENT URET 6FRX24 CONTOUR (STENTS) ×3 IMPLANT
WATER STERILE IRR 500ML POUR (IV SOLUTION) ×3 IMPLANT

## 2014-01-24 NOTE — H&P (Signed)
Active Problems Problems  1. Gross hematuria (599.71)   Assessed By: Jimmey Ralph (Urology); Last Assessed: 24 Jan 2014 2. Nausea (787.02)   Assessed By: Jimmey Ralph (Urology); Last Assessed: 24 Jan 2014 3. Rt flank pain (789.09)   Assessed By: Jimmey Ralph (Urology); Last Assessed: 24 Jan 2014  History of Present Illness 63 YO male patient of Dr. Ralene Muskrat seen today as a work-in for pain and nausea. S/P (R) ESWL 5/218/15.  GU HX:  Last seen 01/07/14 to discuss treatment options for his 9 x 7 mm right proximal ureteral stone. He has some hematuria. He also has a 19 x 15 mm RLP stone and 3 stones on the left.   Interval HX:  Has passed 6-7 small stone fragments. Today though right flank pain is 10+/10 w/o relief with Hydromorphone 2 mg 4 hrs ago. Pain has also been so bad he has nausea. Denies f/c or vomiting but has redeveloped gross hematuria.   Past Medical History Problems  1. History of Arthritis (V13.4) 2. History of Calculus of right ureter (592.1) 3. History of Calculus of ureter (592.1) 4. History of Hematoma Of The Left Kidney (866.01) 5. History of acute pyelonephritis (V13.02) 6. History of kidney stones (V13.01) 7. History of Hydronephrosis, right (591) 8. History of Lumbar radiculopathy (724.4) 9. History of Microscopic hematuria (599.72) 10. 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 History of Arm Incision 2. History of Back Surgery 3. History of Cystoscopy With Insertion Of Ureteral Stent Left 4. History of Cystoscopy With Insertion Of Ureteral Stent Right 5. History of Cystoscopy With Ureteroscopy Right 6. History of Cystoscopy With Ureteroscopy With Lithotripsy 7. History of Cystoscopy With Ureteroscopy With Removal Of Calculus 8. History of Lithotripsy 9. History of Lithotripsy 10. History of Lithotripsy 11. History of Lower Back Surgery 12. History of Neck Surgery  Current Meds 1. HYDROmorphone HCl - 2 MG Oral  Tablet; TAKE 1 TABLET EVERY 4 TO 6 HOURS AS  NEEDED FOR PAIN;  Therapy: [id number] to (Evaluate:18May2015); Last Rx:14May2015 Ordered 2. Naproxen 500 MG Oral Tablet; TAKE TABLET  PRN;  Therapy: (Recorded:14May2015) to Recorded 3. Omeprazole CPDR;  Therapy: (Recorded:14May2015) to Recorded 4. Tamsulosin HCl - 0.4 MG Oral Capsule; TAKE 1 CAPSULE Daily;  Therapy: [id number] to (Evaluate:01Jul2015)  Requested for: [id number]; Last  Rx:01Jun2015 Ordered  Allergies Medication  1. Codeine Derivatives 2. Doxycycline Hyclate CAPS  Family History Problems  1. Family history of Colon Cancer (V16.0) : Mother 2. Family history of Colon Cancer (V16.0) : Maternal Aunt 3. Family history of Colon Cancer (V16.0) : Maternal Aunt 4. Family history of Colon Cancer (V16.0) : Maternal Uncle 5. Family history of Death In The Family Aunt : Maternal Aunt   died age 45-colon cancer 26. Family history of Death In The Family Aunt : Maternal Aunt   died age -65 colon cancer 87. Family history of Death In The Family Father   died age 70-mini strokes 75. Family history of Death In The Family Mother   died age 44-colon cancer 44. Family history of Family Health Status Number Of Children   1 daughter and 2 sons 9. Family history of Hematuria : Mother 62. Family history of Nephrolithiasis : Mother 72. Family history of Stroke Syndrome (V17.1) : Mother 68. Family history of Stroke Syndrome (V17.1) : Father  Social History Problems  1. Being A Social Drinker 2. Caffeine Use 3. Former Smoker   smoked a pipe for about 3 yrs and quit 4 yrs 4.  Marital History - Widowed 5. Occupation:   truck Automotive engineer  Review of Systems Genitourinary, constitutional, skin, eye, otolaryngeal, hematologic/lymphatic, cardiovascular, pulmonary, endocrine, musculoskeletal, gastrointestinal, neurological and psychiatric system(s) were reviewed and pertinent findings if present are noted.  Gastrointestinal: nausea and flank  pain.    Vitals Vital Signs [Data Includes: Last 1 Day]  Recorded: [id number] 11:32AM  Blood Pressure: 146 / 86 Temperature: 97.9 F Heart Rate: 72  Physical Exam Constitutional: Well nourished and well developed . No acute distress. The patient appears well hydrated.  ENT:1 . The ears and nose are normal in appearance1 .  Neck:1  The appearance of the neck is normal1  and no neck mass is present1 .  Pulmonary:1  No respiratory distress1  and normal respiratory rhythm and effort1 .  Cardiovascular:1  Heart rate and rhythm are normal1  . No peripheral edema.1 .  Abdomen: The abdomen is flat. The abdomen is soft and nontender. No masses are palpated1  Moderate tenderness in the RLQ is present, but no LLQ tenderness. Right CVA tenderness and no left CVA tenderness no CVA tenderness1 . No hernias are palpable1  No hepatosplenomegaly noted1   Rectal:  Genitourinary: Examination of the penis demonstrates1  no discharge1 , no masses1 , no lesions1  and a normal meatus1 . The scrotum is1  without lesions1 . The right epididymis is1  palpably normal1  and non-tender1 . The left epididymis is1  palpably normal1  and non-tender1 . The right testis is1  non-tender1  and without masses1 . The left testis is1  non-tender1  and without masses1 .  Lymphatics: The  femoral1  and  inguinal1  nodes are not enlarged or tender1 .  Skin:1  Normal skin turgor1 , no visible rash1  and no visible skin lesions1 .  Neuro/Psych:1 . Mood and affect are appropriate1 .     1 Amended By: Lowella Bandy; Jan 24 2014 2:16 PM EST  Results/Data Urine [Data Includes: Last 1 Day]   [id number]  COLOR RED   APPEARANCE CLOUDY   SPECIFIC GRAVITY 1.020   pH 6.0   GLUCOSE NEG mg/dL  BILIRUBIN NEG   KETONE NEG mg/dL  BLOOD LARGE   PROTEIN TRACE mg/dL  UROBILINOGEN 0.2 mg/dL  NITRITE NEG   LEUKOCYTE ESTERASE NEG   SQUAMOUS EPITHELIAL/HPF NONE SEEN   WBC NONE SEEN WBC/hpf  RBC TNTC RBC/hpf  BACTERIA MODERATE   CRYSTALS NONE  SEEN   CASTS NONE SEEN    The following images/tracing/specimen were independently visualized:  KUB: shows small scattered stone fragments w/in right renal pelvis. There is gas/stool over lying right upper ureter which makes it difficult to assess if stone fragments still present but on RUS had dilated proximal ureter. There are some small distal calcifications noted near bladder. This KUB was reviewed by Dr. Jeffie Pollock and Dr. Janice Norrie. RUS: (limited: right: 14.05 X 1.12 X 6.10 X 5.82 cm. Moderate hydronephrosis and proximal hydroureter. Several hyperechoic areas noted w/in renal pelvis.  The following clinical lab reports were reviewed:  UA- TNTC RBC and few bacteria. Will culture.  PVR: Ultrasound PVR 56.21 ml.    Assessment Assessed  1. Rt flank pain (789.09) 2. Nausea (787.02) 3. Gross hematuria (599.71)  Plan  Health Maintenance  1. UA With REFLEX; [Do Not Release]; Status:Complete;   Done: [id number] 11:20AM Rt flank pain  2. Administered: Ketorolac Tromethamine 60 MG/2ML Injection Solution 3. KUB; Status:Complete;   Done: [id number] 12:00AM 4. RENAL U/S RIGHT; Status:Resulted - Requires Verification;   Done:  [id number] 12:00AM 5. Stone Analysis; Status:In Progress - Specimen/Data Collected;   Done: [id number]  Ketorolac 60 mg IM today Culture urine Stone fragments sent for analysis   Discussed with pt (1) allow more time for passage of stone fragments since pain is now controlled with Ketorolac or (2) proceed with cystourethroscopy, R RPG, stone extraction, and possible double J stent placement by Dr. Janice Norrie (on-call). He and wife both wish to proceed with surgical intervention at this time. Risks and benefits discussed which include: hematuria, infection, risk of injury to bladder or ureter, and general anesthesia. Voices understanding. All questions addressed and answered to their satisfaction   URINE CULTURE; Status:In Progress - Specimen/Data Collected;  Done: [id number] Perform:[facility];  OUZ:[id number]; Marked Important; Last Updated XA:JLUNGB, Debbie; 01/24/2014 12:04:58 PM;Ordered; Today;  MBO:MQTTC hematuria, Rt flank pain; Ordered NG:FREVQW, Diane;

## 2014-01-24 NOTE — Anesthesia Preprocedure Evaluation (Addendum)
Anesthesia Evaluation  Patient identified by MRN, date of birth, ID band Patient awake    Reviewed: Allergy & Precautions, H&P , NPO status , Patient's Chart, lab work & pertinent test results  Airway Mallampati: II TM Distance: >3 FB Neck ROM: full    Dental  (+) Poor Dentition, Missing, Dental Advisory Given Many missing teeth :   Pulmonary neg pulmonary ROS, former smoker,  breath sounds clear to auscultation  Pulmonary exam normal       Cardiovascular Exercise Tolerance: Good + Valvular Problems/Murmurs MVP Rhythm:regular Rate:Normal     Neuro/Psych Cervical fusion negative neurological ROS  negative psych ROS   GI/Hepatic negative GI ROS, Neg liver ROS, GERD-  Medicated and Controlled,  Endo/Other  negative endocrine ROS  Renal/GU Renal Insufficiency and ARFRenal diseasenegative Renal ROSCRT 2.2   negative genitourinary   Musculoskeletal   Abdominal   Peds  Hematology negative hematology ROS (+)   Anesthesia Other Findings   Reproductive/Obstetrics negative OB ROS                          Anesthesia Physical Anesthesia Plan  ASA: II  Anesthesia Plan: General   Post-op Pain Management:    Induction: Intravenous  Airway Management Planned: LMA  Additional Equipment:   Intra-op Plan:   Post-operative Plan:   Informed Consent: I have reviewed the patients History and Physical, chart, labs and discussed the procedure including the risks, benefits and alternatives for the proposed anesthesia with the patient or authorized representative who has indicated his/her understanding and acceptance.   Dental Advisory Given  Plan Discussed with: CRNA and Surgeon  Anesthesia Plan Comments:         Anesthesia Quick Evaluation

## 2014-01-24 NOTE — Discharge Instructions (Signed)
Post Anesthesia Home Care Instructions  Activity: Get plenty of rest for the remainder of the day. A responsible adult should stay with you for 24 hours following the procedure.  For the next 24 hours, DO NOT: -Drive a car -Operate machinery -Drink alcoholic beverages -Take any medication unless instructed by your physician -Make any legal decisions or sign important papers.  Meals: Start with liquid foods such as gelatin or soup. Progress to regular foods as tolerated. Avoid greasy, spicy, heavy foods. If nausea and/or vomiting occur, drink only clear liquids until the nausea and/or vomiting subsides. Call your physician if vomiting continues.  Special Instructions/Symptoms: Your throat may feel dry or sore from the anesthesia or the breathing tube placed in your throat during surgery. If this causes discomfort, gargle with warm salt water. The discomfort should disappear within 24 hours.      [facility] [phone] Post Ureteroscopy With or Without Stent Instructions  Definitions:  Ureter: The duct that transports urine from the kidney to the bladder. Stent:   A plastic hollow tube that is placed into the ureter, from the kidney to the bladder to prevent the ureter from swelling shut.  GENERAL INSTRUCTIONS:  Despite the fact that no skin incisions were used, the area around the ureter and bladder is raw and irritated. The stent is a foreign body which will further irritate the bladder wall. This irritation is manifested by increased frequency of urination, both day and night, and by an increase in the urge to urinate. In some, the urge to urinate is present almost always. Sometimes the urge is strong enough that you may not be able to stop yourself from urinating. The only real cure is to remove the stent and then give time for the bladder wall to heal which can't be done until the danger of the ureter swelling shut has passed, which varies.  You may see some  blood in your urine while the stent is in place and a few days afterwards. Do not be alarmed, even if the urine was clear for a while. Get off your feet and drink lots of fluids until clearing occurs. If you start to pass clots or don't improve, call us.  DIET: You may return to your normal diet immediately. Because of the raw surface of your bladder, alcohol, spicy foods, acid type foods and drinks with caffeine may cause irritation or frequency and should be used in moderation. To keep your urine flowing freely and to avoid constipation, drink plenty of fluids during the day ( 8-10 glasses ). Tip: Avoid cranberry juice because it is very acidic.  ACTIVITY: Your physical activity doesn't need to be restricted. However, if you are very active, you may see some blood in your urine. We suggest that you reduce your activity under these circumstances until the bleeding has stopped.  BOWELS: It is important to keep your bowels regular during the postoperative period. Straining with bowel movements can cause bleeding. A bowel movement every other day is reasonable. Use a mild laxative if needed, such as Milk of Magnesia 2-3 tablespoons, or 2 Dulcolax tablets. Call if you continue to have problems. If you have been taking narcotics for pain, before, during or after your surgery, you may be constipated. Take a laxative if necessary.   MEDICATION: You should resume your pre-surgery medications unless told not to. In addition you will often be given an antibiotic to prevent infection. These should be taken as prescribed until the bottles are finished unless   you are having an unusual reaction to one of the drugs.  PROBLEMS YOU SHOULD REPORT TO US: Fevers over 100.5 Fahrenheit. Heavy bleeding, or clots ( See above notes about blood in urine ). Inability to urinate. Drug reactions ( hives, rash, nausea, vomiting, diarrhea ). Severe burning or pain with urination that is not improving.  FOLLOW-UP: You will  need a follow-up appointment to monitor your progress. Call for this appointment at the number listed above. Usually the first appointment will be about three to fourteen days after your surgery.      

## 2014-01-24 NOTE — Anesthesia Postprocedure Evaluation (Signed)
  Anesthesia Post-op Note  Patient: Anthony [name]  Procedure(s) Performed: Procedure(s) (LRB): CYSTOSCOPY WITH RETROGRADE PYELOGRAM, URETEROSCOPY AND STENT PLACEMENT (Right)  Patient Location: PACU  Anesthesia Type: General  Level of Consciousness: awake and alert   Airway and Oxygen Therapy: Patient Spontanous Breathing  Post-op Pain: mild  Post-op Assessment: Post-op Vital signs reviewed, Patient's Cardiovascular Status Stable, Respiratory Function Stable, Patent Airway and No signs of Nausea or vomiting  Last Vitals:  Filed Vitals:   01/24/14 1630  BP: 145/88  Pulse: 60  Temp:   Resp: 14    Post-op Vital Signs: stable   Complications: No apparent anesthesia complications

## 2014-01-24 NOTE — Op Note (Signed)
Anthony [name] is a 63 y.o.   01/24/2014  General  Pre-op diagnosis: Right proximal ureteral calculi, right flank pain, status post ESL  Postop diagnosis: Same  Procedure done: Cystoscopy, right retrograde pyelogram, ureteroscopy, holmium laser of ureteral calculi, stone extraction, double-J stent insertion  Surgeon: Wendie Simmer. Jaedyn Marrufo  Anesthesia: General  Indication: Patient is a 63 years old male who had ESL at a 9 x 7 mm right proximal ureteral stone by Dr. Annabell Howells on 02-09-2023. He has passed several small stone fragments. However you had severe pain this morning associated with nausea. The pain was not relieved by hydromorphone. KUB showed a possible smaller distal ureteral calculi. He has some hardware from back surgery in the area of the lumbar spine that may be difficult to see any stone in the proximal ureter. He is scheduled for cystoscopy, retrograde pyelogram, ureteroscopy, stone manipulation  Procedure: Patient was identified by his wrist band and proper timeout was taken.  Under general anesthesia he was prepped and draped and placed in the dorsolithotomy position. A panendoscope was inserted in the bladder. The anterior urethra is normal. He has moderate prostatic hypertrophy. The bladder is mildly trabeculated. There is no stone or tumor in the bladder. The ureteral orifices are in normal position and shape.  Right retrograde pyelogram:  A cone-tip catheter was passed through the cystoscope and the right ureteral orifice. Contrast was injected through the cone-tip catheter. The distal ureter is normal. Contrast would not go up the mid ureter. I did not see any filling defect in the distal ureter. The cone-tip catheter was removed. A sensor wire was passed through a #6 [place] open-ended catheter. The open-ended catheter and the sensor wire were passed through the cystoscope and the right ureter. The sensor wire was then removed. Contrast was then injected through the open-ended catheter.  The distal and mid ureter are normal. There are several filling defects in the proximal ureter at the L2 level. Contrast filled the renal pelvis and calyces that appear moderately dilated. The lower pole calyx was not filled with contrast. The sensor wire was then passed through the open-ended catheter all to the renal pelvis and the open-ended catheter was removed. The sensor wire was left in place as a safety wire. The cystoscope was removed.  A 6.5 French semirigid ureteroscope was passed in the bladder and through the ureter. There is no evidence of stone fragments in the distal or in the mid ureter. There are several relatively large stone fragments in the proximal ureter. With a 365 microfiber holmium laser the calculi  were fragmented in multiple smaller fragments. This was done sequentially. After fragmenting each stone the smaller fragments were removed. A total of  4 large stone fragments were treated. All stone fragments were removed out of the ureter with the Nitinol basket.  Second retrograde pyelogram.  Contrast was then injected through the ureteroscope. There is no evidence of extravasation of contrast. There are no fragments in the ureter. There are 3 small residual fragments in the renal pelvis that are small enough for the patient to pass them spontaneously. The ureteroscope was then removed under direct vision.  The sensor wire was then backloaded into the cystoscope. A #6 [place]- 24 double-J stent was passed over the sensor wire. The proximal curl of the double-J stent is in the renal pelvis. The distal curl is in the bladder. The bladder was then emptied and the cystoscope removed. No string was left attached to the double-J stent.  Patient  tolerated the procedure well and left the OR in satisfactory condition to postanesthesia care unit.

## 2014-01-24 NOTE — Anesthesia Procedure Notes (Signed)
Procedure Name: LMA Insertion Date/Time: 01/24/2014 3:20 PM Performed by: Renella Cunas D Pre-anesthesia Checklist: Patient identified, Emergency Drugs available, Suction available and Patient being monitored Patient Re-evaluated:Patient Re-evaluated prior to inductionOxygen Delivery Method: Circle System Utilized Preoxygenation: Pre-oxygenation with 100% oxygen Intubation Type: IV induction Ventilation: Mask ventilation without difficulty LMA: LMA inserted LMA Size: 4.0 Number of attempts: 1 Airway Equipment and Method: bite block Placement Confirmation: positive ETCO2 Tube secured with: Tape Dental Injury: Teeth and Oropharynx as per pre-operative assessment

## 2014-01-24 NOTE — Transfer of Care (Signed)
Immediate Anesthesia Transfer of Care Note  Patient: Anthony [name]  Procedure(s) Performed: Procedure(s) (LRB): CYSTOSCOPY WITH RETROGRADE PYELOGRAM, URETEROSCOPY AND STENT PLACEMENT (Right)  Patient Location: PACU  Anesthesia Type: General  Level of Consciousness: awake, oriented, sedated and patient cooperative  Airway & Oxygen Therapy: Patient Spontanous Breathing and Patient connected to face mask oxygen  Post-op Assessment: Report given to PACU RN and Post -op Vital signs reviewed and stable  Post vital signs: Reviewed and stable  Complications: No apparent anesthesia complications

## 2014-01-25 ENCOUNTER — Encounter (HOSPITAL_BASED_OUTPATIENT_CLINIC_OR_DEPARTMENT_OTHER): Payer: Self-pay | Admitting: Urology

## 2014-02-04 ENCOUNTER — Other Ambulatory Visit: Payer: Self-pay | Admitting: Urology

## 2014-02-06 ENCOUNTER — Encounter (HOSPITAL_BASED_OUTPATIENT_CLINIC_OR_DEPARTMENT_OTHER): Payer: Self-pay | Admitting: *Deleted

## 2014-02-06 NOTE — Progress Notes (Signed)
Pt instructed npo p mn tonight x prilosec w sip of water.  To [facility] 6/18 @ 1115.  Needs istat 8 on arrival

## 2014-02-07 ENCOUNTER — Ambulatory Visit (HOSPITAL_BASED_OUTPATIENT_CLINIC_OR_DEPARTMENT_OTHER)
Admission: RE | Admit: 2014-02-07 | Discharge: 2014-02-07 | Disposition: A | Discharge status: Home / Self Care | Payer: BC Managed Care – PPO | Source: Ambulatory Visit | Attending: Urology | Admitting: Urology

## 2014-02-07 ENCOUNTER — Ambulatory Visit (HOSPITAL_BASED_OUTPATIENT_CLINIC_OR_DEPARTMENT_OTHER): Payer: BC Managed Care – PPO | Admitting: Certified Registered"

## 2014-02-07 ENCOUNTER — Encounter (HOSPITAL_BASED_OUTPATIENT_CLINIC_OR_DEPARTMENT_OTHER)
Admission: RE | Disposition: A | Discharge status: Home / Self Care | Payer: Self-pay | Source: Ambulatory Visit | Attending: Urology

## 2014-02-07 ENCOUNTER — Encounter (HOSPITAL_BASED_OUTPATIENT_CLINIC_OR_DEPARTMENT_OTHER): Payer: BC Managed Care – PPO | Admitting: Certified Registered"

## 2014-02-07 ENCOUNTER — Encounter (HOSPITAL_BASED_OUTPATIENT_CLINIC_OR_DEPARTMENT_OTHER): Payer: Self-pay | Admitting: *Deleted

## 2014-02-07 DIAGNOSIS — Z79899 Other long term (current) drug therapy: Secondary | ICD-10-CM | POA: Insufficient documentation

## 2014-02-07 DIAGNOSIS — IMO0002 Reserved for concepts with insufficient information to code with codable children: Secondary | ICD-10-CM | POA: Insufficient documentation

## 2014-02-07 DIAGNOSIS — N200 Calculus of kidney: Secondary | ICD-10-CM | POA: Insufficient documentation

## 2014-02-07 DIAGNOSIS — N1330 Unspecified hydronephrosis: Secondary | ICD-10-CM | POA: Insufficient documentation

## 2014-02-07 DIAGNOSIS — Z87891 Personal history of nicotine dependence: Secondary | ICD-10-CM | POA: Insufficient documentation

## 2014-02-07 DIAGNOSIS — Y849 Medical procedure, unspecified as the cause of abnormal reaction of the patient, or of later complication, without mention of misadventure at the time of the procedure: Secondary | ICD-10-CM | POA: Insufficient documentation

## 2014-02-07 HISTORY — DX: Hematuria, unspecified: R31.9

## 2014-02-07 HISTORY — PX: CYSTOSCOPY WITH RETROGRADE PYELOGRAM, URETEROSCOPY AND STENT PLACEMENT: SHX5789

## 2014-02-07 HISTORY — PX: HOLMIUM LASER APPLICATION: SHX5852

## 2014-02-07 LAB — POCT I-STAT, CHEM 8
BUN: 21 mg/dL (ref 6–23)
Calcium, Ion: 1.31 mmol/L — ABNORMAL HIGH (ref 1.13–1.30)
Chloride: 103 mEq/L (ref 96–112)
Creatinine, Ser: 1.6 mg/dL — ABNORMAL HIGH (ref 0.50–1.35)
Glucose, Bld: 98 mg/dL (ref 70–99)
HCT: 50 % (ref 39.0–52.0)
Hemoglobin: 17 g/dL (ref 13.0–17.0)
Potassium: 4.1 mEq/L (ref 3.7–5.3)
Sodium: 144 mEq/L (ref 137–147)
TCO2: 24 mmol/L (ref 0–100)

## 2014-02-07 SURGERY — CYSTOURETEROSCOPY, WITH RETROGRADE PYELOGRAM AND STENT INSERTION
Anesthesia: General | Site: Ureter | Laterality: Right

## 2014-02-07 MED ORDER — SODIUM CHLORIDE 0.9 % IJ SOLN
3.0000 mL | INTRAMUSCULAR | Status: DC | PRN
Start: 2014-02-07 — End: 2014-02-07
  Filled 2014-02-07: qty 3

## 2014-02-07 MED ORDER — ONDANSETRON HCL 4 MG/2ML IJ SOLN
INTRAMUSCULAR | Status: DC | PRN
  Administered 2014-02-07: 4 mg via INTRAVENOUS

## 2014-02-07 MED ORDER — ACETAMINOPHEN 10 MG/ML IV SOLN
INTRAVENOUS | Status: DC | PRN
  Administered 2014-02-07: 1000 mg via INTRAVENOUS

## 2014-02-07 MED ORDER — FENTANYL CITRATE 0.05 MG/ML IJ SOLN
INTRAMUSCULAR | Status: AC
  Filled 2014-02-07: qty 6

## 2014-02-07 MED ORDER — HYDROMORPHONE HCL 2 MG PO TABS: 2.0000 mg | ORAL_TABLET | ORAL | Status: DC | PRN

## 2014-02-07 MED ORDER — CIPROFLOXACIN IN D5W 400 MG/200ML IV SOLN
400.0000 mg | INTRAVENOUS | Status: AC
  Administered 2014-02-07: 400 mg via INTRAVENOUS
  Filled 2014-02-07: qty 200

## 2014-02-07 MED ORDER — MIDAZOLAM HCL 2 MG/2ML IJ SOLN
INTRAMUSCULAR | Status: AC
  Filled 2014-02-07: qty 2

## 2014-02-07 MED ORDER — IOHEXOL 350 MG/ML SOLN
INTRAVENOUS | Status: DC | PRN
  Administered 2014-02-07: .1 mL

## 2014-02-07 MED ORDER — SODIUM CHLORIDE 0.9 % IV SOLN
250.0000 mL | INTRAVENOUS | Status: DC | PRN
  Filled 2014-02-07: qty 250

## 2014-02-07 MED ORDER — DEXAMETHASONE SODIUM PHOSPHATE 4 MG/ML IJ SOLN
INTRAMUSCULAR | Status: DC | PRN
  Administered 2014-02-07: 10 mg via INTRAVENOUS

## 2014-02-07 MED ORDER — BELLADONNA ALKALOIDS-OPIUM 16.2-60 MG RE SUPP
RECTAL | Status: AC
  Filled 2014-02-07: qty 1

## 2014-02-07 MED ORDER — BELLADONNA ALKALOIDS-OPIUM 16.2-60 MG RE SUPP
RECTAL | Status: DC | PRN
  Administered 2014-02-07: 1 via RECTAL

## 2014-02-07 MED ORDER — OXYCODONE HCL 5 MG PO TABS
5.0000 mg | ORAL_TABLET | ORAL | Status: DC | PRN
  Filled 2014-02-07: qty 2

## 2014-02-07 MED ORDER — SODIUM CHLORIDE 0.9 % IJ SOLN
3.0000 mL | Freq: Two times a day (BID) | INTRAMUSCULAR | Status: DC
  Filled 2014-02-07: qty 3

## 2014-02-07 MED ORDER — STERILE WATER FOR IRRIGATION IR SOLN
Status: DC | PRN
  Administered 2014-02-07: 500 mL

## 2014-02-07 MED ORDER — MIDAZOLAM HCL 5 MG/5ML IJ SOLN
INTRAMUSCULAR | Status: DC | PRN
  Administered 2014-02-07: 2 mg via INTRAVENOUS

## 2014-02-07 MED ORDER — LIDOCAINE HCL (CARDIAC) 20 MG/ML IV SOLN
INTRAVENOUS | Status: DC | PRN
  Administered 2014-02-07: 60 mg via INTRAVENOUS

## 2014-02-07 MED ORDER — PHENAZOPYRIDINE HCL 200 MG PO TABS: 200.0000 mg | ORAL_TABLET | Freq: Three times a day (TID) | ORAL | Status: DC | PRN

## 2014-02-07 MED ORDER — FENTANYL CITRATE 0.05 MG/ML IJ SOLN
25.0000 ug | INTRAMUSCULAR | Status: DC | PRN
  Filled 2014-02-07: qty 1

## 2014-02-07 MED ORDER — SODIUM CHLORIDE 0.9 % IR SOLN
Status: DC | PRN
  Administered 2014-02-07: 4000 mL

## 2014-02-07 MED ORDER — PROPOFOL 10 MG/ML IV BOLUS
INTRAVENOUS | Status: DC | PRN
  Administered 2014-02-07: 160 mg via INTRAVENOUS

## 2014-02-07 MED ORDER — ACETAMINOPHEN 650 MG RE SUPP
650.0000 mg | RECTAL | Status: DC | PRN
  Filled 2014-02-07: qty 1

## 2014-02-07 MED ORDER — LACTATED RINGERS IV SOLN
INTRAVENOUS | Status: DC
  Administered 2014-02-07 (×2): via INTRAVENOUS
  Filled 2014-02-07: qty 1000

## 2014-02-07 MED ORDER — ACETAMINOPHEN 325 MG PO TABS
650.0000 mg | ORAL_TABLET | ORAL | Status: DC | PRN
  Filled 2014-02-07: qty 2

## 2014-02-07 MED ORDER — PROMETHAZINE HCL 25 MG/ML IJ SOLN
6.2500 mg | INTRAMUSCULAR | Status: DC | PRN
  Filled 2014-02-07: qty 1

## 2014-02-07 MED ORDER — FENTANYL CITRATE 0.05 MG/ML IJ SOLN
INTRAMUSCULAR | Status: DC | PRN
  Administered 2014-02-07: 25 ug via INTRAVENOUS
  Administered 2014-02-07: 50 ug via INTRAVENOUS
  Administered 2014-02-07: 25 ug via INTRAVENOUS

## 2014-02-07 SURGICAL SUPPLY — 43 items
BAG DRAIN URO-CYSTO SKYTR STRL (DRAIN) ×3 IMPLANT
BASKET LASER NITINOL 1.9FR (BASKET) IMPLANT
BASKET STONE 1.7 NGAGE (UROLOGICAL SUPPLIES) ×6 IMPLANT
BASKET STONE NCOMPASS (UROLOGICAL SUPPLIES) ×3 IMPLANT
BASKET ZERO TIP NITINOL 2.4FR (BASKET) IMPLANT
CANISTER SUCT LVC 12 LTR MEDI- (MISCELLANEOUS) ×3 IMPLANT
CATH URET 5FR 28IN CONE TIP (BALLOONS)
CATH URET 5FR 28IN OPEN ENDED (CATHETERS) ×3 IMPLANT
CATH URET 5FR 70CM CONE TIP (BALLOONS) IMPLANT
CLOTH BEACON ORANGE TIMEOUT ST (SAFETY) ×3 IMPLANT
DRAPE CAMERA CLOSED 9X96 (DRAPES) ×3 IMPLANT
ELECT REM PT RETURN 9FT ADLT (ELECTROSURGICAL)
ELECTRODE REM PT RTRN 9FT ADLT (ELECTROSURGICAL) IMPLANT
FIBER LASER FLEXIVA 1000 (UROLOGICAL SUPPLIES) IMPLANT
FIBER LASER FLEXIVA 200 (UROLOGICAL SUPPLIES) IMPLANT
FIBER LASER FLEXIVA 365 (UROLOGICAL SUPPLIES) IMPLANT
FIBER LASER FLEXIVA 550 (UROLOGICAL SUPPLIES) IMPLANT
FIBER LASER TRAC TIP (UROLOGICAL SUPPLIES) ×3 IMPLANT
GLOVE BIO SURGEON STRL SZ 6.5 (GLOVE) ×2 IMPLANT
GLOVE BIO SURGEONS STRL SZ 6.5 (GLOVE) ×1
GLOVE INDICATOR 7.0 STRL GRN (GLOVE) ×6 IMPLANT
GLOVE SURG SS PI 8.0 STRL IVOR (GLOVE) ×3 IMPLANT
GOWN PREVENTION PLUS LG XLONG (DISPOSABLE) IMPLANT
GOWN STRL REIN XL XLG (GOWN DISPOSABLE) IMPLANT
GOWN STRL REUS W/ TWL LRG LVL3 (GOWN DISPOSABLE) ×1 IMPLANT
GOWN STRL REUS W/ TWL XL LVL3 (GOWN DISPOSABLE) ×1 IMPLANT
GOWN STRL REUS W/TWL LRG LVL3 (GOWN DISPOSABLE) ×2
GOWN STRL REUS W/TWL XL LVL3 (GOWN DISPOSABLE) ×2
GUIDEWIRE 0.038 PTFE COATED (WIRE) IMPLANT
GUIDEWIRE ANG ZIPWIRE 038X150 (WIRE) IMPLANT
GUIDEWIRE STR DUAL SENSOR (WIRE) ×3 IMPLANT
IV NS 1000ML (IV SOLUTION) ×2
IV NS 1000ML BAXH (IV SOLUTION) ×1 IMPLANT
IV NS IRRIG 3000ML ARTHROMATIC (IV SOLUTION) ×3 IMPLANT
KIT BALLIN UROMAX 15FX10 (LABEL) IMPLANT
KIT BALLN UROMAX 15FX4 (MISCELLANEOUS) IMPLANT
KIT BALLN UROMAX 26 75X4 (MISCELLANEOUS)
PACK CYSTOSCOPY (CUSTOM PROCEDURE TRAY) ×3 IMPLANT
SET HIGH PRES BAL DIL (LABEL)
SHEATH ACCESS URETERAL 38CM (SHEATH) ×3 IMPLANT
SHEATH ACCESS URETERAL 54CM (SHEATH) IMPLANT
STENT ×3 IMPLANT
STENT URET 6FRX24 CONTOUR (STENTS) ×3 IMPLANT

## 2014-02-07 NOTE — Interval H&P Note (Signed)
History and Physical Interval Note:  He has residual renal stones with a 6mm fragment in the lower pole along with multiple smaller fragments that need to be fragmented and retrieved.   02/07/2014 12:40 PM  Anthony LeffWilliam R Mcclimans  has presented today for surgery, with the diagnosis of right renal stone  The various methods of treatment have been discussed with the patient and family. After consideration of risks, benefits and other options for treatment, the patient has consented to  Procedure(s): RIGHT URETEROSCOPY, STONE EXTRACTION AND POSSIBLE STENT PLACEMENT (Right) HOLMIUM LASER APPLICATION (Right) as a surgical intervention .  The patient's history has been reviewed, patient examined, no change in status, stable for surgery.  I have reviewed the patient's chart and labs.  Questions were answered to the patient's satisfaction.     [name]

## 2014-02-07 NOTE — Transfer of Care (Signed)
Immediate Anesthesia Transfer of Care Note  Patient: Anthony LeffWilliam R [name]  Procedure(s) Performed: Procedure(s) (LRB): RIGHT URETEROSCOPY, STONE EXTRACTION AND POSSIBLE STENT PLACEMENT (Right) HOLMIUM LASER APPLICATION (Right)  Patient Location: PACU  Anesthesia Type: General  Level of Consciousness: awake, oriented, sedated and patient cooperative  Airway & Oxygen Therapy: Patient Spontanous Breathing and Patient connected to face mask oxygen  Post-op Assessment: Report given to PACU RN and Post -op Vital signs reviewed and stable  Post vital signs: Reviewed and stable  Complications: No apparent anesthesia complications

## 2014-02-07 NOTE — Anesthesia Procedure Notes (Signed)
Procedure Name: LMA Insertion Date/Time: 02/07/2014 12:50 PM Performed by: Renella CunasHAZEL, Jaylun Fleener D Pre-anesthesia Checklist: Patient identified, Emergency Drugs available, Suction available and Patient being monitored Patient Re-evaluated:Patient Re-evaluated prior to inductionOxygen Delivery Method: Circle System Utilized Preoxygenation: Pre-oxygenation with 100% oxygen Intubation Type: IV induction Ventilation: Mask ventilation without difficulty LMA: LMA inserted LMA Size: 4.0 Number of attempts: 1 Airway Equipment and Method: bite block Placement Confirmation: positive ETCO2 Tube secured with: Tape Dental Injury: Teeth and Oropharynx as per pre-operative assessment

## 2014-02-07 NOTE — Discharge Instructions (Signed)
Ureteral Stent Implantation, Care After °Refer to this sheet in the next few weeks. These instructions provide you with information on caring for yourself after your procedure. Your health care provider may also give you more specific instructions. Your treatment has been planned according to current medical practices, but problems sometimes occur. Call your health care provider if you have any problems or questions after your procedure. °WHAT TO EXPECT AFTER THE PROCEDURE °You should be back to normal activity within 48 hours after the procedure. Nausea and vomiting may occur and are commonly the result of anesthesia. °It is common to experience sharp pain in the back or lower abdomen and penis with voiding. This is caused by movement of the ends of the stent with the act of urinating. It usually goes away within minutes after you have stopped urinating. °HOME CARE INSTRUCTIONS °Make sure to drink plenty of fluids. You may have small amounts of bleeding, causing your urine to be red. This is normal. Certain movements may trigger pain or a feeling that you need to urinate. You may be given medicines to prevent infection or bladder spasms. Be sure to take all medicines as directed. Only take over-the-counter or prescription medicines for pain, discomfort, or fever as directed by your health care provider. Do not take aspirin, as this can make bleeding worse. °Your stent will be left in until the blockage is resolved. This may take 2 weeks or longer, depending on the reason for stent implantation. You may have an X-ray exam to make sure your ureter is open and that the stent has not moved out of position (migrated). The stent can be removed by your health care provider in the office. Medicines may be given for comfort while the stent is being removed. Be sure to keep all follow-up appointments so your health care provider can check that you are healing properly. °SEEK MEDICAL CARE IF: °· You experience increasing  pain. °· Your pain medicine is not working. °SEEK IMMEDIATE MEDICAL CARE IF: °· Your urine is dark red or has blood clots. °· You are leaking urine (incontinent). °· You have a fever, chills, feeling sick to your stomach (nausea), or vomiting. °· Your pain is not relieved by pain medicine. °· The end of the stent comes out of the urethra. °· You are unable to urinate. °Document Released: 04/11/2013 Document Revised: 08/14/2013 Document Reviewed: 04/11/2013 °[employer]® Patient Information ©2015 [employer]. This information is not intended to replace advice given to you by your health care provider. Make sure you discuss any questions you have with your health care provider. ° ° ° °Post Anesthesia Home Care Instructions ° °Activity: °Get plenty of rest for the remainder of the day. A responsible adult should stay with you for 24 hours following the procedure.  °For the next 24 hours, DO NOT: °-Drive a car °-Operate machinery °-Drink alcoholic beverages °-Take any medication unless instructed by your physician °-Make any legal decisions or sign important papers. ° °Meals: °Start with liquid foods such as gelatin or soup. Progress to regular foods as tolerated. Avoid greasy, spicy, heavy foods. If nausea and/or vomiting occur, drink only clear liquids until the nausea and/or vomiting subsides. Call your physician if vomiting continues. ° °Special Instructions/Symptoms: °Your throat may feel dry or sore from the anesthesia or the breathing tube placed in your throat during surgery. If this causes discomfort, gargle with warm salt water. The discomfort should disappear within 24 hours. ° °

## 2014-02-07 NOTE — H&P (View-Only) (Signed)
Active Problems Problems  1. Gross hematuria (599.71)   Assessed By: Warden, Diane (Urology); Last Assessed: 24 Jan 2014 2. Nausea (787.02)   Assessed By: Warden, Diane (Urology); Last Assessed: 24 Jan 2014 3. Rt flank pain (789.09)   Assessed By: Warden, Diane (Urology); Last Assessed: 24 Jan 2014  History of Present Illness 62 YO male patient of Dr. Wrenn's seen today as a work-in for pain and nausea. S/P (R) ESWL 5/218/15.  GU HX:  Last seen 01/07/14 to discuss treatment options for his 9 x 7 mm right proximal ureteral stone. He has some hematuria. He also has a 19 x 15 mm RLP stone and 3 stones on the left.   Interval HX:  Has passed 6-7 small stone fragments. Today though right flank pain is 10+/10 w/o relief with Hydromorphone 2 mg 4 hrs ago. Pain has also been so bad he has nausea. Denies f/c or vomiting but has redeveloped gross hematuria.   Past Medical History Problems  1. History of Arthritis (V13.4) 2. History of Calculus of right ureter (592.1) 3. History of Calculus of ureter (592.1) 4. History of Hematoma Of The Left Kidney (866.01) 5. History of acute pyelonephritis (V13.02) 6. History of kidney stones (V13.01) 7. History of Hydronephrosis, right (591) 8. History of Lumbar radiculopathy (724.4) 9. History of Microscopic hematuria (599.72) 10. 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 History of Arm Incision 2. History of Back Surgery 3. History of Cystoscopy With Insertion Of Ureteral Stent Left 4. History of Cystoscopy With Insertion Of Ureteral Stent Right 5. History of Cystoscopy With Ureteroscopy Right 6. History of Cystoscopy With Ureteroscopy With Lithotripsy 7. History of Cystoscopy With Ureteroscopy With Removal Of Calculus 8. History of Lithotripsy 9. History of Lithotripsy 10. History of Lithotripsy 11. History of Lower Back Surgery 12. History of Neck Surgery  Current Meds 1. HYDROmorphone HCl - 2 MG Oral  Tablet; TAKE 1 TABLET EVERY 4 TO 6 HOURS AS  NEEDED FOR PAIN;  Therapy: 14May2015 to (Evaluate:18May2015); Last Rx:14May2015 Ordered 2. Naproxen 500 MG Oral Tablet; TAKE TABLET  PRN;  Therapy: (Recorded:14May2015) to Recorded 3. Omeprazole CPDR;  Therapy: (Recorded:14May2015) to Recorded 4. Tamsulosin HCl - 0.4 MG Oral Capsule; TAKE 1 CAPSULE Daily;  Therapy: 01Jun2015 to (Evaluate:01Jul2015)  Requested for: 01Jun2015; Last  Rx:01Jun2015 Ordered  Allergies Medication  1. Codeine Derivatives 2. Doxycycline Hyclate CAPS  Family History Problems  1. Family history of Colon Cancer (V16.0) : Mother 2. Family history of Colon Cancer (V16.0) : Maternal Aunt 3. Family history of Colon Cancer (V16.0) : Maternal Aunt 4. Family history of Colon Cancer (V16.0) : Maternal Uncle 5. Family history of Death In The Family Aunt : Maternal Aunt   died age 65-colon cancer 6. Family history of Death In The Family Aunt : Maternal Aunt   died age -61 colon cancer 7. Family history of Death In The Family Father   died age 79-mini strokes 8. Family history of Death In The Family Mother   died age 66-colon cancer 9. Family history of Family Health Status Number Of Children   1 daughter and 2 sons 10. Family history of Hematuria : Mother 11. Family history of Nephrolithiasis : Mother 12. Family history of Stroke Syndrome (V17.1) : Mother 13. Family history of Stroke Syndrome (V17.1) : Father  Social History Problems  1. Being A Social Drinker 2. Caffeine Use 3. Former Smoker   smoked a pipe for about 3 yrs and quit 4 yrs 4.   Marital History - Widowed 5. Occupation:   truck driver/labor  Review of Systems Genitourinary, constitutional, skin, eye, otolaryngeal, hematologic/lymphatic, cardiovascular, pulmonary, endocrine, musculoskeletal, gastrointestinal, neurological and psychiatric system(s) were reviewed and pertinent findings if present are noted.  Gastrointestinal: nausea and flank  pain.    Vitals Vital Signs [Data Includes: Last 1 Day]  Recorded: 04Jun2015 11:32AM  Blood Pressure: 146 / 86 Temperature: 97.9 F Heart Rate: 72  Physical Exam Constitutional: Well nourished and well developed . No acute distress. The patient appears well hydrated.  ENT:1 . The ears and nose are normal in appearance1 .  Neck:1  The appearance of the neck is normal1  and no neck mass is present1 .  Pulmonary:1  No respiratory distress1  and normal respiratory rhythm and effort1 .  Cardiovascular:1  Heart rate and rhythm are normal1  . No peripheral edema.1 .  Abdomen: The abdomen is flat. The abdomen is soft and nontender. No masses are palpated1  Moderate tenderness in the RLQ is present, but no LLQ tenderness. Right CVA tenderness and no left CVA tenderness no CVA tenderness1 . No hernias are palpable1  No hepatosplenomegaly noted1   Rectal:  Genitourinary: Examination of the penis demonstrates1  no discharge1 , no masses1 , no lesions1  and a normal meatus1 . The scrotum is1  without lesions1 . The right epididymis is1  palpably normal1  and non-tender1 . The left epididymis is1  palpably normal1  and non-tender1 . The right testis is1  non-tender1  and without masses1 . The left testis is1  non-tender1  and without masses1 .  Lymphatics: The  femoral1  and  inguinal1  nodes are not enlarged or tender1 .  Skin:1  Normal skin turgor1 , no visible rash1  and no visible skin lesions1 .  Neuro/Psych:1 . Mood and affect are appropriate1 .     1 Amended By: Adrienne Delay; Jan 24 2014 2:16 PM EST  Results/Data Urine [Data Includes: Last 1 Day]   04Jun2015  COLOR RED   APPEARANCE CLOUDY   SPECIFIC GRAVITY 1.020   pH 6.0   GLUCOSE NEG mg/dL  BILIRUBIN NEG   KETONE NEG mg/dL  BLOOD LARGE   PROTEIN TRACE mg/dL  UROBILINOGEN 0.2 mg/dL  NITRITE NEG   LEUKOCYTE ESTERASE NEG   SQUAMOUS EPITHELIAL/HPF NONE SEEN   WBC NONE SEEN WBC/hpf  RBC TNTC RBC/hpf  BACTERIA MODERATE   CRYSTALS NONE  SEEN   CASTS NONE SEEN    The following images/tracing/specimen were independently visualized:  KUB: shows small scattered stone fragments w/in right renal pelvis. There is gas/stool over lying right upper ureter which makes it difficult to assess if stone fragments still present but on RUS had dilated proximal ureter. There are some small distal calcifications noted near bladder. This KUB was reviewed by Dr. Wrenn and Dr. Zavier Canela. RUS: (limited: right: 14.05 X 1.12 X 6.10 X 5.82 cm. Moderate hydronephrosis and proximal hydroureter. Several hyperechoic areas noted w/in renal pelvis.  The following clinical lab reports were reviewed:  UA- TNTC RBC and few bacteria. Will culture.  PVR: Ultrasound PVR 56.21 ml.    Assessment Assessed  1. Rt flank pain (789.09) 2. Nausea (787.02) 3. Gross hematuria (599.71)  Plan  Health Maintenance  1. UA With REFLEX; [Do Not Release]; Status:Complete;   Done: 04Jun2015 11:20AM Rt flank pain  2. Administered: Ketorolac Tromethamine 60 MG/2ML Injection Solution 3. KUB; Status:Complete;   Done: 04Jun2015 12:00AM 4. RENAL U/S RIGHT; Status:Resulted - Requires Verification;   Done:   04Jun2015 12:00AM 5. Stone Analysis; Status:In Progress - Specimen/Data Collected;   Done: 04Jun2015  Ketorolac 60 mg IM today Culture urine Stone fragments sent for analysis   Discussed with pt (1) allow more time for passage of stone fragments since pain is now controlled with Ketorolac or (2) proceed with cystourethroscopy, R RPG, stone extraction, and possible double J stent placement by Dr. Alecia Doi (on-call). He and wife both wish to proceed with surgical intervention at this time. Risks and benefits discussed which include: hematuria, infection, risk of injury to bladder or ureter, and general anesthesia. Voices understanding. All questions addressed and answered to their satisfaction   URINE CULTURE; Status:In Progress - Specimen/Data Collected;  Done: 04Jun2015 Perform:[facility];  Due:06Jun2015; Marked Important; Last Updated By:Deacon, Debbie; 01/24/2014 12:04:58 PM;Ordered; Today;  For:Gross hematuria, Rt flank pain; Ordered By:Warden, Diane;   

## 2014-02-07 NOTE — Anesthesia Preprocedure Evaluation (Addendum)
Anesthesia Evaluation  Patient identified by MRN, date of birth, ID band Patient awake    Reviewed: Allergy & Precautions, H&P , NPO status , Patient's Chart, lab work & pertinent test results  Airway Mallampati: II TM Distance: >3 FB Neck ROM: Full    Dental  (+) Missing, Poor Dentition, Dental Advisory Given   Pulmonary neg pulmonary ROS, former smoker,  breath sounds clear to auscultation  Pulmonary exam normal       Cardiovascular negative cardio ROS  Rhythm:Regular Rate:Normal     Neuro/Psych negative neurological ROS  negative psych ROS   GI/Hepatic negative GI ROS, Neg liver ROS,   Endo/Other  negative endocrine ROS  Renal/GU Renal InsufficiencyRenal disease  negative genitourinary   Musculoskeletal negative musculoskeletal ROS (+)   Abdominal   Peds negative pediatric ROS (+)  Hematology negative hematology ROS (+)   Anesthesia Other Findings   Reproductive/Obstetrics negative OB ROS                          Anesthesia Physical Anesthesia Plan  ASA: II  Anesthesia Plan: General   Post-op Pain Management:    Induction: Intravenous  Airway Management Planned: LMA  Additional Equipment:   Intra-op Plan:   Post-operative Plan: Extubation in OR  Informed Consent: I have reviewed the patients History and Physical, chart, labs and discussed the procedure including the risks, benefits and alternatives for the proposed anesthesia with the patient or authorized representative who has indicated his/her understanding and acceptance.   Dental advisory given  Plan Discussed with: CRNA and Surgeon  Anesthesia Plan Comments:         Anesthesia Quick Evaluation

## 2014-02-07 NOTE — Brief Op Note (Signed)
02/07/2014  2:50 PM  PATIENT:  Anthony [name]  63 y.o. male  PRE-OPERATIVE DIAGNOSIS:  right renal stone  POST-OPERATIVE DIAGNOSIS:  right renal stone  PROCEDURE:  Procedure(s): RIGHT URETEROSCOPY, STONE EXTRACTION AND POSSIBLE STENT PLACEMENT (Right) HOLMIUM LASER APPLICATION (Right)  SURGEON:  Surgeon(s) and Role:    * Anner CreteJohn J Keylon Labelle, MD - Primary  PHYSICIAN ASSISTANT:   ASSISTANTS: none   ANESTHESIA:   general  EBL:  Total I/O In: 1500 [I.V.:1500] Out: -   BLOOD ADMINISTERED:none  DRAINS: 6 x 24 right JJ stent   LOCAL MEDICATIONS USED:  NONE  SPECIMEN:  Source of Specimen:  stone fragments  DISPOSITION OF SPECIMEN:  to family  COUNTS:  YES  TOURNIQUET:  * No tourniquets in log *  DICTATION: .Other Dictation: Dictation Number [id number]  PLAN OF CARE: Discharge to home after PACU  PATIENT DISPOSITION:  PACU - hemodynamically stable.   Delay start of Pharmacological VTE agent (>24hrs) due to surgical blood loss or risk of bleeding: not applicable

## 2014-02-08 NOTE — Anesthesia Postprocedure Evaluation (Signed)
Anesthesia Post Note  Patient: Anthony [name]  Procedure(s) Performed: Procedure(s) (LRB): RIGHT URETEROSCOPY, STONE EXTRACTION AND POSSIBLE STENT PLACEMENT (Right) HOLMIUM LASER APPLICATION (Right)  Anesthesia type: General  Patient location: PACU  Post pain: Pain level controlled  Post assessment: Post-op Vital signs reviewed  Last Vitals: BP 135/83  Pulse 62  Temp(Src) 36.2 C (Oral)  Resp 16  Ht 5\' 10"  (1.778 m)  Wt 171 lb 8 oz (77.792 kg)  BMI 24.61 kg/m2  SpO2 97%  Post vital signs: Reviewed  Level of consciousness: sedated  Complications: No apparent anesthesia complications

## 2014-02-11 ENCOUNTER — Encounter (HOSPITAL_BASED_OUTPATIENT_CLINIC_OR_DEPARTMENT_OTHER): Payer: Self-pay | Admitting: Urology

## 2014-02-11 NOTE — Op Note (Signed)
NAME:  Anthony [name], Joniel              ACCOUNT NO.:  [id number]  MEDICAL RECORD NO.:  [id number]  LOCATION:                                 FACILITY:  PHYSICIAN:  Excell SeltzerJohn J. Annabell HowellsWrenn, M.D.    DATE OF BIRTH:  March 22, 1951  DATE OF PROCEDURE:  02/07/2014 DATE OF DISCHARGE:                              OPERATIVE REPORT   PROCEDURE:  Cystoscopy with removal of right double-J stent, right ureteroscopy with holmium laser, and extraction of stone and placement of right double-J stent.  PREOPERATIVE DIAGNOSIS:  Right renal stone fragments.  POSTOPERATIVE DIAGNOSIS:  Right renal stone fragments.  SURGEON:  Excell SeltzerJohn J. Annabell HowellsWrenn, M.D.  ANESTHESIA:  General.  SPECIMENS:  Multiple stone fragments.  DRAINS:  A 6-French 24 cm double-J stent.  COMPLICATIONS:  Bleeding from right kidney.  INDICATIONS:  Mr. Sharene SkeansWelker is a 63 year old male with recent lithotripsy of right renal stone.  He had what appeared to be good fragmentations during treatment, but postoperatively developed Steinstrasse requiring stone extraction and placement of stent.  On followup x-ray in the office, he had residual large fragments in the lower pole that appeared to be about 6 mm, and it was felt that secondary ureteroscopy was indicated to complete extraction of the stone fragments.  FINDINGS OF PROCEDURE:  He was taken to the operating room, where he was given Cipro.  He was given general anesthetic, placed in lithotomy position, and fitted with PAS hose.  The perineum and genitalia were prepped with Betadine solution and draped in usual sterile fashion.  Cystoscopy was performed using a 22-French scope and 12-degree lens. The stent again was visualized and grasped and pulled the urethral meatus.  A guidewire was then passed to the kidney under fluoroscopic guidance.  A 12, 14, 38 cm ureteroscopic access sheath was then passed initially at just the inner core and then the assembled sheath to the kidney without difficulty.  The  guidewire and inner core were removed.  At this point, he was noted to have some fairly brisk bleeding through the sheath.  The 9-French ACMI digital flexible ureteroscope was then assembled and inserted and advanced to the kidney.  Initially inspection was difficult due to the bleeding from what appeared to be a mid upper pole calyx, however, eventually the bleeding abated and I was able to find fragments in the lower pole consistent with a fluoroscopic view.  These fragments were then removed with an NGage basket.  There were a couple of fragments that were too large to bring down intact, these were then subsequently engaged with a 200 micron TracTip Holmium laser fiber with settings of 0.5 watts and 10 Hz.  Eventually the power of it was increased to 1 watt.  Once these fragments were removed, fluoroscopy revealed some residual fragments in the mid pole calyx.  I was eventually able to identify these, they were in the area of bleeding and were adjacent to a large clot.  Several of these were removed with the NGage basket, but eventually I changed to the NCompass basket, which allowed more complete removal of the smaller fragments.  Finally, there was a residual stone noted that I could not readily visualize and it  was felt to likely be within the clot, so the NGage basket was then used to grab the clot and pull it from the collecting system with successful removal of all of the remaining fluoroscopically visible stones.  Final inspection of the collecting system revealed some very small fragments that were felt to be eminently passable and not requiring removal.  At this point, a guidewire was reinserted through the ureteroscope into the lower pole to avoid re-injury of the area of bleeding.  The access sheath was removed.  The cystoscope was replaced over the wire and a 6- French 24 cm double-J stent with no string was then inserted to the kidney under fluoroscopic guidance.  The  wire was removed leaving a good coil in the kidney and a good coil in the bladder.  The bladder was drained, the cystoscope was removed.  The patient was taken down from lithotomy position after placement of B and O suppository.  His anesthetic was reversed and he was moved to the recovery room in stable condition.  There were no complications other than excess bleeding from the initial wire placement, but that had abated at the end of the procedure.     Excell SeltzerJohn J. Annabell HowellsWrenn, M.D.     JJW/MEDQ  D:  02/08/2014  T:  02/08/2014  Job:  [id number]

## 2015-02-04 ENCOUNTER — Encounter (HOSPITAL_COMMUNITY)
Admission: RE | Disposition: A | Discharge status: Home / Self Care | Payer: Self-pay | Source: Ambulatory Visit | Attending: Urology

## 2015-02-04 ENCOUNTER — Encounter (HOSPITAL_COMMUNITY): Payer: Self-pay | Admitting: *Deleted

## 2015-02-04 ENCOUNTER — Ambulatory Visit (HOSPITAL_COMMUNITY): Payer: BLUE CROSS/BLUE SHIELD | Admitting: Certified Registered Nurse Anesthetist

## 2015-02-04 ENCOUNTER — Other Ambulatory Visit: Payer: Self-pay | Admitting: Urology

## 2015-02-04 ENCOUNTER — Observation Stay (HOSPITAL_COMMUNITY)
Admission: RE | Admit: 2015-02-04 | Discharge: 2015-02-05 | Disposition: A | Discharge status: Home / Self Care | Payer: BLUE CROSS/BLUE SHIELD | Source: Ambulatory Visit | Attending: Urology | Admitting: Urology

## 2015-02-04 DIAGNOSIS — K219 Gastro-esophageal reflux disease without esophagitis: Secondary | ICD-10-CM | POA: Diagnosis not present

## 2015-02-04 DIAGNOSIS — N201 Calculus of ureter: Secondary | ICD-10-CM | POA: Diagnosis present

## 2015-02-04 DIAGNOSIS — Z881 Allergy status to other antibiotic agents status: Secondary | ICD-10-CM | POA: Diagnosis not present

## 2015-02-04 DIAGNOSIS — Z885 Allergy status to narcotic agent status: Secondary | ICD-10-CM | POA: Insufficient documentation

## 2015-02-04 DIAGNOSIS — N202 Calculus of kidney with calculus of ureter: Secondary | ICD-10-CM | POA: Diagnosis not present

## 2015-02-04 DIAGNOSIS — Z79899 Other long term (current) drug therapy: Secondary | ICD-10-CM | POA: Insufficient documentation

## 2015-02-04 DIAGNOSIS — Z87442 Personal history of urinary calculi: Secondary | ICD-10-CM | POA: Diagnosis not present

## 2015-02-04 DIAGNOSIS — M1990 Unspecified osteoarthritis, unspecified site: Secondary | ICD-10-CM | POA: Diagnosis not present

## 2015-02-04 DIAGNOSIS — Z87891 Personal history of nicotine dependence: Secondary | ICD-10-CM | POA: Diagnosis not present

## 2015-02-04 DIAGNOSIS — N183 Chronic kidney disease, stage 3 unspecified: Secondary | ICD-10-CM

## 2015-02-04 HISTORY — PX: CYSTOSCOPY WITH RETROGRADE PYELOGRAM, URETEROSCOPY AND STENT PLACEMENT: SHX5789

## 2015-02-04 SURGERY — CYSTOURETEROSCOPY, WITH RETROGRADE PYELOGRAM AND STENT INSERTION
Anesthesia: General | Site: Ureter

## 2015-02-04 MED ORDER — HYDROMORPHONE HCL 2 MG PO TABS
2.0000 mg | ORAL_TABLET | ORAL | Status: DC | PRN
  Administered 2015-02-05: 2 mg via ORAL
  Filled 2015-02-04: qty 1

## 2015-02-04 MED ORDER — FENTANYL CITRATE (PF) 100 MCG/2ML IJ SOLN
INTRAMUSCULAR | Status: DC | PRN
  Administered 2015-02-04: 50 ug via INTRAVENOUS

## 2015-02-04 MED ORDER — BACITRACIN-NEOMYCIN-POLYMYXIN 400-5-5000 EX OINT: 1.0000 "application " | TOPICAL_OINTMENT | Freq: Three times a day (TID) | CUTANEOUS | Status: DC | PRN

## 2015-02-04 MED ORDER — ONDANSETRON HCL 4 MG/2ML IJ SOLN
INTRAMUSCULAR | Status: AC
  Filled 2015-02-04: qty 2

## 2015-02-04 MED ORDER — PANTOPRAZOLE SODIUM 40 MG PO TBEC
40.0000 mg | DELAYED_RELEASE_TABLET | Freq: Every day | ORAL | Status: DC
  Administered 2015-02-05: 40 mg via ORAL
  Filled 2015-02-04: qty 1

## 2015-02-04 MED ORDER — PHENAZOPYRIDINE HCL 200 MG PO TABS
200.0000 mg | ORAL_TABLET | Freq: Three times a day (TID) | ORAL | Status: DC | PRN
  Filled 2015-02-04: qty 1

## 2015-02-04 MED ORDER — LIDOCAINE HCL (CARDIAC) 20 MG/ML IV SOLN
INTRAVENOUS | Status: AC
  Filled 2015-02-04: qty 5

## 2015-02-04 MED ORDER — PROPOFOL 10 MG/ML IV BOLUS
INTRAVENOUS | Status: AC
  Filled 2015-02-04: qty 20

## 2015-02-04 MED ORDER — ZOLPIDEM TARTRATE 5 MG PO TABS: 5.0000 mg | ORAL_TABLET | Freq: Every evening | ORAL | Status: DC | PRN

## 2015-02-04 MED ORDER — SULFAMETHOXAZOLE-TRIMETHOPRIM 800-160 MG PO TABS
1.0000 | ORAL_TABLET | Freq: Two times a day (BID) | ORAL | Status: DC
  Administered 2015-02-05 (×2): 1 via ORAL
  Filled 2015-02-04 (×2): qty 1

## 2015-02-04 MED ORDER — NAPROXEN 250 MG PO TABS
250.0000 mg | ORAL_TABLET | Freq: Two times a day (BID) | ORAL | Status: DC
  Filled 2015-02-04 (×3): qty 1

## 2015-02-04 MED ORDER — LACTATED RINGERS IV SOLN
INTRAVENOUS | Status: DC
  Administered 2015-02-04: 23:00:00 via INTRAVENOUS

## 2015-02-04 MED ORDER — SODIUM CHLORIDE 0.45 % IV SOLN
INTRAVENOUS | Status: DC
  Administered 2015-02-05: via INTRAVENOUS

## 2015-02-04 MED ORDER — LIDOCAINE HCL (CARDIAC) 20 MG/ML IV SOLN
INTRAVENOUS | Status: DC | PRN
  Administered 2015-02-04: 100 mg via INTRAVENOUS

## 2015-02-04 MED ORDER — IOHEXOL 300 MG/ML  SOLN
INTRAMUSCULAR | Status: DC | PRN
  Administered 2015-02-04: 10 mL via INTRAVENOUS

## 2015-02-04 MED ORDER — LACTATED RINGERS IV SOLN
INTRAVENOUS | Status: DC
  Administered 2015-02-04: 1000 mL via INTRAVENOUS

## 2015-02-04 MED ORDER — SENNA 8.6 MG PO TABS
1.0000 | ORAL_TABLET | Freq: Two times a day (BID) | ORAL | Status: DC
  Administered 2015-02-05: 8.6 mg via ORAL
  Filled 2015-02-04 (×2): qty 1

## 2015-02-04 MED ORDER — FENTANYL CITRATE (PF) 100 MCG/2ML IJ SOLN
INTRAMUSCULAR | Status: AC
  Filled 2015-02-04: qty 2

## 2015-02-04 MED ORDER — ONDANSETRON HCL 4 MG/2ML IJ SOLN
INTRAMUSCULAR | Status: DC | PRN
  Administered 2015-02-04: 4 mg via INTRAVENOUS

## 2015-02-04 MED ORDER — DIPHENHYDRAMINE HCL 12.5 MG/5ML PO ELIX: 12.5000 mg | ORAL_SOLUTION | Freq: Four times a day (QID) | ORAL | Status: DC | PRN

## 2015-02-04 MED ORDER — MEPERIDINE HCL 50 MG/ML IJ SOLN: 6.2500 mg | INTRAMUSCULAR | Status: DC | PRN

## 2015-02-04 MED ORDER — FENTANYL CITRATE (PF) 100 MCG/2ML IJ SOLN
25.0000 ug | INTRAMUSCULAR | Status: DC | PRN
  Administered 2015-02-04: 25 ug via INTRAVENOUS

## 2015-02-04 MED ORDER — PROPOFOL 10 MG/ML IV BOLUS
INTRAVENOUS | Status: DC | PRN
  Administered 2015-02-04: 200 mg via INTRAVENOUS

## 2015-02-04 MED ORDER — DIPHENHYDRAMINE HCL 50 MG/ML IJ SOLN: 12.5000 mg | Freq: Four times a day (QID) | INTRAMUSCULAR | Status: DC | PRN

## 2015-02-04 MED ORDER — CEFAZOLIN SODIUM-DEXTROSE 2-3 GM-% IV SOLR
INTRAVENOUS | Status: AC
  Filled 2015-02-04: qty 50

## 2015-02-04 MED ORDER — LIDOCAINE HCL 2 % EX GEL
CUTANEOUS | Status: AC
  Filled 2015-02-04: qty 10

## 2015-02-04 MED ORDER — ACETAMINOPHEN 325 MG PO TABS: 650.0000 mg | ORAL_TABLET | ORAL | Status: DC | PRN

## 2015-02-04 MED ORDER — PROMETHAZINE HCL 25 MG/ML IJ SOLN: 6.2500 mg | INTRAMUSCULAR | Status: DC | PRN

## 2015-02-04 MED ORDER — CEFAZOLIN SODIUM-DEXTROSE 2-3 GM-% IV SOLR
INTRAVENOUS | Status: DC | PRN
  Administered 2015-02-04: 2 g via INTRAVENOUS

## 2015-02-04 SURGICAL SUPPLY — 18 items
BAG URO CATCHER STRL LF (DRAPE) ×4 IMPLANT
CATH INTERMIT  6FR 70CM (CATHETERS) ×4 IMPLANT
CLOTH BEACON ORANGE TIMEOUT ST (SAFETY) ×4 IMPLANT
FIBER LASER FLEXIVA 1000 (UROLOGICAL SUPPLIES) IMPLANT
FIBER LASER FLEXIVA 200 (UROLOGICAL SUPPLIES) IMPLANT
FIBER LASER FLEXIVA 365 (UROLOGICAL SUPPLIES) IMPLANT
FIBER LASER FLEXIVA 550 (UROLOGICAL SUPPLIES) IMPLANT
FIBER LASER TRAC TIP (UROLOGICAL SUPPLIES) IMPLANT
GLOVE BIOGEL M STRL SZ7.5 (GLOVE) ×4 IMPLANT
GOWN STRL REUS W/TWL XL LVL3 (GOWN DISPOSABLE) ×4 IMPLANT
GUIDEWIRE STR DUAL SENSOR (WIRE) ×4 IMPLANT
MANIFOLD NEPTUNE II (INSTRUMENTS) ×4 IMPLANT
NS IRRIG 1000ML POUR BTL (IV SOLUTION) ×4 IMPLANT
PACK CYSTO (CUSTOM PROCEDURE TRAY) ×4 IMPLANT
SCRUB PCMX 4 OZ (MISCELLANEOUS) ×4 IMPLANT
STENT URET 6FRX24 CONTOUR (STENTS) ×8 IMPLANT
TUBING CONNECTING 10 (TUBING) ×3 IMPLANT
TUBING CONNECTING 10' (TUBING) ×1

## 2015-02-04 NOTE — Transfer of Care (Signed)
Immediate Anesthesia Transfer of Care Note  Patient: Anthony [name]  Procedure(s) Performed: Procedure(s): CYSTOSCOPY WITH RETROGRADE PYELOGRAM, URETEROSCOPY AND STENT PLACEMENT, BILATERAL (Bilateral)  Patient Location: PACU  Anesthesia Type:General  Level of Consciousness: awake, alert , oriented and patient cooperative  Airway & Oxygen Therapy: Patient Spontanous Breathing and Patient connected to face mask oxygen  Post-op Assessment: Report given to RN, Post -op Vital signs reviewed and stable and Patient moving all extremities  Post vital signs: Reviewed and stable  Last Vitals:  Filed Vitals:   02/04/15 1605  BP: 175/85  Pulse: 66  Temp: 36.7 C  Resp: 18    Complications: No apparent anesthesia complications

## 2015-02-04 NOTE — Op Note (Signed)
Pre-operative diagnosis :  Bilateral ureteral stones obstruction  Postoperative diagnosis: Same    Operation:  Cystourethroscopy, bilateral retrograde pyelogram interpretation, bilateral double-J stent Princess 6 [place] by 24 cm).   Surgeon:  Kathie Rhodes. Patsi Sears, MD  First assistant: None     Anesthesia:  General LMA   Preparation:  After appropriate pre-anesthesia, the patient was brought to the operative room, placed on the operating table in the dorsal supine position where general alum A anesthesia was introduced. He was replaced in dorsal lithotomy position with the penis was prepped with Betadine solution and draped in usual fashion. The x-rays were reviewed. The arm band was removed reviewed.   Review history:   64 YO male patient of Dr. Belva Crome seen today for RLQ pain with n/v.   GU hx: 2015 S/P ESWL right ureteroscopy for retained fragments. He has a stent that needs to be removed.   Hx of right renal stone. He had ESWL on 01/17/14 but had a Steinstrasse that required ureteroscopy and stenting on 01/24/14. He is having pain with the stent. KUB June 2015 shows the stent in good position. There is a cluster of stones in the RLQ with a possible 6mm fragment remaining. \  June 2016 Interval Hx:  Developed sxs about 3 days ago after a long distance motorcycle ride. Pain is 4/10 with PRN Naproxen. Denies f/c or hematuria.    Statement of  Likelihood of Success: Excellent. TIME-OUT observed.:  Procedure:  Cystourethroscopy was accomplished. The patient is uncircumcised. The pendulous urethra was normal. The proximal urethra was normal. The prostate was bilobar, but not occlusive. The bladder neck was normal. Bladder base was normal and the trigone was normal. The right ureteral orifice was easily identified. A 6 open-end catheter was placed, and right retrograde pyelogram was performed. Stone was identified in the upper ureter. An 0.038 guidewire was passed through the right ureter,  and around the stone into the renal pelvis and coiled. Over this, a 6 [place] by 24 cm double-J stent was passed, and called in the renal pelvis. The stent was also coiled in the bladder. The wire was removed. The bladder was drained of fluid. Repeat cystoscopy was a Cobb's, and the left ureteral orifice was identified. Left [place] with PolyGram showed stone in the low ureter. There was also stone in the kidney noted. A 0.038 guidewire was passed around the stone, through the ureter, and coiled in the renal pelvis. Over this, a 6 [place] by 24 cm double-J stent was coiled in the renal pelvis, and then coiled in the bladder. The bladder was drained of fluid. The patient was awakened and taken to recovery room in good condition.

## 2015-02-04 NOTE — H&P (Signed)
Reason For Visit     Seen today for RLQ pain with N/V.   Active Problems Problems   1. Abdominal pain, RLQ (right lower quadrant) (R10.31)   Assessed By: Jetta Lout (Urology); Last Assessed: 04 Feb 2015  2. Bilateral kidney stones (N20.0)   Assessed By: Jetta Lout (Urology); Last Assessed: 04 Feb 2015  3. Hydronephrosis, bilateral (N13.30)   Assessed By: Jetta Lout (Urology); Last Assessed: 04 Feb 2015  4. Microscopic hematuria (R31.2)   Assessed By: Jetta Lout (Urology); Last Assessed: 04 Feb 2015  5. Nausea and vomiting (R11.2)   Assessed By: Jetta Lout (Urology); Last Assessed: 04 Feb 2015  6. Ureteral calculus, left (N20.1)   Assessed By: Jetta Lout (Urology); Last Assessed: 04 Feb 2015  7. Ureteral calculus, right (N20.1)   Assessed By: Jetta Lout (Urology); Last Assessed: 04 Feb 2015  History of Present Illness           64 YO male patient of Dr. Belva Crome seen today for RLQ pain with n/v.   GU hx: 2015 S/P ESWL right ureteroscopy for retained fragments.  He has a stent that needs to be removed.   Hx of right renal stone.  He had ESWL on 01/17/14 but had a Steinstrasse that required ureteroscopy and stenting on 01/24/14.   He is having pain with the stent.   KUB June 2015 shows the stent in good position.  There is a cluster of stones in the RLQ with a possible 48mm fragment remaining. \  June 2016 Interval Hx:  Developed sxs about 3 days ago after a long distance motorcycle ride. Pain is 4/10 with PRN Naproxen. Denies f/c or hematuria.   Past Medical History Problems   1. History of Arthritis  2. History of Calculus of right ureter (N20.1)  3. History of Calculus of right ureter (N20.1)  4. History of Calculus of ureter (N20.1)  5. History of Hematoma Of The Left Kidney  6. History of acute pyelonephritis (Z87.440)  7. History of kidney stones (Z87.442)  8. History of Hydronephrosis, right (N13.30)  9. History of Lumbar radiculopathy (M54.16)   10. 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 History of Arm Incision  2. History of Back Surgery  3. History of Cystoscopy With Insertion Of Ureteral Stent Left  4. History of Cystoscopy With Insertion Of Ureteral Stent Right  5. History of Cystoscopy With Insertion Of Ureteral Stent Right  6. History of Cystoscopy With Insertion Of Ureteral Stent Right  7. History of Cystoscopy With Ureteroscopy Right  8. History of Cystoscopy With Ureteroscopy With Lithotripsy  9. History of Cystoscopy With Ureteroscopy With Lithotripsy  10. History of Cystoscopy With Ureteroscopy With Lithotripsy  11. History of Cystoscopy With Ureteroscopy With Removal Of Calculus  12. History of Lithotripsy  13. History of Lithotripsy  14. History of Lithotripsy  15. History of Lower Back Surgery  16. History of Neck Surgery  Current Meds  1. HYDROmorphone HCl - 2 MG Oral Tablet; TAKE 1 TABLET EVERY 4 TO 6 HOURS AS  NEEDED FOR PAIN;  Therapy: 14May2015 to (Evaluate:18May2015); Last Rx:14May2015 Ordered  2. Naproxen 500 MG Oral Tablet; TAKE TABLET  PRN;  Therapy: (Recorded:14May2015) to Recorded  3. Omeprazole CPDR;  Therapy: (Recorded:14May2015) to Recorded  4. Promethazine HCl - 25 MG Oral Tablet; TAKE 1 TABLET Every 6 hours PRN;  Therapy: 22Jun2015 to (Last Rx:22Jun2015)  Requested for: 23Jun2015 Ordered  5. Tamsulosin HCl - 0.4 MG Oral Capsule; TAKE  1 CAPSULE Daily;  Therapy: 01Jun2015 to (Evaluate:01Jul2015)  Requested for: 01Jun2015; Last  Rx:01Jun2015 Ordered  Allergies Medication   1. Codeine Derivatives  2. Doxycycline Hyclate CAPS  Family History Problems   1. Family history of Colon Cancer : Mother  2. Family history of Colon Cancer : Maternal Aunt  3. Family history of Colon Cancer : Maternal Aunt  4. Family history of Colon Cancer : Maternal Uncle  5. Family history of Death In The Family Aunt : Maternal Aunt   died age 65-colon cancer  6. Family history  of Death In The Family Aunt : Maternal Aunt   died age -61 colon cancer  7. Family history of Death In The Family Father   died age 79-mini strokes  8. Family history of Death In The Family Mother   died age 66-colon cancer  9. Family history of Family Health Status Number Of Children   1 daughter and 2 sons  10. Family history of Hematuria : Mother  11. Family history of Nephrolithiasis : Mother  12. Family history of Stroke Syndrome : Mother  13. Family history of Stroke Syndrome : Father  Social History Problems   1. Being A Social Drinker  2. Caffeine Use  3. Former Smoker   smoked a pipe for about 3 yrs and quit 4 yrs  4. Marital History - Widowed  5. Occupation:   truck driver/labor  Review of Systems Genitourinary, constitutional, skin, eye, otolaryngeal, hematologic/lymphatic, cardiovascular, pulmonary, endocrine, musculoskeletal, gastrointestinal, neurological and psychiatric system(s) were reviewed and pertinent findings if present are noted and are otherwise negative.  Gastrointestinal: nausea, vomiting and abdominal pain.    Vitals Vital Signs [Data Includes: Last 1 Day]  Recorded: 14Jun2016 01:34PM  Blood Pressure: 166 / 84 Temperature: 98 F Heart Rate: 68  Physical Exam Constitutional: Well nourished and well developed . No acute distress. The patient appears well hydrated.  ENT:. Examination of the teeth show poor dentition.  Neck: The appearance of the neck is normal.  Pulmonary: No respiratory distress.  Cardiovascular: Heart rate and rhythm are normal.  Abdomen: The abdomen is rounded. The abdomen is soft and nontender. No tenderness in the RLQ and no LLQ tenderness. No CVA tenderness.  Skin: Normal skin turgor.  Neuro/Psych:. Mood and affect are appropriate.    Results/Data Urine [Data Includes: Last 1 Day]   14Jun2016  COLOR YELLOW   APPEARANCE CLOUDY   SPECIFIC GRAVITY 1.020   pH 5.5   GLUCOSE NEG mg/dL  BILIRUBIN NEG   KETONE NEG mg/dL   BLOOD LARGE   PROTEIN TRACE mg/dL  UROBILINOGEN 0.2 mg/dL  NITRITE NEG   LEUKOCYTE ESTERASE SMALL   SQUAMOUS EPITHELIAL/HPF RARE   WBC 11-20 WBC/hpf  RBC TNTC RBC/hpf  BACTERIA RARE   CRYSTALS NONE SEEN   CASTS NONE SEEN   Other MUCUS NOTED    The following images/tracing/specimen were independently visualized:  CT urogram: shows bilateral hydronephrosis secondary to large left UPJ calculus. On scout film it appears to be 2 stones on the left; also has right hydronephrosis secondary to distal right 5 mm ureteral calculus at right iliac crest.  The following clinical lab reports were reviewed:  UA: appears infected with significant WBC and RBC noted Will culture BUN/creatine shows AKI with elevated creatine.    Assessment Assessed   1. Microscopic hematuria (R31.2)  2. Abdominal pain, RLQ (right lower quadrant) (R10.31)  3. Nausea and vomiting (R11.2)  4. Bilateral kidney stones (N20.0)  5. Hydronephrosis, bilateral (N13.30)    6. Ureteral calculus, left (N20.1)  7. Ureteral calculus, right (N20.1)  8. Acute renal failure (N17.9)  Plan  Abdominal pain, RLQ (right lower quadrant), Bilateral kidney stones, Nausea and vomiting   1. Administered: Ketorolac Tromethamine 30 MG/ML Injection Solution Bilateral kidney stones   2. VENIPUNCTURE; Status:Complete;   Done: 14Jun2016 Nausea and vomiting   3. AU CT-STONE PROTOCOL; Status:In Progress - Specimen/Data Collected;   Done:  14Jun2016 02:43PM         Culture urine. No ABX unless culture proven UTI Ketorolac 30 mg IM today Stat BUN/creatine Discussed with Dr. Patsi Searsannenbaum. Will proceed with urgent cystourethroscopy, B RPG, and B double J stent placement and admission to follow renal function. Pt and wife were instructed of procedure and risk which include general anesthesia, infection, hematuria, and possible injury to bladder or ureter. All questions addressed and he wishes to proceed.   URINE CULTURE; Status:Hold For - Specimen/Data  Collection,Appointment; Requested for:14Jun2016;  Perform:[facility]; Due:16Jun2016; Marked Important;Ordered; Today;   ZOX:[id number]:Microscopic hematuria; Ordered XB:JYNWGNBy:Warden, Diane;   Discussion/Summary    CC: Dr. Windle GuardWilson Elkins.

## 2015-02-04 NOTE — Anesthesia Procedure Notes (Signed)
Procedure Name: LMA Insertion Date/Time: 02/04/2015 9:22 PM Performed by: Jarvis Newcomer A Pre-anesthesia Checklist: Patient identified, Emergency Drugs available, Suction available, Patient being monitored and Timeout performed Patient Re-evaluated:Patient Re-evaluated prior to inductionOxygen Delivery Method: Circle system utilized Preoxygenation: Pre-oxygenation with 100% oxygen Intubation Type: IV induction LMA: LMA inserted LMA Size: 4.0 Number of attempts: 1 Placement Confirmation: positive ETCO2 and breath sounds checked- equal and bilateral Tube secured with: Tape Dental Injury: Teeth and Oropharynx as per pre-operative assessment

## 2015-02-04 NOTE — Interval H&P Note (Signed)
History and Physical Interval Note:  02/04/2015 9:05 PM  Anthony [name]  has presented today for surgery, with the diagnosis of bilateral hydronephrosis  The various methods of treatment have been discussed with the patient and family. After consideration of risks, benefits and other options for treatment, the patient has consented to  Procedure(s): CYSTOSCOPY WITH RETROGRADE PYELOGRAM, URETEROSCOPY AND STENT PLACEMENT (Bilateral) HOLMIUM LASER APPLICATION (N/A) as a surgical intervention .  The patient's history has been reviewed, patient examined, no change in status, stable for surgery.  I have reviewed the patient's chart and labs.  Questions were answered to the patient's satisfaction.     [name] I Anthony [name]

## 2015-02-04 NOTE — Anesthesia Preprocedure Evaluation (Addendum)
Anesthesia Evaluation  Patient identified by MRN, date of birth, ID band Patient awake    Reviewed: Allergy & Precautions, H&P , NPO status , Patient's Chart, lab work & pertinent test results  Airway Mallampati: II  TM Distance: >3 FB Neck ROM: Full    Dental  (+) Missing, Poor Dentition, Dental Advisory Given   Pulmonary neg pulmonary ROS, former smoker,  breath sounds clear to auscultation  Pulmonary exam normal       Cardiovascular negative cardio ROS Normal cardiovascular examRhythm:Regular Rate:Normal     Neuro/Psych negative neurological ROS  negative psych ROS   GI/Hepatic negative GI ROS, Neg liver ROS,   Endo/Other  negative endocrine ROS  Renal/GU Renal InsufficiencyRenal disease  negative genitourinary   Musculoskeletal negative musculoskeletal ROS (+)   Abdominal   Peds negative pediatric ROS (+)  Hematology negative hematology ROS (+)   Anesthesia Other Findings   Reproductive/Obstetrics negative OB ROS                           Anesthesia Physical  Anesthesia Plan  ASA: II  Anesthesia Plan: General   Post-op Pain Management:    Induction: Intravenous  Airway Management Planned: LMA  Additional Equipment:   Intra-op Plan:   Post-operative Plan: Extubation in OR  Informed Consent: I have reviewed the patients History and Physical, chart, labs and discussed the procedure including the risks, benefits and alternatives for the proposed anesthesia with the patient or authorized representative who has indicated his/her understanding and acceptance.   Dental advisory given  Plan Discussed with: CRNA and Surgeon  Anesthesia Plan Comments:         Anesthesia Quick Evaluation

## 2015-02-04 NOTE — Progress Notes (Signed)
Pt received from Hubert Azure RN.  Pt awake, alert and in no distress.

## 2015-02-05 ENCOUNTER — Encounter (HOSPITAL_COMMUNITY): Payer: Self-pay | Admitting: Urology

## 2015-02-05 DIAGNOSIS — N201 Calculus of ureter: Secondary | ICD-10-CM | POA: Diagnosis present

## 2015-02-05 DIAGNOSIS — N202 Calculus of kidney with calculus of ureter: Secondary | ICD-10-CM | POA: Diagnosis not present

## 2015-02-05 DIAGNOSIS — N183 Chronic kidney disease, stage 3 unspecified: Secondary | ICD-10-CM

## 2015-02-05 LAB — CBC WITH DIFFERENTIAL/PLATELET
Basophils Absolute: 0.1 10*3/uL (ref 0.0–0.1)
Basophils Relative: 2 % — ABNORMAL HIGH (ref 0–1)
Eosinophils Absolute: 0.3 10*3/uL (ref 0.0–0.7)
Eosinophils Relative: 6 % — ABNORMAL HIGH (ref 0–5)
HCT: 42 % (ref 39.0–52.0)
Hemoglobin: 13.8 g/dL (ref 13.0–17.0)
Lymphocytes Relative: 28 % (ref 12–46)
Lymphs Abs: 1.4 10*3/uL (ref 0.7–4.0)
MCH: 27.9 pg (ref 26.0–34.0)
MCHC: 32.9 g/dL (ref 30.0–36.0)
MCV: 84.8 fL (ref 78.0–100.0)
Monocytes Absolute: 0.6 10*3/uL (ref 0.1–1.0)
Monocytes Relative: 12 % (ref 3–12)
Neutro Abs: 2.8 10*3/uL (ref 1.7–7.7)
Neutrophils Relative %: 52 % (ref 43–77)
Platelets: 149 10*3/uL — ABNORMAL LOW (ref 150–400)
RBC: 4.95 MIL/uL (ref 4.22–5.81)
RDW: 14.2 % (ref 11.5–15.5)
WBC: 5.2 10*3/uL (ref 4.0–10.5)

## 2015-02-05 LAB — COMPREHENSIVE METABOLIC PANEL
ALT: 33 U/L (ref 17–63)
AST: 30 U/L (ref 15–41)
Albumin: 3.3 g/dL — ABNORMAL LOW (ref 3.5–5.0)
Alkaline Phosphatase: 92 U/L (ref 38–126)
Anion gap: 7 (ref 5–15)
BUN: 35 mg/dL — ABNORMAL HIGH (ref 6–20)
CO2: 27 mmol/L (ref 22–32)
Calcium: 8.5 mg/dL — ABNORMAL LOW (ref 8.9–10.3)
Chloride: 107 mmol/L (ref 101–111)
Creatinine, Ser: 2.1 mg/dL — ABNORMAL HIGH (ref 0.61–1.24)
GFR calc Af Amer: 37 mL/min — ABNORMAL LOW (ref >60)
GFR calc non Af Amer: 32 mL/min — ABNORMAL LOW (ref >60)
Glucose, Bld: 125 mg/dL — ABNORMAL HIGH (ref 65–99)
Potassium: 4 mmol/L (ref 3.5–5.1)
Sodium: 141 mmol/L (ref 135–145)
Total Bilirubin: 0.8 mg/dL (ref 0.3–1.2)
Total Protein: 6 g/dL — ABNORMAL LOW (ref 6.5–8.1)

## 2015-02-05 MED ORDER — PROMETHAZINE HCL 25 MG PO TABS
25.0000 mg | ORAL_TABLET | Freq: Four times a day (QID) | ORAL | Status: DC | PRN
Start: 2015-02-05 — End: 2015-02-20

## 2015-02-05 MED ORDER — CETYLPYRIDINIUM CHLORIDE 0.05 % MT LIQD: 7.0000 mL | Freq: Two times a day (BID) | OROMUCOSAL | Status: DC

## 2015-02-05 MED ORDER — HYDROMORPHONE HCL 2 MG PO TABS: 2.0000 mg | ORAL_TABLET | ORAL | Status: DC | PRN

## 2015-02-05 MED ORDER — SULFAMETHOXAZOLE-TRIMETHOPRIM 800-160 MG PO TABS: 1.0000 | ORAL_TABLET | Freq: Every day | ORAL | Status: DC

## 2015-02-05 NOTE — Discharge Instructions (Signed)

## 2015-02-05 NOTE — Discharge Summary (Signed)
Physician Discharge Summary  Patient ID: Anthony [name] MRN: [id number] DOB/AGE: May 05, 1951 64 y.o.  Admit date: 02/04/2015 Discharge date: 02/05/2015  Admission Diagnoses:  Right ureteral calculus  Discharge Diagnoses:  Principal Problem:   Right ureteral calculus Active Problems:   Ureteral stone   Left ureteral calculus   Renal insufficiency   Past Medical History  Diagnosis Date  . GERD (gastroesophageal reflux disease)   . Arthritis   . H/O mitral valve prolapse 25 years ago    asymptomatic and does not see cardiologist.   . Kidney stones     right renal stones  . Nausea     related to kidney stone  . Hematuria     Surgeries: Procedure(s): CYSTOSCOPY WITH RETROGRADE PYELOGRAM, URETEROSCOPY AND STENT PLACEMENT, BILATERAL on 02/04/2015   Consultants (if any):    Discharged Condition: Improved  Hospital Course: Anthony [name] is an 64 y.o. male who was admitted 02/04/2015 with a diagnosis of Right ureteral calculus and left proximal ureteral stones and went to the operating room on 02/04/2015 and underwent the above named procedures.  He is feeling better today with no further pain. His Cr is elevated at 2.1.  He probably has some baseline CRI.   He will be discharged today but will need further treatment of his stones.  He has 2 larger stones in the left proximal ureter that will need ESWL and a proximal right ureteral stone that will need ureteroscopy.   He was given perioperative antibiotics:      Anti-infectives    Start     Dose/Rate Route Frequency Ordered Stop   02/05/15 0000  sulfamethoxazole-trimethoprim (BACTRIM DS,SEPTRA DS) 800-160 MG per tablet    Comments:  He needs 1/2 tab bid   1 tablet Oral Daily 02/05/15 0828     02/04/15 2315  sulfamethoxazole-trimethoprim (BACTRIM DS,SEPTRA DS) 800-160 MG per tablet 1 tablet     1 tablet Oral Every 12 hours 02/04/15 2312      .  He was given sequential compression devices for DVT prophylaxis.  He benefited  maximally from the hospital stay and there were no complications.    Recent vital signs:  Filed Vitals:   02/05/15 0450  BP: 114/71  Pulse: 52  Temp: 97.5 F (36.4 C)  Resp: 16    Recent laboratory studies:  Lab Results  Component Value Date   HGB 13.8 02/05/2015   HGB 17.0 02/07/2014   HGB 16.3 01/24/2014   Lab Results  Component Value Date   WBC 5.2 02/05/2015   PLT 149* 02/05/2015   Lab Results  Component Value Date   INR 1.00 09/03/2010   Lab Results  Component Value Date   NA 141 02/05/2015   K 4.0 02/05/2015   CL 107 02/05/2015   CO2 27 02/05/2015   BUN 35* 02/05/2015   CREATININE 2.10* 02/05/2015   GLUCOSE 125* 02/05/2015    Discharge Medications:     Medication List    STOP taking these medications        naproxen sodium 220 MG tablet  Commonly known as:  ANAPROX      TAKE these medications        HYDROmorphone 2 MG tablet  Commonly known as:  DILAUDID  Take 1 tablet (2 mg total) by mouth every 4 (four) hours as needed for moderate pain or severe pain.     omeprazole 20 MG tablet  Commonly known as:  PRILOSEC OTC  Take 20 mg by mouth daily.  phenazopyridine 200 MG tablet  Commonly known as:  PYRIDIUM  Take 1 tablet (200 mg total) by mouth 3 (three) times daily as needed for pain.     promethazine 25 MG tablet  Commonly known as:  PHENERGAN  Take 1 tablet (25 mg total) by mouth every 6 (six) hours as needed for nausea or vomiting.     sulfamethoxazole-trimethoprim 800-160 MG per tablet  Commonly known as:  BACTRIM DS,SEPTRA DS  Take 1 tablet by mouth daily.        Diagnostic Studies: No results found.  Disposition: 01-Home or Self Care    Follow-up Information    Follow up with Anner Crete, MD.   Specialty:  Urology   Why:  I will schedule his next treatment and let him know.    Contact information:   [address] [place] [address] [phone]        Signed: Anner Crete 02/05/2015, 8:29 AM

## 2015-02-06 ENCOUNTER — Other Ambulatory Visit: Payer: Self-pay | Admitting: Urology

## 2015-02-07 ENCOUNTER — Encounter (HOSPITAL_COMMUNITY): Payer: Self-pay

## 2015-02-07 NOTE — Progress Notes (Signed)
Spoke w Lawson Fiscal RN at [facility] about patient's MVP. In hx states dx 25 years ago, no problems anmd doesn't need a cardiologist.SHE SAID EKG ISN'T NECESSARY.

## 2015-02-07 NOTE — Anesthesia Postprocedure Evaluation (Signed)
  Anesthesia Post-op Note  Patient: Anthony [name]  Procedure(s) Performed: Procedure(s) (LRB): CYSTOSCOPY WITH RETROGRADE PYELOGRAM, URETEROSCOPY AND STENT PLACEMENT, BILATERAL (Bilateral)  Patient Location: PACU  Anesthesia Type: General  Level of Consciousness: awake and alert   Airway and Oxygen Therapy: Patient Spontanous Breathing  Post-op Pain: mild  Post-op Assessment: Post-op Vital signs reviewed, Patient's Cardiovascular Status Stable, Respiratory Function Stable, Patent Airway and No signs of Nausea or vomiting  Last Vitals:  Filed Vitals:   02/05/15 0450  BP: 114/71  Pulse: 52  Temp: 36.4 C  Resp: 16    Post-op Vital Signs: stable   Complications: No apparent anesthesia complications

## 2015-02-11 ENCOUNTER — Encounter (HOSPITAL_COMMUNITY): Payer: Self-pay | Admitting: *Deleted

## 2015-02-13 ENCOUNTER — Ambulatory Visit (HOSPITAL_COMMUNITY)
Admission: RE | Admit: 2015-02-13 | Discharge: 2015-02-13 | Disposition: A | Discharge status: Home / Self Care | Payer: BLUE CROSS/BLUE SHIELD | Source: Ambulatory Visit | Attending: Urology | Admitting: Urology

## 2015-02-13 ENCOUNTER — Ambulatory Visit (HOSPITAL_COMMUNITY): Payer: BLUE CROSS/BLUE SHIELD

## 2015-02-13 ENCOUNTER — Encounter (HOSPITAL_COMMUNITY): Payer: Self-pay

## 2015-02-13 ENCOUNTER — Encounter (HOSPITAL_COMMUNITY)
Admission: RE | Disposition: A | Discharge status: Home / Self Care | Payer: Self-pay | Source: Ambulatory Visit | Attending: Urology

## 2015-02-13 DIAGNOSIS — Z79891 Long term (current) use of opiate analgesic: Secondary | ICD-10-CM | POA: Insufficient documentation

## 2015-02-13 DIAGNOSIS — Z791 Long term (current) use of non-steroidal anti-inflammatories (NSAID): Secondary | ICD-10-CM | POA: Diagnosis not present

## 2015-02-13 DIAGNOSIS — N179 Acute kidney failure, unspecified: Secondary | ICD-10-CM | POA: Insufficient documentation

## 2015-02-13 DIAGNOSIS — Z841 Family history of disorders of kidney and ureter: Secondary | ICD-10-CM | POA: Insufficient documentation

## 2015-02-13 DIAGNOSIS — Z79899 Other long term (current) drug therapy: Secondary | ICD-10-CM | POA: Insufficient documentation

## 2015-02-13 DIAGNOSIS — Z87442 Personal history of urinary calculi: Secondary | ICD-10-CM | POA: Diagnosis not present

## 2015-02-13 DIAGNOSIS — R1031 Right lower quadrant pain: Secondary | ICD-10-CM | POA: Diagnosis present

## 2015-02-13 DIAGNOSIS — N132 Hydronephrosis with renal and ureteral calculous obstruction: Secondary | ICD-10-CM | POA: Insufficient documentation

## 2015-02-13 DIAGNOSIS — Z87891 Personal history of nicotine dependence: Secondary | ICD-10-CM | POA: Diagnosis not present

## 2015-02-13 DIAGNOSIS — N201 Calculus of ureter: Secondary | ICD-10-CM

## 2015-02-13 SURGERY — LITHOTRIPSY, ESWL
Anesthesia: LOCAL | Laterality: Left

## 2015-02-13 MED ORDER — SODIUM CHLORIDE 0.9 % IJ SOLN: 3.0000 mL | Freq: Two times a day (BID) | INTRAMUSCULAR | Status: DC

## 2015-02-13 MED ORDER — SODIUM CHLORIDE 0.9 % IJ SOLN: 3.0000 mL | INTRAMUSCULAR | Status: DC | PRN

## 2015-02-13 MED ORDER — SODIUM CHLORIDE 0.9 % IV SOLN
INTRAVENOUS | Status: DC
  Administered 2015-02-13: 08:00:00 via INTRAVENOUS

## 2015-02-13 MED ORDER — SODIUM CHLORIDE 0.9 % IV SOLN: 250.0000 mL | INTRAVENOUS | Status: DC | PRN

## 2015-02-13 MED ORDER — ACETAMINOPHEN 325 MG PO TABS: 650.0000 mg | ORAL_TABLET | ORAL | Status: DC | PRN

## 2015-02-13 MED ORDER — CIPROFLOXACIN HCL 500 MG PO TABS
500.0000 mg | ORAL_TABLET | ORAL | Status: AC
  Administered 2015-02-13: 500 mg via ORAL
  Filled 2015-02-13 (×2): qty 1

## 2015-02-13 MED ORDER — ACETAMINOPHEN 650 MG RE SUPP
650.0000 mg | RECTAL | Status: DC | PRN
  Filled 2015-02-13: qty 1

## 2015-02-13 MED ORDER — FENTANYL CITRATE (PF) 100 MCG/2ML IJ SOLN: 25.0000 ug | INTRAMUSCULAR | Status: DC | PRN

## 2015-02-13 MED ORDER — DIAZEPAM 5 MG PO TABS
10.0000 mg | ORAL_TABLET | ORAL | Status: AC
  Administered 2015-02-13: 10 mg via ORAL
  Filled 2015-02-13: qty 2

## 2015-02-13 MED ORDER — DIPHENHYDRAMINE HCL 25 MG PO CAPS
25.0000 mg | ORAL_CAPSULE | ORAL | Status: AC
  Administered 2015-02-13: 25 mg via ORAL
  Filled 2015-02-13: qty 1

## 2015-02-13 NOTE — H&P (View-Only) (Signed)
Reason For Visit     Seen today for RLQ pain with N/V.   Active Problems Problems   1. Abdominal pain, RLQ (right lower quadrant) (R10.31)   Assessed By: Jetta Lout (Urology); Last Assessed: 04 Feb 2015  2. Bilateral kidney stones (N20.0)   Assessed By: Jetta Lout (Urology); Last Assessed: 04 Feb 2015  3. Hydronephrosis, bilateral (N13.30)   Assessed By: Jetta Lout (Urology); Last Assessed: 04 Feb 2015  4. Microscopic hematuria (R31.2)   Assessed By: Jetta Lout (Urology); Last Assessed: 04 Feb 2015  5. Nausea and vomiting (R11.2)   Assessed By: Jetta Lout (Urology); Last Assessed: 04 Feb 2015  6. Ureteral calculus, left (N20.1)   Assessed By: Jetta Lout (Urology); Last Assessed: 04 Feb 2015  7. Ureteral calculus, right (N20.1)   Assessed By: Jetta Lout (Urology); Last Assessed: 04 Feb 2015  History of Present Illness           64 YO male patient of Dr. Belva Crome seen today for RLQ pain with n/v.   GU hx: 2015 S/P ESWL right ureteroscopy for retained fragments.  He has a stent that needs to be removed.   Hx of right renal stone.  He had ESWL on 01/17/14 but had a Steinstrasse that required ureteroscopy and stenting on 01/24/14.   He is having pain with the stent.   KUB June 2015 shows the stent in good position.  There is a cluster of stones in the RLQ with a possible 48mm fragment remaining. \  June 2016 Interval Hx:  Developed sxs about 3 days ago after a long distance motorcycle ride. Pain is 4/10 with PRN Naproxen. Denies f/c or hematuria.   Past Medical History Problems   1. History of Arthritis  2. History of Calculus of right ureter (N20.1)  3. History of Calculus of right ureter (N20.1)  4. History of Calculus of ureter (N20.1)  5. History of Hematoma Of The Left Kidney  6. History of acute pyelonephritis (Z87.440)  7. History of kidney stones (Z87.442)  8. History of Hydronephrosis, right (N13.30)  9. History of Lumbar radiculopathy (M54.16)   10. 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 History of Arm Incision  2. History of Back Surgery  3. History of Cystoscopy With Insertion Of Ureteral Stent Left  4. History of Cystoscopy With Insertion Of Ureteral Stent Right  5. History of Cystoscopy With Insertion Of Ureteral Stent Right  6. History of Cystoscopy With Insertion Of Ureteral Stent Right  7. History of Cystoscopy With Ureteroscopy Right  8. History of Cystoscopy With Ureteroscopy With Lithotripsy  9. History of Cystoscopy With Ureteroscopy With Lithotripsy  10. History of Cystoscopy With Ureteroscopy With Lithotripsy  11. History of Cystoscopy With Ureteroscopy With Removal Of Calculus  12. History of Lithotripsy  13. History of Lithotripsy  14. History of Lithotripsy  15. History of Lower Back Surgery  16. History of Neck Surgery  Current Meds  1. HYDROmorphone HCl - 2 MG Oral Tablet; TAKE 1 TABLET EVERY 4 TO 6 HOURS AS  NEEDED FOR PAIN;  Therapy: 14May2015 to (Evaluate:18May2015); Last Rx:14May2015 Ordered  2. Naproxen 500 MG Oral Tablet; TAKE TABLET  PRN;  Therapy: (Recorded:14May2015) to Recorded  3. Omeprazole CPDR;  Therapy: (Recorded:14May2015) to Recorded  4. Promethazine HCl - 25 MG Oral Tablet; TAKE 1 TABLET Every 6 hours PRN;  Therapy: 22Jun2015 to (Last Rx:22Jun2015)  Requested for: 23Jun2015 Ordered  5. Tamsulosin HCl - 0.4 MG Oral Capsule; TAKE  1 CAPSULE Daily;  Therapy: 01Jun2015 to (Evaluate:01Jul2015)  Requested for: 01Jun2015; Last  Rx:01Jun2015 Ordered  Allergies Medication   1. Codeine Derivatives  2. Doxycycline Hyclate CAPS  Family History Problems   1. Family history of Colon Cancer : Mother  2. Family history of Colon Cancer : Maternal Aunt  3. Family history of Colon Cancer : Maternal Aunt  4. Family history of Colon Cancer : Maternal Uncle  5. Family history of Death In The Family Aunt : Maternal Aunt   died age 65-colon cancer  6. Family history  of Death In The Family Aunt : Maternal Aunt   died age -61 colon cancer  7. Family history of Death In The Family Father   died age 79-mini strokes  8. Family history of Death In The Family Mother   died age 66-colon cancer  9. Family history of Family Health Status Number Of Children   1 daughter and 2 sons  10. Family history of Hematuria : Mother  11. Family history of Nephrolithiasis : Mother  12. Family history of Stroke Syndrome : Mother  13. Family history of Stroke Syndrome : Father  Social History Problems   1. Being A Social Drinker  2. Caffeine Use  3. Former Smoker   smoked a pipe for about 3 yrs and quit 4 yrs  4. Marital History - Widowed  5. Occupation:   truck driver/labor  Review of Systems Genitourinary, constitutional, skin, eye, otolaryngeal, hematologic/lymphatic, cardiovascular, pulmonary, endocrine, musculoskeletal, gastrointestinal, neurological and psychiatric system(s) were reviewed and pertinent findings if present are noted and are otherwise negative.  Gastrointestinal: nausea, vomiting and abdominal pain.    Vitals Vital Signs [Data Includes: Last 1 Day]  Recorded: 14Jun2016 01:34PM  Blood Pressure: 166 / 84 Temperature: 98 F Heart Rate: 68  Physical Exam Constitutional: Well nourished and well developed . No acute distress. The patient appears well hydrated.  ENT:. Examination of the teeth show poor dentition.  Neck: The appearance of the neck is normal.  Pulmonary: No respiratory distress.  Cardiovascular: Heart rate and rhythm are normal.  Abdomen: The abdomen is rounded. The abdomen is soft and nontender. No tenderness in the RLQ and no LLQ tenderness. No CVA tenderness.  Skin: Normal skin turgor.  Neuro/Psych:. Mood and affect are appropriate.    Results/Data Urine [Data Includes: Last 1 Day]   14Jun2016  COLOR YELLOW   APPEARANCE CLOUDY   SPECIFIC GRAVITY 1.020   pH 5.5   GLUCOSE NEG mg/dL  BILIRUBIN NEG   KETONE NEG mg/dL   BLOOD LARGE   PROTEIN TRACE mg/dL  UROBILINOGEN 0.2 mg/dL  NITRITE NEG   LEUKOCYTE ESTERASE SMALL   SQUAMOUS EPITHELIAL/HPF RARE   WBC 11-20 WBC/hpf  RBC TNTC RBC/hpf  BACTERIA RARE   CRYSTALS NONE SEEN   CASTS NONE SEEN   Other MUCUS NOTED    The following images/tracing/specimen were independently visualized:  CT urogram: shows bilateral hydronephrosis secondary to large left UPJ calculus. On scout film it appears to be 2 stones on the left; also has right hydronephrosis secondary to distal right 5 mm ureteral calculus at right iliac crest.  The following clinical lab reports were reviewed:  UA: appears infected with significant WBC and RBC noted Will culture BUN/creatine shows AKI with elevated creatine.    Assessment Assessed   1. Microscopic hematuria (R31.2)  2. Abdominal pain, RLQ (right lower quadrant) (R10.31)  3. Nausea and vomiting (R11.2)  4. Bilateral kidney stones (N20.0)  5. Hydronephrosis, bilateral (N13.30)    6. Ureteral calculus, left (N20.1)  7. Ureteral calculus, right (N20.1)  8. Acute renal failure (N17.9)  Plan  Abdominal pain, RLQ (right lower quadrant), Bilateral kidney stones, Nausea and vomiting   1. Administered: Ketorolac Tromethamine 30 MG/ML Injection Solution Bilateral kidney stones   2. VENIPUNCTURE; Status:Complete;   Done: 14Jun2016 Nausea and vomiting   3. AU CT-STONE PROTOCOL; Status:In Progress - Specimen/Data Collected;   Done:  14Jun2016 02:43PM         Culture urine. No ABX unless culture proven UTI Ketorolac 30 mg IM today Stat BUN/creatine Discussed with Dr. Patsi Searsannenbaum. Will proceed with urgent cystourethroscopy, B RPG, and B double J stent placement and admission to follow renal function. Pt and wife were instructed of procedure and risk which include general anesthesia, infection, hematuria, and possible injury to bladder or ureter. All questions addressed and he wishes to proceed.   URINE CULTURE; Status:Hold For - Specimen/Data  Collection,Appointment; Requested for:14Jun2016;  Perform:[facility]; Due:16Jun2016; Marked Important;Ordered; Today;   ZOX:[id number]:Microscopic hematuria; Ordered XB:JYNWGNBy:Warden, Diane;   Discussion/Summary    CC: Dr. Windle GuardWilson Elkins.

## 2015-02-13 NOTE — Discharge Instructions (Signed)

## 2015-02-13 NOTE — Interval H&P Note (Signed)
History and Physical Interval Note:  He was stented and is now to have ESWL on the left ureteral stones.  02/13/2015 8:01 AM  [name]  has presented today for surgery, with the diagnosis of left proximal stone  The various methods of treatment have been discussed with the patient and family. After consideration of risks, benefits and other options for treatment, the patient has consented to  Procedure(s): LEFT EXTRACORPOREAL SHOCK WAVE LITHOTRIPSY (ESWL) (Left) as a surgical intervention .  The patient's history has been reviewed, patient examined, no change in status, stable for surgery.  I have reviewed the patient's chart and labs.  Questions were answered to the patient's satisfaction.     Shankar Silber J

## 2015-02-17 ENCOUNTER — Encounter (HOSPITAL_BASED_OUTPATIENT_CLINIC_OR_DEPARTMENT_OTHER): Payer: Self-pay | Admitting: *Deleted

## 2015-02-17 NOTE — Progress Notes (Addendum)
NPO AFTER MN.  ARRIVE AT 0700.  NEEDS KUB.  CURRENT LAB RESULTS IN CHART AND EPIC. WILL TAKE PRILOSEC AM DOS W/ SIPS OF WATER AND IF NEEDED TAKE PAIN/ NAUSEA RX .

## 2015-02-20 ENCOUNTER — Ambulatory Visit (HOSPITAL_BASED_OUTPATIENT_CLINIC_OR_DEPARTMENT_OTHER): Payer: BLUE CROSS/BLUE SHIELD | Admitting: Anesthesiology

## 2015-02-20 ENCOUNTER — Encounter (HOSPITAL_BASED_OUTPATIENT_CLINIC_OR_DEPARTMENT_OTHER)
Admission: RE | Disposition: A | Discharge status: Home / Self Care | Payer: Self-pay | Source: Ambulatory Visit | Attending: Urology

## 2015-02-20 ENCOUNTER — Ambulatory Visit (HOSPITAL_BASED_OUTPATIENT_CLINIC_OR_DEPARTMENT_OTHER)
Admission: RE | Admit: 2015-02-20 | Discharge: 2015-02-20 | Disposition: A | Discharge status: Home / Self Care | Payer: BLUE CROSS/BLUE SHIELD | Source: Ambulatory Visit | Attending: Urology | Admitting: Urology

## 2015-02-20 ENCOUNTER — Ambulatory Visit (HOSPITAL_COMMUNITY): Payer: BLUE CROSS/BLUE SHIELD

## 2015-02-20 ENCOUNTER — Encounter (HOSPITAL_BASED_OUTPATIENT_CLINIC_OR_DEPARTMENT_OTHER): Payer: Self-pay

## 2015-02-20 DIAGNOSIS — Z87442 Personal history of urinary calculi: Secondary | ICD-10-CM | POA: Diagnosis not present

## 2015-02-20 DIAGNOSIS — M1990 Unspecified osteoarthritis, unspecified site: Secondary | ICD-10-CM | POA: Insufficient documentation

## 2015-02-20 DIAGNOSIS — K219 Gastro-esophageal reflux disease without esophagitis: Secondary | ICD-10-CM | POA: Insufficient documentation

## 2015-02-20 DIAGNOSIS — Z87440 Personal history of urinary (tract) infections: Secondary | ICD-10-CM | POA: Insufficient documentation

## 2015-02-20 DIAGNOSIS — M5416 Radiculopathy, lumbar region: Secondary | ICD-10-CM | POA: Insufficient documentation

## 2015-02-20 DIAGNOSIS — Z87891 Personal history of nicotine dependence: Secondary | ICD-10-CM | POA: Diagnosis not present

## 2015-02-20 DIAGNOSIS — N200 Calculus of kidney: Secondary | ICD-10-CM | POA: Diagnosis present

## 2015-02-20 DIAGNOSIS — N132 Hydronephrosis with renal and ureteral calculous obstruction: Secondary | ICD-10-CM | POA: Insufficient documentation

## 2015-02-20 HISTORY — DX: Other intervertebral disc degeneration, lumbosacral region without mention of lumbar back pain or lower extremity pain: M51.379

## 2015-02-20 HISTORY — DX: Personal history of urinary calculi: Z87.442

## 2015-02-20 HISTORY — PX: HOLMIUM LASER APPLICATION: SHX5852

## 2015-02-20 HISTORY — DX: Dorsalgia, unspecified: M54.9

## 2015-02-20 HISTORY — DX: Other chronic pain: G89.29

## 2015-02-20 HISTORY — DX: Calculus of kidney: N20.0

## 2015-02-20 HISTORY — DX: Disorder of kidney and ureter, unspecified: N28.9

## 2015-02-20 HISTORY — PX: CYSTOSCOPY WITH URETEROSCOPY, STONE BASKETRY AND STENT PLACEMENT: SHX6378

## 2015-02-20 HISTORY — DX: Other intervertebral disc degeneration, lumbosacral region: M51.37

## 2015-02-20 HISTORY — DX: Anesthesia of skin: R20.0

## 2015-02-20 HISTORY — DX: Calculus of ureter: N20.1

## 2015-02-20 SURGERY — CYSTOSCOPY, WITH CALCULUS MANIPULATION OR REMOVAL
Anesthesia: General

## 2015-02-20 MED ORDER — FENTANYL CITRATE (PF) 100 MCG/2ML IJ SOLN
INTRAMUSCULAR | Status: DC | PRN
  Administered 2015-02-20 (×2): 12.5 ug via INTRAVENOUS
  Administered 2015-02-20: 50 ug via INTRAVENOUS
  Administered 2015-02-20: 25 ug via INTRAVENOUS
  Administered 2015-02-20 (×4): 12.5 ug via INTRAVENOUS

## 2015-02-20 MED ORDER — KETOROLAC TROMETHAMINE 30 MG/ML IJ SOLN
INTRAMUSCULAR | Status: DC | PRN
  Administered 2015-02-20: 30 mg via INTRAVENOUS

## 2015-02-20 MED ORDER — LACTATED RINGERS IV SOLN
INTRAVENOUS | Status: DC
  Filled 2015-02-20: qty 1000

## 2015-02-20 MED ORDER — FENTANYL CITRATE (PF) 100 MCG/2ML IJ SOLN
INTRAMUSCULAR | Status: AC
  Filled 2015-02-20: qty 6

## 2015-02-20 MED ORDER — CIPROFLOXACIN IN D5W 400 MG/200ML IV SOLN
400.0000 mg | INTRAVENOUS | Status: AC
  Administered 2015-02-20: 400 mg via INTRAVENOUS
  Filled 2015-02-20: qty 200

## 2015-02-20 MED ORDER — ONDANSETRON HCL 4 MG/2ML IJ SOLN
4.0000 mg | Freq: Once | INTRAMUSCULAR | Status: DC | PRN
  Filled 2015-02-20: qty 2

## 2015-02-20 MED ORDER — SODIUM CHLORIDE 0.9 % IV SOLN
INTRAVENOUS | Status: DC
  Administered 2015-02-20: 08:00:00 via INTRAVENOUS
  Filled 2015-02-20: qty 1000

## 2015-02-20 MED ORDER — HYDROMORPHONE HCL 1 MG/ML IJ SOLN
0.2500 mg | INTRAMUSCULAR | Status: DC | PRN
  Filled 2015-02-20: qty 1

## 2015-02-20 MED ORDER — IOHEXOL 350 MG/ML SOLN
INTRAVENOUS | Status: DC | PRN
  Administered 2015-02-20: 50 mL via URETHRAL

## 2015-02-20 MED ORDER — MIDAZOLAM HCL 2 MG/2ML IJ SOLN
INTRAMUSCULAR | Status: AC
  Filled 2015-02-20: qty 2

## 2015-02-20 MED ORDER — ONDANSETRON HCL 4 MG/2ML IJ SOLN
INTRAMUSCULAR | Status: DC | PRN
  Administered 2015-02-20: 4 mg via INTRAVENOUS

## 2015-02-20 MED ORDER — LIDOCAINE HCL (CARDIAC) 20 MG/ML IV SOLN
INTRAVENOUS | Status: DC | PRN
  Administered 2015-02-20: 100 mg via INTRAVENOUS

## 2015-02-20 MED ORDER — MEPERIDINE HCL 25 MG/ML IJ SOLN
6.2500 mg | INTRAMUSCULAR | Status: DC | PRN
  Filled 2015-02-20: qty 1

## 2015-02-20 MED ORDER — CIPROFLOXACIN IN D5W 400 MG/200ML IV SOLN
INTRAVENOUS | Status: AC
  Filled 2015-02-20: qty 200

## 2015-02-20 MED ORDER — ACETAMINOPHEN 10 MG/ML IV SOLN
INTRAVENOUS | Status: DC | PRN
  Administered 2015-02-20: 1000 mg via INTRAVENOUS

## 2015-02-20 MED ORDER — DEXAMETHASONE SODIUM PHOSPHATE 10 MG/ML IJ SOLN
INTRAMUSCULAR | Status: DC | PRN
  Administered 2015-02-20: 10 mg via INTRAVENOUS

## 2015-02-20 MED ORDER — SODIUM CHLORIDE 0.9 % IR SOLN
Status: DC | PRN
  Administered 2015-02-20: 3000 mL via INTRAVESICAL

## 2015-02-20 MED ORDER — PROPOFOL 10 MG/ML IV BOLUS
INTRAVENOUS | Status: DC | PRN
  Administered 2015-02-20: 200 mg via INTRAVENOUS

## 2015-02-20 MED ORDER — PROMETHAZINE HCL 25 MG PO TABS: 25.0000 mg | ORAL_TABLET | Freq: Four times a day (QID) | ORAL | Status: DC | PRN

## 2015-02-20 MED ORDER — HYDROMORPHONE HCL 2 MG PO TABS: 2.0000 mg | ORAL_TABLET | ORAL | Status: DC | PRN

## 2015-02-20 MED ORDER — LACTATED RINGERS IV SOLN
INTRAVENOUS | Status: DC | PRN
  Administered 2015-02-20 (×2): via INTRAVENOUS

## 2015-02-20 MED ORDER — MIDAZOLAM HCL 5 MG/5ML IJ SOLN
INTRAMUSCULAR | Status: DC | PRN
  Administered 2015-02-20: 2 mg via INTRAVENOUS

## 2015-02-20 SURGICAL SUPPLY — 37 items
BAG DRAIN URO-CYSTO SKYTR STRL (DRAIN) ×4 IMPLANT
BASKET LASER NITINOL 1.9FR (BASKET) IMPLANT
BASKET STNLS GEMINI 4WIRE 3FR (BASKET) IMPLANT
BASKET STONE NITINOL 3FRX115MB (UROLOGICAL SUPPLIES) ×12 IMPLANT
BASKET ZERO TIP NITINOL 2.4FR (BASKET) IMPLANT
CANISTER SUCT LVC 12 LTR MEDI- (MISCELLANEOUS) IMPLANT
CATH URET 5FR 28IN CONE TIP (BALLOONS)
CATH URET 5FR 28IN OPEN ENDED (CATHETERS) ×4 IMPLANT
CATH URET 5FR 70CM CONE TIP (BALLOONS) IMPLANT
CLOTH BEACON ORANGE TIMEOUT ST (SAFETY) ×4 IMPLANT
ELECT REM PT RETURN 9FT ADLT (ELECTROSURGICAL)
ELECTRODE REM PT RTRN 9FT ADLT (ELECTROSURGICAL) IMPLANT
FIBER LASER FLEXIVA 1000 (UROLOGICAL SUPPLIES) IMPLANT
FIBER LASER FLEXIVA 365 (UROLOGICAL SUPPLIES) IMPLANT
FIBER LASER FLEXIVA 550 (UROLOGICAL SUPPLIES) IMPLANT
FIBER LASER TRAC TIP (UROLOGICAL SUPPLIES) ×4 IMPLANT
GLOVE BIOGEL PI IND STRL 6.5 (GLOVE) ×2 IMPLANT
GLOVE BIOGEL PI INDICATOR 6.5 (GLOVE) ×2
GLOVE ECLIPSE 6.5 STRL STRAW (GLOVE) ×12 IMPLANT
GLOVE SURG SS PI 8.0 STRL IVOR (GLOVE) ×4 IMPLANT
GOWN STRL REUS W/ TWL LRG LVL3 (GOWN DISPOSABLE) ×2 IMPLANT
GOWN STRL REUS W/ TWL XL LVL3 (GOWN DISPOSABLE) ×2 IMPLANT
GOWN STRL REUS W/TWL LRG LVL3 (GOWN DISPOSABLE) ×2
GOWN STRL REUS W/TWL XL LVL3 (GOWN DISPOSABLE) ×2
GUIDEWIRE 0.038 PTFE COATED (WIRE) IMPLANT
GUIDEWIRE ANG ZIPWIRE 038X150 (WIRE) IMPLANT
GUIDEWIRE STR DUAL SENSOR (WIRE) ×8 IMPLANT
IV NS IRRIG 3000ML ARTHROMATIC (IV SOLUTION) ×4 IMPLANT
KIT BALLIN UROMAX 15FX10 (LABEL) IMPLANT
KIT BALLN UROMAX 15FX4 (MISCELLANEOUS) IMPLANT
KIT BALLN UROMAX 26 75X4 (MISCELLANEOUS)
MANIFOLD NEPTUNE II (INSTRUMENTS) ×4 IMPLANT
PACK CYSTO (CUSTOM PROCEDURE TRAY) ×4 IMPLANT
SET HIGH PRES BAL DIL (LABEL)
SHEATH ACCESS URETERAL 38CM (SHEATH) IMPLANT
SHEATH ACCESS URETERAL 54CM (SHEATH) ×4 IMPLANT
STENT URET 6FRX24 CONTOUR (STENTS) ×4 IMPLANT

## 2015-02-20 NOTE — Anesthesia Procedure Notes (Signed)
Procedure Name: LMA Insertion Date/Time: 02/20/2015 8:43 AM Performed by: Jessica PriestBEESON, Anthony Gaughran C Pre-anesthesia Checklist: Patient identified, Emergency Drugs available, Suction available and Patient being monitored Patient Re-evaluated:Patient Re-evaluated prior to inductionOxygen Delivery Method: Circle System Utilized Preoxygenation: Pre-oxygenation with 100% oxygen Intubation Type: IV induction Ventilation: Mask ventilation without difficulty LMA: LMA inserted LMA Size: 5.0 Number of attempts: 1 Airway Equipment and Method: Bite block Placement Confirmation: positive ETCO2 Tube secured with: Tape Dental Injury: Teeth and Oropharynx as per pre-operative assessment

## 2015-02-20 NOTE — Transfer of Care (Signed)
Immediate Anesthesia Transfer of Care Note  Patient: Anthony [name]  Procedure(s) Performed: Procedure(s) (LRB): CYSTOSCOPY WITH BILATERAL URETEROSCOPY, STONE EXTRACTION WITH LASER  AND STENT PLACEMENT (Bilateral) HOLMIUM LASER APPLICATION (N/A)  Patient Location: PACU  Anesthesia Type: General  Level of Consciousness: awake, sedated, patient cooperative and responds to stimulation  Airway & Oxygen Therapy: Patient Spontanous Breathing and Patient connected to face mask oxygen  Post-op Assessment: Report given to PACU RN, Post -op Vital signs reviewed and stable and Patient moving all extremities  Post vital signs: Reviewed and stable  Complications: No apparent anesthesia complications

## 2015-02-20 NOTE — Anesthesia Preprocedure Evaluation (Addendum)
Anesthesia Evaluation  Patient identified by MRN, date of birth, ID band Patient awake    Reviewed: Allergy & Precautions, NPO status , Patient's Chart, lab work & pertinent test results  Airway Mallampati: I  TM Distance: >3 FB Neck ROM: Full    Dental   Pulmonary former smoker,    Pulmonary exam normal       Cardiovascular Normal cardiovascular exam    Neuro/Psych    GI/Hepatic GERD-  Medicated and Controlled,  Endo/Other    Renal/GU Renal InsufficiencyRenal disease     Musculoskeletal   Abdominal   Peds  Hematology   Anesthesia Other Findings   Reproductive/Obstetrics                            Anesthesia Physical Anesthesia Plan  ASA: II  Anesthesia Plan: General   Post-op Pain Management:    Induction: Intravenous  Airway Management Planned: LMA  Additional Equipment:   Intra-op Plan:   Post-operative Plan: Extubation in OR  Informed Consent: I have reviewed the patients History and Physical, chart, labs and discussed the procedure including the risks, benefits and alternatives for the proposed anesthesia with the patient or authorized representative who has indicated his/her understanding and acceptance.     Plan Discussed with: CRNA and Surgeon  Anesthesia Plan Comments:         Anesthesia Quick Evaluation

## 2015-02-20 NOTE — Discharge Instructions (Addendum)

## 2015-02-20 NOTE — Brief Op Note (Signed)
02/20/2015  10:11 AM  PATIENT:  Shela LeffWilliam R Yamin  64 y.o. male  PRE-OPERATIVE DIAGNOSIS:  bilateral ureteral stones and renal stones  POST-OPERATIVE DIAGNOSIS:  bilateral ureteral stone and renal stones  PROCEDURE:  Procedure(s): CYSTOSCOPY WITH BILATERAL URETEROSCOPY, STONE EXTRACTION WITH LASER  AND STENT PLACEMENT (Bilateral) HOLMIUM LASER APPLICATION (N/A)  SURGEON:  Surgeon(s) and Role:    * Bjorn PippinJohn Christiann Hagerty, MD - Primary  PHYSICIAN ASSISTANT:   ASSISTANTS: none   ANESTHESIA:   general  EBL:  Total I/O In: 1000 [I.V.:1000] Out: -   BLOOD ADMINISTERED:none  DRAINS: left 6 x 24 JJ stent   LOCAL MEDICATIONS USED:  NONE  SPECIMEN:  Source of Specimen:  stone fragments  DISPOSITION OF SPECIMEN:  to patient  COUNTS:  YES  TOURNIQUET:  * No tourniquets in log *  DICTATION: .Other Dictation: Dictation Number [id number]  PLAN OF CARE: Discharge to home after PACU  PATIENT DISPOSITION:  PACU - hemodynamically stable.   Delay start of Pharmacological VTE agent (>24hrs) due to surgical blood loss or risk of bleeding: not applicable

## 2015-02-20 NOTE — H&P (View-Only) (Signed)
Reason For Visit     Seen today for RLQ pain with N/V.   Active Problems Problems   1. Abdominal pain, RLQ (right lower quadrant) (R10.31)   Assessed By: Jetta Lout (Urology); Last Assessed: 04 Feb 2015  2. Bilateral kidney stones (N20.0)   Assessed By: Jetta Lout (Urology); Last Assessed: 04 Feb 2015  3. Hydronephrosis, bilateral (N13.30)   Assessed By: Jetta Lout (Urology); Last Assessed: 04 Feb 2015  4. Microscopic hematuria (R31.2)   Assessed By: Jetta Lout (Urology); Last Assessed: 04 Feb 2015  5. Nausea and vomiting (R11.2)   Assessed By: Jetta Lout (Urology); Last Assessed: 04 Feb 2015  6. Ureteral calculus, left (N20.1)   Assessed By: Jetta Lout (Urology); Last Assessed: 04 Feb 2015  7. Ureteral calculus, right (N20.1)   Assessed By: Jetta Lout (Urology); Last Assessed: 04 Feb 2015  History of Present Illness           64 YO male Anthony [name] [name] of Dr. Belva Crome seen today for RLQ pain with n/v.   GU hx: 2015 S/P ESWL right ureteroscopy for retained fragments.  He has a stent that needs to be removed.   Hx of right renal stone.  He had ESWL on 01/17/14 but had a Steinstrasse that required ureteroscopy and stenting on 01/24/14.   He is having pain with the stent.   KUB June 2015 shows the stent in good position.  There is a cluster of stones in the RLQ with a possible 48mm fragment remaining. \  June 2016 Interval Hx:  Developed sxs about 3 days ago after a long distance motorcycle ride. Pain is 4/10 with PRN Naproxen. Denies f/c or hematuria.   Past Medical History Problems   1. History of Arthritis  2. History of Calculus of right ureter (N20.1)  3. History of Calculus of right ureter (N20.1)  4. History of Calculus of ureter (N20.1)  5. History of Hematoma Of The Left Kidney  6. History of acute pyelonephritis (Z87.440)  7. History of kidney stones (Z87.442)  8. History of Hydronephrosis, right (N13.30)  9. History of Lumbar radiculopathy (M54.16)   10. 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 History of Arm Incision  2. History of Back Surgery  3. History of Cystoscopy With Insertion Of Ureteral Stent Left  4. History of Cystoscopy With Insertion Of Ureteral Stent Right  5. History of Cystoscopy With Insertion Of Ureteral Stent Right  6. History of Cystoscopy With Insertion Of Ureteral Stent Right  7. History of Cystoscopy With Ureteroscopy Right  8. History of Cystoscopy With Ureteroscopy With Lithotripsy  9. History of Cystoscopy With Ureteroscopy With Lithotripsy  10. History of Cystoscopy With Ureteroscopy With Lithotripsy  11. History of Cystoscopy With Ureteroscopy With Removal Of Calculus  12. History of Lithotripsy  Anthony [name]. History of Lithotripsy  14. History of Lithotripsy  15. History of Lower Back Surgery  16. History of Neck Surgery  Current Meds  1. HYDROmorphone HCl - 2 MG Oral Tablet; TAKE 1 TABLET EVERY 4 TO 6 HOURS AS  NEEDED FOR PAIN;  Therapy: 14May2015 to (Evaluate:18May2015); Last Rx:14May2015 Ordered  2. Naproxen 500 MG Oral Tablet; TAKE TABLET  PRN;  Therapy: (Recorded:14May2015) to Recorded  3. Omeprazole CPDR;  Therapy: (Recorded:14May2015) to Recorded  4. Promethazine HCl - 25 MG Oral Tablet; TAKE 1 TABLET Every 6 hours PRN;  Therapy: 22Jun2015 to (Last Rx:22Jun2015)  Requested for: 23Jun2015 Ordered  5. Tamsulosin HCl - 0.4 MG Oral Capsule; TAKE  1 CAPSULE Daily;  Therapy: 01Jun2015 to (Evaluate:01Jul2015)  Requested for: 01Jun2015; Last  Rx:01Jun2015 Ordered  Allergies Medication   1. Codeine Derivatives  2. Doxycycline Hyclate CAPS  Family History Problems   1. Family history of Colon Cancer : Mother  2. Family history of Colon Cancer : Maternal Aunt  3. Family history of Colon Cancer : Maternal Aunt  4. Family history of Colon Cancer : Maternal Uncle  5. Family history of Death In The Family Aunt : Maternal Aunt   died age 65-colon cancer  6. Family history  of Death In The Family Aunt : Maternal Aunt   died age -72 colon cancer  7. Family history of Death In The Family Father   died age 40-mini strokes  8. Family history of Death In The Family Mother   died age 20-colon cancer  28. Family history of Family Health Status Number Of Children   1 daughter and 2 sons  7. Family history of Hematuria : Mother  72. Family history of Nephrolithiasis : Mother  47. Family history of Stroke Syndrome : Mother  31. Family history of Stroke Syndrome : Father  Social History Problems   1. Being A Social Drinker  2. Caffeine Use  3. Former Smoker   smoked a pipe for about 3 yrs and quit 4 yrs  4. Marital History - Widowed  5. Occupation:   truck Fish farm manager  Review of Systems Genitourinary, constitutional, skin, eye, otolaryngeal, hematologic/lymphatic, cardiovascular, pulmonary, endocrine, musculoskeletal, gastrointestinal, neurological and psychiatric system(s) were reviewed and pertinent findings if present are noted and are otherwise negative.  Gastrointestinal: nausea, vomiting and abdominal pain.    Vitals Vital Signs [Data Includes: Last 1 Day]  Recorded: 14Jun2016 01:34PM  Blood Pressure: 166 / 84 Temperature: 98 F Heart Rate: 68  Physical Exam Constitutional: Well nourished and well developed . No acute distress. The Anthony [name] [name] appears well hydrated.  ENT:. Examination of the teeth show poor dentition.  Neck: The appearance of the neck is normal.  Pulmonary: No respiratory distress.  Cardiovascular: Heart rate and rhythm are normal.  Abdomen: The abdomen is rounded. The abdomen is soft and nontender. No tenderness in the RLQ and no LLQ tenderness. No CVA tenderness.  Skin: Normal skin turgor.  Neuro/Psych:. Mood and affect are appropriate.    Results/Data Urine [Data Includes: Last 1 Day]   14Jun2016  COLOR YELLOW   APPEARANCE CLOUDY   SPECIFIC GRAVITY 1.020   pH 5.5   GLUCOSE NEG mg/dL  BILIRUBIN NEG   KETONE NEG mg/dL   BLOOD LARGE   PROTEIN TRACE mg/dL  UROBILINOGEN 0.2 mg/dL  NITRITE NEG   LEUKOCYTE ESTERASE SMALL   SQUAMOUS EPITHELIAL/HPF RARE   WBC 11-20 WBC/hpf  RBC TNTC RBC/hpf  BACTERIA RARE   CRYSTALS NONE SEEN   CASTS NONE SEEN   Other MUCUS NOTED    The following images/tracing/specimen were independently visualized:  CT urogram: shows bilateral hydronephrosis secondary to large left UPJ calculus. On scout film it appears to be 2 stones on the left; also has right hydronephrosis secondary to distal right 5 mm ureteral calculus at right iliac crest.  The following clinical lab reports were reviewed:  UA: appears infected with significant WBC and RBC noted Will culture BUN/creatine shows AKI with elevated creatine.    Assessment Assessed   1. Microscopic hematuria (R31.2)  2. Abdominal pain, RLQ (right lower quadrant) (R10.31)  3. Nausea and vomiting (R11.2)  4. Bilateral kidney stones (N20.0)  5. Hydronephrosis, bilateral (N13.30)  6. Ureteral calculus, left (N20.1)  7. Ureteral calculus, right (N20.1)  8. Acute renal failure (N17.9)  Plan  Abdominal pain, RLQ (right lower quadrant), Bilateral kidney stones, Nausea and vomiting   1. Administered: Ketorolac Tromethamine 30 MG/ML Injection Solution Bilateral kidney stones   2. VENIPUNCTURE; Status:Complete;   Done: 14Jun2016 Nausea and vomiting   3. AU CT-STONE PROTOCOL; Status:In Progress - Specimen/Data Collected;   Done:  14Jun2016 02:43PM         Culture urine. No ABX unless culture proven UTI Ketorolac 30 mg IM today Stat BUN/creatine Discussed with Dr. Patsi Searsannenbaum. Will proceed with urgent cystourethroscopy, B RPG, and B double J stent placement and admission to follow renal function. Pt and wife were instructed of procedure and risk which include general anesthesia, infection, hematuria, and possible injury to bladder or ureter. All questions addressed and he wishes to proceed.   URINE CULTURE; Status:Hold For - Specimen/Data  Collection,Appointment; Requested for:14Jun2016;  Perform:[facility]; Due:16Jun2016; Marked Important;Ordered; Today;   ZOX:[id number]:Microscopic hematuria; Ordered XB:JYNWGNBy:Warden, Diane;   Discussion/Summary    CC: Dr. Windle GuardWilson Elkins.

## 2015-02-20 NOTE — Interval H&P Note (Signed)
History and Physical Interval Note:  He had left ESWL for 2 proximal stones and has had good fragmentation.  The right proximal stone remains adjacent to the stent.   He will have bilateral ureteroscopy today.   02/20/2015 8:20 AM  Anthony LeffWilliam R [name]  has presented today for surgery, with the diagnosis of bilateral ureteral stone  The various methods of treatment have been discussed with the patient and family. After consideration of risks, benefits and other options for treatment, the patient has consented to  Procedure(s): CYSTOSCOPY WITH BILATERAL URETEROSCOPY, STONE EXTRACTION WITH LASER  AND STENT PLACEMENT (Bilateral) HOLMIUM LASER APPLICATION (N/A) as a surgical intervention .  The patient's history has been reviewed, patient examined, no change in status, stable for surgery.  I have reviewed the patient's chart and labs.  Questions were answered to the patient's satisfaction.     Anthony [name]

## 2015-02-21 ENCOUNTER — Encounter (HOSPITAL_BASED_OUTPATIENT_CLINIC_OR_DEPARTMENT_OTHER): Payer: Self-pay | Admitting: Urology

## 2015-02-21 NOTE — Op Note (Signed)
NAME:  Anderson MaltaWELKER, Charod              ACCOUNT NO.:  [id number]  MEDICAL RECORD NO.:  [id number]  LOCATION:                                 FACILITY:  PHYSICIAN:  Excell SeltzerJohn J. Annabell HowellsWrenn, M.D.    DATE OF BIRTH:  02/04/1951  DATE OF PROCEDURE:  02/20/2015 DATE OF DISCHARGE:  02/20/2015                              OPERATIVE REPORT   PROCEDURES: 1. Cystoscopy with right ureteral stent removal. 2. Right ureteroscopic stone extraction. 3. Left ureteral stent removal. 4. Left ureteroscopy with laser and stenting.  PREOPERATIVE DIAGNOSIS:  Bilateral ureteral and left renal stones.  POSTOPERATIVE DIAGNOSIS:  Bilateral ureteral and left renal stones.  SURGEON:  Excell SeltzerJohn J. Annabell HowellsWrenn, M.D.  ANESTHESIA:  General.  SPECIMEN:  Stone fragments.  DRAINS:  Left 6-French x 24-cm double-J stent.  BLOOD LOSS:  None.  COMPLICATIONS:  None.  INDICATIONS:  Mr. Sharene SkeansWelker is a 64 year old white male with history of recurrent urolithiasis, who recently presented with bilateral ureteral stones including a 5-mm right proximal small stones and two larger left proximal stones.  He had previously undergone bilateral stenting and left extracorporeal shockwave lithotripsy and returns now for right ureteroscopy and left ureteroscopy.  FINDINGS OF PROCEDURE:  He was given Cipro.  He was taken to the operating room where general anesthetic was induced.  He was placed in lithotomy position and was fitted with PAS hose.  His perineum and genitalia were prepped with Betadine solution.  He was draped in usual sterile fashion.  Cystoscopy was performed using the 23-French scope with the 30-degree lens.  Examination revealed the normal urethra.  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  Examination of the bladder revealed mild trabeculation.  There were bilateral ureteral stents with some ureteral edema, but no tumors were noted, some stone fragments were noted in the bladder.  The right  ureteral stent was grasped with a grasping forceps and pulled to the urethral meatus were a guidewire was passed to the kidney through the stent, the stent was then removed.  A 55-cm 12 x 14 access sheath was then inserted over the wire and easily passed to the proximal ureter.  Actually prior to passage of the access sheath, the 6.5-French semi-rigid ureteroscope was advanced to the proximal ureter, but I could not see the stone, it was felt that it pushed back into the kidney.  The access sheath was then inserted over the wire.  The inner core and wire were removed and dual-lumen flexible digital ureteroscope was then advanced to the kidney.  The kidney was thoroughly inspected, a 5-mm stone was identified, grasped with an NGage basket and removed intact. The stone was removed along with the sheath with inspection of the ureteral mucosa during removal, it was felt that once the stent sheaths were removed, then a new stent was not required.  At this point, the cystoscope was reinserted and the left ureteral stent was pulled the urethral meatus.  The wire was passed to the kidney and the stent was removed.  The 12 x 14 55-cm access sheath was inserted over the wire to the proximal ureter and the dual-lumen digital scope was then advanced. There were some stone  fragments in the proximal ureter, then it flushed back into the kidney.  Inspection of the kidney revealed multiple fragments in the mid and lower pole calices.  The larger fragments were removed with an NGage basket.  An additional stone in the lower pole calyx that had not been treated with lithotripsy, was grasped with the NGage basket and removed.  It was adherent to the papillary tip, became loose without difficulty.  Some minor lasering with a 200-micron laser fiber set on 0.5 watts was required to reduce the size sufficient to remove it through the sheath.  Additionally, I had applied the laser to some of the larger  fragments in the midpole as well.  Initially, I started at 0.5 watts and 50 hertz, but increased the power to 0.8 and reduced the frequency to 20.  Once the previously treated stone fragments had been lasered sufficiently and all significant fragments removed, I turned my attention to residual upper pole stone that was approximately 12 mm x 8 mm.  This stone was then engaged with the laser and broken into manageable fragments, which were then removed with the NGage basket.  Several of the fragments were adherent to the papillary tip, but came away with the laser and the basket.  Final inspection revealed only sub 3-mm fragments and dust, and at this point, no further large fragments were identified on fluoroscopy as well.  The ureteroscope was removed.  A guidewire was paced and placed through the access sheath.  The access sheath was removed.  The cystoscope was reinserted over the wire and a 6-French 24-cm double-J stent without string was inserted to the kidney under fluoroscopic guidance.  The wire was removed, leaving a good coil in the kidney and good coil in the bladder.  The bladder was then drained.  The cystoscope was removed.  The patient was taken down from lithotomy position.  His anesthetic was reversed.  He was moved to the recovery room in stable condition.  There were no complications.     Excell Seltzer. Annabell Howells, M.D.     JJW/MEDQ  D:  02/20/2015  T:  02/20/2015  Job:  [id number]

## 2015-02-21 NOTE — Anesthesia Postprocedure Evaluation (Signed)
Anesthesia Post Note  Patient: Anthony LeffWilliam R [name]  Procedure(s) Performed: Procedure(s) (LRB): CYSTOSCOPY WITH BILATERAL URETEROSCOPY, STONE EXTRACTION WITH LASER  AND STENT PLACEMENT (Bilateral) HOLMIUM LASER APPLICATION (N/A)  Anesthesia type: general  Patient location: PACU  Post pain: Pain level controlled  Post assessment: Patient's Cardiovascular Status Stable  Last Vitals:  Filed Vitals:   02/20/15 1132  BP: 132/89  Pulse: 64  Temp: 36.7 C  Resp: 16    Post vital signs: Reviewed and stable  Level of consciousness: sedated  Complications: No apparent anesthesia complications

## 2015-04-18 ENCOUNTER — Observation Stay
Admission: EM | Admit: 2015-04-18 | Discharge: 2015-04-22 | Disposition: A | Discharge status: Home / Self Care | Payer: BLUE CROSS/BLUE SHIELD | Attending: Surgery | Admitting: Surgery

## 2015-04-18 ENCOUNTER — Emergency Department: Payer: BLUE CROSS/BLUE SHIELD

## 2015-04-18 ENCOUNTER — Encounter: Payer: Self-pay | Admitting: *Deleted

## 2015-04-18 DIAGNOSIS — R0602 Shortness of breath: Secondary | ICD-10-CM | POA: Insufficient documentation

## 2015-04-18 DIAGNOSIS — N201 Calculus of ureter: Secondary | ICD-10-CM | POA: Insufficient documentation

## 2015-04-18 DIAGNOSIS — R1011 Right upper quadrant pain: Secondary | ICD-10-CM | POA: Diagnosis present

## 2015-04-18 DIAGNOSIS — M1990 Unspecified osteoarthritis, unspecified site: Secondary | ICD-10-CM | POA: Insufficient documentation

## 2015-04-18 DIAGNOSIS — N029 Recurrent and persistent hematuria with unspecified morphologic changes: Secondary | ICD-10-CM | POA: Insufficient documentation

## 2015-04-18 DIAGNOSIS — K219 Gastro-esophageal reflux disease without esophagitis: Secondary | ICD-10-CM | POA: Diagnosis not present

## 2015-04-18 DIAGNOSIS — K449 Diaphragmatic hernia without obstruction or gangrene: Secondary | ICD-10-CM | POA: Diagnosis not present

## 2015-04-18 DIAGNOSIS — R079 Chest pain, unspecified: Secondary | ICD-10-CM | POA: Diagnosis not present

## 2015-04-18 DIAGNOSIS — R112 Nausea with vomiting, unspecified: Secondary | ICD-10-CM | POA: Insufficient documentation

## 2015-04-18 DIAGNOSIS — I341 Nonrheumatic mitral (valve) prolapse: Secondary | ICD-10-CM | POA: Diagnosis not present

## 2015-04-18 DIAGNOSIS — K8012 Calculus of gallbladder with acute and chronic cholecystitis without obstruction: Secondary | ICD-10-CM | POA: Diagnosis not present

## 2015-04-18 DIAGNOSIS — Z809 Family history of malignant neoplasm, unspecified: Secondary | ICD-10-CM | POA: Diagnosis not present

## 2015-04-18 DIAGNOSIS — J869 Pyothorax without fistula: Secondary | ICD-10-CM | POA: Diagnosis not present

## 2015-04-18 DIAGNOSIS — G8929 Other chronic pain: Secondary | ICD-10-CM | POA: Diagnosis not present

## 2015-04-18 DIAGNOSIS — Z8249 Family history of ischemic heart disease and other diseases of the circulatory system: Secondary | ICD-10-CM | POA: Insufficient documentation

## 2015-04-18 DIAGNOSIS — K810 Acute cholecystitis: Secondary | ICD-10-CM | POA: Diagnosis present

## 2015-04-18 DIAGNOSIS — N289 Disorder of kidney and ureter, unspecified: Secondary | ICD-10-CM | POA: Diagnosis not present

## 2015-04-18 DIAGNOSIS — R1013 Epigastric pain: Secondary | ICD-10-CM | POA: Diagnosis not present

## 2015-04-18 DIAGNOSIS — Z87442 Personal history of urinary calculi: Secondary | ICD-10-CM | POA: Diagnosis not present

## 2015-04-18 DIAGNOSIS — Z823 Family history of stroke: Secondary | ICD-10-CM | POA: Diagnosis not present

## 2015-04-18 DIAGNOSIS — Z833 Family history of diabetes mellitus: Secondary | ICD-10-CM | POA: Insufficient documentation

## 2015-04-18 DIAGNOSIS — M549 Dorsalgia, unspecified: Secondary | ICD-10-CM | POA: Insufficient documentation

## 2015-04-18 DIAGNOSIS — Z8349 Family history of other endocrine, nutritional and metabolic diseases: Secondary | ICD-10-CM | POA: Diagnosis not present

## 2015-04-18 DIAGNOSIS — Z87891 Personal history of nicotine dependence: Secondary | ICD-10-CM | POA: Insufficient documentation

## 2015-04-18 DIAGNOSIS — K819 Cholecystitis, unspecified: Secondary | ICD-10-CM | POA: Diagnosis present

## 2015-04-18 LAB — BASIC METABOLIC PANEL
Anion gap: 8 (ref 5–15)
BUN: 18 mg/dL (ref 6–20)
CO2: 24 mmol/L (ref 22–32)
Calcium: 9.1 mg/dL (ref 8.9–10.3)
Chloride: 108 mmol/L (ref 101–111)
Creatinine, Ser: 1.92 mg/dL — ABNORMAL HIGH (ref 0.61–1.24)
GFR calc Af Amer: 41 mL/min — ABNORMAL LOW (ref >60)
GFR calc non Af Amer: 36 mL/min — ABNORMAL LOW (ref >60)
Glucose, Bld: 98 mg/dL (ref 65–99)
Potassium: 4 mmol/L (ref 3.5–5.1)
Sodium: 140 mmol/L (ref 135–145)

## 2015-04-18 LAB — CBC
HCT: 44.7 % (ref 40.0–52.0)
Hemoglobin: 14.7 g/dL (ref 13.0–18.0)
MCH: 28.4 pg (ref 26.0–34.0)
MCHC: 33 g/dL (ref 32.0–36.0)
MCV: 86 fL (ref 80.0–100.0)
Platelets: 186 10*3/uL (ref 150–440)
RBC: 5.2 MIL/uL (ref 4.40–5.90)
RDW: 14.4 % (ref 11.5–14.5)
WBC: 9.9 10*3/uL (ref 3.8–10.6)

## 2015-04-18 LAB — HEPATIC FUNCTION PANEL
ALT: 27 U/L (ref 17–63)
AST: 43 U/L — ABNORMAL HIGH (ref 15–41)
Albumin: 3.9 g/dL (ref 3.5–5.0)
Alkaline Phosphatase: 104 U/L (ref 38–126)
Bilirubin, Direct: 0.2 mg/dL (ref 0.1–0.5)
Indirect Bilirubin: 0.6 mg/dL (ref 0.3–0.9)
Total Bilirubin: 0.8 mg/dL (ref 0.3–1.2)
Total Protein: 6.9 g/dL (ref 6.5–8.1)

## 2015-04-18 LAB — TROPONIN I: Troponin I: <0.03 ng/mL (ref <0.031)

## 2015-04-18 LAB — LIPASE, BLOOD: Lipase: 29 U/L (ref 22–51)

## 2015-04-18 MED ORDER — HYDROMORPHONE HCL 1 MG/ML IJ SOLN
1.0000 mg | Freq: Once | INTRAMUSCULAR | Status: AC
  Administered 2015-04-18: 1 mg via INTRAVENOUS
  Filled 2015-04-18: qty 1

## 2015-04-18 MED ORDER — ONDANSETRON HCL 4 MG/2ML IJ SOLN
4.0000 mg | Freq: Once | INTRAMUSCULAR | Status: AC
  Administered 2015-04-18: 4 mg via INTRAVENOUS
  Filled 2015-04-18: qty 2

## 2015-04-18 MED ORDER — SODIUM CHLORIDE 0.9 % IV BOLUS (SEPSIS)
1000.0000 mL | Freq: Once | INTRAVENOUS | Status: AC
  Administered 2015-04-18: 1000 mL via INTRAVENOUS

## 2015-04-18 NOTE — ED Notes (Signed)
Pt reports acute onset of rt lower chest pain that began this afternoon approx 1600 - pt states pain is "right underneath my right ribs and it hurts to breath" - n/v x1 episode - pt admits to fever at home however has not checked his temperature.

## 2015-04-18 NOTE — ED Provider Notes (Signed)
[facility] Emergency Department Provider Note  ____________________________________________  Time seen: 9:20 PM  I have reviewed the triage vital signs and the nursing notes.   HISTORY  Chief Complaint Chest Pain and Shortness of Breath    HPI Anthony [name] is a 64 y.o. male who complains of right upper quadrant abdominal pain that started about 3:30 PM today he had last eaten lunch at 2:30 PM. Never had anything like this before. Since onset the pain was rapidly very severe, nonradiating, aching and sharp, right upper quadrant abdomen, no aggravating or alleviating factors except that it gets worse with deep breathing. No chest pain or shortness of breath. No fevers or chills, cough, dizziness or syncope. No diarrhea or vomiting. He ate some pineapple around 7:00 PM which made the pain a little bit worse.     Past Medical History  Diagnosis Date  . GERD (gastroesophageal reflux disease)   . Arthritis   . H/O mitral valve prolapse 2008 per echo    asymptomatic and does not see cardiologist.   . Hematuria   . Renal calculi     left  . Bilateral ureteral calculi   . History of kidney stones   . Renal insufficiency   . DDD (degenerative disc disease), lumbosacral   . Chronic back pain   . 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 Right ureteral calculus 02/05/2015  . Left ureteral calculus 02/05/2015  . Renal insufficiency 02/05/2015  . Ureteral stone 02/04/2015    Past Surgical History  Procedure Laterality Date  . Biceps tendon repair Right 2004 approx  . Cystoscopy with retrograde pyelogram, ureteroscopy and stent placement Right 01/24/2014    Procedure: CYSTOSCOPY WITH RETROGRADE PYELOGRAM, URETEROSCOPY AND STENT PLACEMENT, holmium laser, stone extraction;  Surgeon: Danae Chen, MD;  Location: [facility];  Service: Urology;   Laterality: Right;  . Cystoscopy with retrograde pyelogram, ureteroscopy and stent placement Right 02/07/2014    Procedure: RIGHT URETEROSCOPY, STONE EXTRACTION AND POSSIBLE STENT PLACEMENT;  Surgeon: Anner Crete, MD;  Location: [facility];  Service: Urology;  Laterality: Right;  . Holmium laser application Right 02/07/2014    Procedure: HOLMIUM LASER APPLICATION;  Surgeon: Anner Crete, MD;  Location: [facility];  Service: Urology;  Laterality: Right;  . Cystoscopy with retrograde pyelogram, ureteroscopy and stent placement Bilateral 02/04/2015    Procedure: CYSTOSCOPY WITH RETROGRADE PYELOGRAM, URETEROSCOPY AND STENT PLACEMENT, BILATERAL;  Surgeon: Jethro Bolus, MD;  Location: [facility];  Service: Urology;  Laterality: Bilateral;  . Extracorporeal shock wave lithotripsy  Left 02-13-2015//  Right 01-17-2014  . Cysto/  right retrograde pyelogram/ ureteroscopy attempted stone manipulation/  right stent placement  03-03-2009  . Right ureteroscopic stone extraction  03-13-2009  . Left ureteroscopic laser lithotripsy stone extraction w/ stent placement  03-09-2011;   09-18-2009  . Left ureteroscopic stone extraction w/ stent  09-25-2009  . Posterior fusion lumbar spine  09-08-2010    Re-do Laminectomy L3-S1 w/  Decompression and fusion   . Lumbar laminectomy  x2  ----  L3 - L5//  L5 -- S1  . Anterior cervical decomp/discectomy fusion  C5--C7  06-05-2002//  C7--T1   03-29-2006  . Transthoracic echocardiogram  07-04-2007    Mild focal basal septal hypertrophy/  ef 60%/  mild MVP involving anterior and posterior leaflets w/  trivial MR/  trivial pericardial effusion posterior to the heart  . Cystoscopy with ureteroscopy, stone basketry and stent placement Bilateral 02/20/2015    Procedure: CYSTOSCOPY WITH BILATERAL URETEROSCOPY, STONE EXTRACTION WITH LASER  AND STENT PLACEMENT;  Surgeon: Bjorn Pippin, MD;  Location: [facility];  Service: Urology;  Laterality:  Bilateral;  . Holmium laser application N/A 02/20/2015    Procedure: HOLMIUM LASER APPLICATION;  Surgeon: Bjorn Pippin, MD;  Location: [facility];  Service: Urology;  Laterality: N/A;    Current Outpatient Rx  Name  Route  Sig  Dispense  Refill  . HYDROmorphone (DILAUDID) 2 MG tablet   Oral   Take 1 tablet (2 mg total) by mouth every 4 (four) hours as needed for moderate pain or severe pain.   30 tablet   0   . omeprazole (PRILOSEC OTC) 20 MG tablet   Oral   Take 20 mg by mouth every morning.          . promethazine (PHENERGAN) 25 MG tablet   Oral   Take 1 tablet (25 mg total) by mouth every 6 (six) hours as needed for nausea or vomiting.   20 tablet   1     Allergies Codeine  Family History  Problem Relation Age of Onset  . Cancer Mother   . Diabetes Mother   . Hypertension Mother   . Hypertension Brother   . Stroke Mother   . Stroke Father   . Thyroid disease Daughter     Social History Social History  Substance Use Topics  . Smoking status: Former Smoker    Types: Pipe    Quit date: 02/17/2000  . Smokeless tobacco: Never Used  . Alcohol Use: Yes     Comment: occasionally     Review of Systems  Constitutional: No fever or chills. No weight changes Eyes:No blurry vision or double vision.  ENT: No sore throat. Cardiovascular: No chest pain. Respiratory: No dyspnea or cough. Gastrointestinal: Right upper quadrant abdominal pain without vomiting or diarrhea  No BRBPR or melena. Genitourinary: Negative for dysuria, urinary retention, bloody urine, or difficulty urinating. Musculoskeletal: Negative for back pain. No joint swelling or pain. Skin: Negative for rash. Neurological: Negative for headaches, focal weakness or numbness. Psychiatric:No anxiety or depression.   Endocrine:No hot/cold intolerance, changes in energy, or sleep difficulty.  10-point ROS otherwise negative.  ____________________________________________   PHYSICAL  EXAM:  VITAL SIGNS: ED Triage Vitals  Enc Vitals Group     BP 04/18/15 2145 146/88 mmHg     Pulse Rate 04/18/15 2145 74     Resp 04/18/15 2145 18     Temp 04/18/15 2145 98.5 F (36.9 C)     Temp Source 04/18/15 2145 Oral     SpO2 04/18/15 2145 98 %     Weight --      Height --      Head Cir --      Peak Flow --      Pain Score 04/18/15 2100 7     Pain Loc --      Pain Edu? --      Excl. in GC? --      Constitutional: Alert and oriented. Well appearing and in no distress. Eyes: No scleral icterus. No conjunctival pallor. PERRL. EOMI ENT   Head: Normocephalic and atraumatic.   Nose: No congestion/rhinnorhea. No septal hematoma   Mouth/Throat: MMM, no pharyngeal erythema. No peritonsillar mass. No uvula shift.   Neck: No stridor.  No SubQ emphysema. No meningismus. Hematological/Lymphatic/Immunilogical: No cervical lymphadenopathy. Cardiovascular: RRR. Normal and symmetric distal pulses are present in all extremities. No murmurs, rubs, or gallops. Respiratory: Normal respiratory effort without tachypnea nor retractions. Breath sounds are clear and equal bilaterally. No wheezes/rales/rhonchi. Gastrointestinal: Right upper quadrant tenderness with guarding of the area. There is a positive Murphy sign on exam.. No distention. There is right CVA tenderness.  No rebound, rigidity, or guarding. Genitourinary: deferred Musculoskeletal: Nontender with normal range of motion in all extremities. No joint effusions.  No lower extremity tenderness.  No edema. Neurologic:   Normal speech and language.  CN 2-10 normal. Motor grossly intact. No pronator drift.  Normal gait. No gross focal neurologic deficits are appreciated.  Skin:  Skin is warm, dry and intact. No rash noted.  No petechiae, purpura, or bullae. Psychiatric: Mood and affect are normal. Speech and behavior are normal. Patient exhibits appropriate insight and  judgment.  ____________________________________________    LABS (pertinent positives/negatives) (all labs ordered are listed, but only abnormal results are displayed) Labs Reviewed  BASIC METABOLIC PANEL - Abnormal; Notable for the following:    Creatinine, Ser 1.92 (*)    GFR calc non Af Amer 36 (*)    GFR calc Af Amer 41 (*)    All other components within normal limits  HEPATIC FUNCTION PANEL - Abnormal; Notable for the following:    AST 43 (*)    All other components within normal limits  CBC  TROPONIN I  LIPASE, BLOOD   ____________________________________________   EKG  Interpreted by me  Date: 04/18/2015  Rate: 77  Rhythm: normal sinus rhythm  QRS Axis: normal  Intervals: normal  ST/T Wave abnormalities: normal  Conduction Disutrbances: none  Narrative Interpretation: unremarkable      ____________________________________________    RADIOLOGY  Ultrasound right upper quadrant pending  ____________________________________________   PROCEDURES  ____________________________________________   INITIAL IMPRESSION / ASSESSMENT AND PLAN / ED COURSE  Pertinent labs & imaging results that were available during my care of the patient were reviewed by me and considered in my medical decision making (see chart for details).  Patient presents with right upper quadrant pain that is concerning for biliary colic versus cholecystitis. He is afebrile and unremarkable vital signs. Check labs and ultrasound and reassess. We'll give the patient IV Dilaudid fluids and Zofran. ----------------------------------------- 10:43 PM on 04/18/2015 -----------------------------------------  Labs unremarkable. Creatinine stable with chronic kidney disease. Still awaiting ultrasound right upper quadrant to evaluate the gallbladder. Care of the patient will be signed out to the oncoming physician Dr. Juliette Alcide at 11:00 PM. ____________________________________________   FINAL  CLINICAL IMPRESSION(S) / ED DIAGNOSES  Final diagnoses:  RUQ abdominal pain      Sharman Cheek, MD 04/18/15 2244

## 2015-04-18 NOTE — ED Notes (Signed)
[facility] main lab called to add hepatic function, spoke with Hilma Favors, states will add.

## 2015-04-19 ENCOUNTER — Encounter: Payer: Self-pay | Admitting: *Deleted

## 2015-04-19 ENCOUNTER — Observation Stay: Payer: BLUE CROSS/BLUE SHIELD | Admitting: Anesthesiology

## 2015-04-19 ENCOUNTER — Encounter
Admission: EM | Disposition: A | Discharge status: Home / Self Care | Payer: Self-pay | Source: Home / Self Care | Attending: Emergency Medicine

## 2015-04-19 DIAGNOSIS — R1013 Epigastric pain: Secondary | ICD-10-CM | POA: Insufficient documentation

## 2015-04-19 DIAGNOSIS — K819 Cholecystitis, unspecified: Secondary | ICD-10-CM | POA: Diagnosis not present

## 2015-04-19 DIAGNOSIS — R1011 Right upper quadrant pain: Secondary | ICD-10-CM | POA: Diagnosis not present

## 2015-04-19 DIAGNOSIS — K810 Acute cholecystitis: Secondary | ICD-10-CM | POA: Diagnosis not present

## 2015-04-19 HISTORY — PX: LAPAROSCOPIC CHOLECYSTECTOMY SINGLE PORT: SHX5891

## 2015-04-19 SURGERY — LAPAROSCOPIC CHOLECYSTECTOMY SINGLE SITE
Anesthesia: General

## 2015-04-19 MED ORDER — ACETAMINOPHEN 650 MG RE SUPP: 650.0000 mg | Freq: Four times a day (QID) | RECTAL | Status: DC | PRN

## 2015-04-19 MED ORDER — FAMOTIDINE 20 MG PO TABS
20.0000 mg | ORAL_TABLET | Freq: Every day | ORAL | Status: DC
  Administered 2015-04-20 – 2015-04-22 (×3): 20 mg via ORAL
  Filled 2015-04-19 (×3): qty 1

## 2015-04-19 MED ORDER — ONDANSETRON HCL 4 MG PO TABS: 4.0000 mg | ORAL_TABLET | Freq: Four times a day (QID) | ORAL | Status: DC | PRN

## 2015-04-19 MED ORDER — ONDANSETRON HCL 4 MG/2ML IJ SOLN
4.0000 mg | Freq: Four times a day (QID) | INTRAMUSCULAR | Status: DC | PRN
  Administered 2015-04-19 – 2015-04-21 (×5): 4 mg via INTRAVENOUS
  Filled 2015-04-19 (×3): qty 2

## 2015-04-19 MED ORDER — ROCURONIUM BROMIDE 100 MG/10ML IV SOLN
INTRAVENOUS | Status: DC | PRN
  Administered 2015-04-19: 30 mg via INTRAVENOUS

## 2015-04-19 MED ORDER — SUCCINYLCHOLINE CHLORIDE 20 MG/ML IJ SOLN
INTRAMUSCULAR | Status: DC | PRN
  Administered 2015-04-19: 100 mg via INTRAVENOUS

## 2015-04-19 MED ORDER — NEOSTIGMINE METHYLSULFATE 10 MG/10ML IV SOLN
INTRAVENOUS | Status: DC | PRN
  Administered 2015-04-19: 3 mg via INTRAVENOUS

## 2015-04-19 MED ORDER — GLYCOPYRROLATE 0.2 MG/ML IJ SOLN
INTRAMUSCULAR | Status: DC | PRN
Start: 2015-04-19 — End: 2015-04-19
  Administered 2015-04-19: 0.6 mg via INTRAVENOUS

## 2015-04-19 MED ORDER — PHENYLEPHRINE HCL 10 MG/ML IJ SOLN
INTRAMUSCULAR | Status: DC | PRN
  Administered 2015-04-19: 80 ug via INTRAVENOUS

## 2015-04-19 MED ORDER — LACTATED RINGERS IV SOLN
INTRAVENOUS | Status: DC | PRN
Start: 2015-04-19 — End: 2015-04-19
  Administered 2015-04-19: 11:00:00 via INTRAVENOUS

## 2015-04-19 MED ORDER — BUPIVACAINE-EPINEPHRINE (PF) 0.25% -1:200000 IJ SOLN
INTRAMUSCULAR | Status: AC
  Filled 2015-04-19: qty 30

## 2015-04-19 MED ORDER — FENTANYL CITRATE (PF) 100 MCG/2ML IJ SOLN
25.0000 ug | INTRAMUSCULAR | Status: DC | PRN
  Administered 2015-04-19 (×2): 25 ug via INTRAVENOUS

## 2015-04-19 MED ORDER — CEFAZOLIN SODIUM 1-5 GM-% IV SOLN
1.0000 g | Freq: Four times a day (QID) | INTRAVENOUS | Status: DC
  Administered 2015-04-19 – 2015-04-22 (×13): 1 g via INTRAVENOUS
  Filled 2015-04-19 (×19): qty 50

## 2015-04-19 MED ORDER — ACETAMINOPHEN 325 MG PO TABS: 650.0000 mg | ORAL_TABLET | Freq: Four times a day (QID) | ORAL | Status: DC | PRN

## 2015-04-19 MED ORDER — FENTANYL CITRATE (PF) 100 MCG/2ML IJ SOLN
INTRAMUSCULAR | Status: AC
  Filled 2015-04-19: qty 2

## 2015-04-19 MED ORDER — SODIUM CHLORIDE 0.9 % IV SOLN
3.0000 g | Freq: Four times a day (QID) | INTRAVENOUS | Status: DC
  Administered 2015-04-19: 3 g via INTRAVENOUS
  Filled 2015-04-19 (×3): qty 3

## 2015-04-19 MED ORDER — DIPHENHYDRAMINE HCL 50 MG/ML IJ SOLN
INTRAMUSCULAR | Status: AC
  Administered 2015-04-19: 50 mg via INTRAVENOUS
  Filled 2015-04-19: qty 1

## 2015-04-19 MED ORDER — HYDROMORPHONE HCL 1 MG/ML IJ SOLN
1.0000 mg | INTRAMUSCULAR | Status: DC | PRN
  Administered 2015-04-19 – 2015-04-21 (×9): 1 mg via INTRAVENOUS
  Filled 2015-04-19 (×10): qty 1

## 2015-04-19 MED ORDER — FENTANYL CITRATE (PF) 100 MCG/2ML IJ SOLN
INTRAMUSCULAR | Status: DC | PRN
  Administered 2015-04-19 (×2): 100 ug via INTRAVENOUS

## 2015-04-19 MED ORDER — INFLUENZA VAC SPLIT QUAD 0.5 ML IM SUSY
0.5000 mL | PREFILLED_SYRINGE | INTRAMUSCULAR | Status: AC
  Administered 2015-04-20: 0.5 mL via INTRAMUSCULAR
  Filled 2015-04-19: qty 0.5

## 2015-04-19 MED ORDER — BUPIVACAINE-EPINEPHRINE (PF) 0.25% -1:200000 IJ SOLN
INTRAMUSCULAR | Status: DC | PRN
  Administered 2015-04-19: 30 mL via PERINEURAL

## 2015-04-19 MED ORDER — ONDANSETRON HCL 4 MG/2ML IJ SOLN
4.0000 mg | Freq: Once | INTRAMUSCULAR | Status: DC | PRN
  Filled 2015-04-19: qty 2

## 2015-04-19 MED ORDER — MIDAZOLAM HCL 2 MG/2ML IJ SOLN
INTRAMUSCULAR | Status: DC | PRN
Start: 2015-04-19 — End: 2015-04-19
  Administered 2015-04-19: 2 mg via INTRAVENOUS

## 2015-04-19 MED ORDER — LIDOCAINE HCL (CARDIAC) 20 MG/ML IV SOLN
INTRAVENOUS | Status: DC | PRN
  Administered 2015-04-19: 60 mg via INTRAVENOUS

## 2015-04-19 MED ORDER — PROPOFOL 10 MG/ML IV BOLUS
INTRAVENOUS | Status: DC | PRN
Start: 2015-04-19 — End: 2015-04-19
  Administered 2015-04-19: 140 mg via INTRAVENOUS

## 2015-04-19 MED ORDER — OXYCODONE-ACETAMINOPHEN 5-325 MG PO TABS
1.0000 | ORAL_TABLET | ORAL | Status: DC | PRN
  Administered 2015-04-19 – 2015-04-22 (×6): 1 via ORAL
  Filled 2015-04-19 (×6): qty 1

## 2015-04-19 MED ORDER — ALUM & MAG HYDROXIDE-SIMETH 200-200-20 MG/5ML PO SUSP
30.0000 mL | Freq: Four times a day (QID) | ORAL | Status: DC | PRN
  Administered 2015-04-20 – 2015-04-22 (×2): 30 mL via ORAL
  Filled 2015-04-19 (×2): qty 30

## 2015-04-19 MED ORDER — KCL IN DEXTROSE-NACL 20-5-0.45 MEQ/L-%-% IV SOLN
INTRAVENOUS | Status: DC
  Administered 2015-04-19: 03:00:00 via INTRAVENOUS
  Administered 2015-04-19: 1000 mL via INTRAVENOUS
  Administered 2015-04-19: via INTRAVENOUS
  Filled 2015-04-19 (×8): qty 1000

## 2015-04-19 MED ORDER — DIPHENHYDRAMINE HCL 50 MG/ML IJ SOLN
50.0000 mg | Freq: Once | INTRAMUSCULAR | Status: AC
  Administered 2015-04-19: 50 mg via INTRAVENOUS

## 2015-04-19 MED ORDER — ENOXAPARIN SODIUM 40 MG/0.4ML ~~LOC~~ SOLN
40.0000 mg | SUBCUTANEOUS | Status: DC
  Administered 2015-04-19 – 2015-04-21 (×3): 40 mg via SUBCUTANEOUS
  Filled 2015-04-19 (×3): qty 0.4

## 2015-04-19 SURGICAL SUPPLY — 45 items
ADHESIVE MASTISOL STRL (MISCELLANEOUS) ×3 IMPLANT
APPLIER CLIP ROT 10 11.4 M/L (STAPLE) ×3
BLADE CLIPPER SURG (BLADE) ×3 IMPLANT
BLADE SURG SZ11 CARB STEEL (BLADE) ×3 IMPLANT
CANISTER SUCT 1200ML W/VALVE (MISCELLANEOUS) IMPLANT
CANISTER SUCT 3000ML PPV (MISCELLANEOUS) ×3 IMPLANT
CATH CHOLANGI 4FR 420404F (CATHETERS) IMPLANT
CHLORAPREP W/TINT 26ML (MISCELLANEOUS) ×3 IMPLANT
CLIP APPLIE ROT 10 11.4 M/L (STAPLE) ×1 IMPLANT
CLOSURE WOUND 1/2 X4 (GAUZE/BANDAGES/DRESSINGS) ×1
CONRAY 60ML FOR OR (MISCELLANEOUS) IMPLANT
DRAPE C-ARM XRAY 36X54 (DRAPES) IMPLANT
ENDOPOUCH RETRIEVER 10 (MISCELLANEOUS) ×3 IMPLANT
GAUZE SPONGE NON-WVN 2X2 STRL (MISCELLANEOUS) ×4 IMPLANT
GLOVE BIO SURGEON STRL SZ8 (GLOVE) ×9 IMPLANT
GOWN STRL REUS W/ TWL LRG LVL3 (GOWN DISPOSABLE) ×2 IMPLANT
GOWN STRL REUS W/TWL LRG LVL3 (GOWN DISPOSABLE) ×4
HEMOSTAT SURGICEL 2X3 (HEMOSTASIS) ×3 IMPLANT
IRRIGATION STRYKERFLOW (MISCELLANEOUS) ×1 IMPLANT
IRRIGATOR STRYKERFLOW (MISCELLANEOUS) ×3
IV CATH ANGIO 12GX3 LT BLUE (NEEDLE) IMPLANT
IV NS 1000ML (IV SOLUTION) ×4
IV NS 1000ML BAXH (IV SOLUTION) ×2 IMPLANT
JACKSON PRATT 10 (INSTRUMENTS) ×3 IMPLANT
KIT RM TURNOVER STRD PROC AR (KITS) ×3 IMPLANT
LABEL OR SOLS (LABEL) ×3 IMPLANT
NDL SAFETY 22GX1.5 (NEEDLE) ×3 IMPLANT
NEEDLE VERESS 14GA 120MM (NEEDLE) ×3 IMPLANT
NS IRRIG 500ML POUR BTL (IV SOLUTION) ×3 IMPLANT
PACK LAP CHOLECYSTECTOMY (MISCELLANEOUS) ×3 IMPLANT
PAD GROUND ADULT SPLIT (MISCELLANEOUS) ×3 IMPLANT
SCISSORS METZENBAUM CVD 33 (INSTRUMENTS) ×3 IMPLANT
SLEEVE ENDOPATH XCEL 5M (ENDOMECHANICALS) ×3 IMPLANT
SPONGE EXCIL AMD DRAIN 4X4 6P (MISCELLANEOUS) ×3 IMPLANT
SPONGE LAP 18X18 5 PK (GAUZE/BANDAGES/DRESSINGS) ×3 IMPLANT
SPONGE VERSALON 2X2 STRL (MISCELLANEOUS) ×8
STRIP CLOSURE SKIN 1/2X4 (GAUZE/BANDAGES/DRESSINGS) ×2 IMPLANT
SUT MNCRL 4-0 (SUTURE) ×2
SUT MNCRL 4-0 27XMFL (SUTURE) ×1
SUT VICRYL 0 AB UR-6 (SUTURE) ×3 IMPLANT
SUTURE MNCRL 4-0 27XMF (SUTURE) ×1 IMPLANT
SYR 20CC LL (SYRINGE) ×3 IMPLANT
TROCAR XCEL NON-BLD 11X100MML (ENDOMECHANICALS) ×3 IMPLANT
TROCAR XCEL NON-BLD 5MMX100MML (ENDOMECHANICALS) ×6 IMPLANT
TUBING INSUFFLATOR HI FLOW (MISCELLANEOUS) ×3 IMPLANT

## 2015-04-19 NOTE — Anesthesia Postprocedure Evaluation (Signed)
  Anesthesia Post-op Note  Patient: Anthony [name]  Procedure(s) Performed: Procedure(s): LAPAROSCOPIC CHOLECYSTECTOMY (N/A)  Anesthesia type:General  Patient location: PACU  Post pain: Pain level controlled  Post assessment: Post-op Vital signs reviewed, Patient's Cardiovascular Status Stable, Respiratory Function Stable, Patent Airway and No signs of Nausea or vomiting  Post vital signs: Reviewed and stable  Last Vitals:  Filed Vitals:   04/19/15 1226  BP: 144/97  Pulse: 94  Temp: 36.8 C  Resp: 20    Level of consciousness: awake, alert  and patient cooperative  Complications: No apparent anesthesia complications

## 2015-04-19 NOTE — Progress Notes (Signed)
Patient seen in preop area and reviewed history and risks in detail again patient understood and agreed to proceed

## 2015-04-19 NOTE — Progress Notes (Signed)
Preoperative Review   Patient is met in the preoperative holding area. The history is reviewed in the chart and with the patient. I personally reviewed the options and rationale as well as the risks of this procedure that have been previously discussed with the patient. All questions asked by the patient and/or family were answered to their satisfaction.  Patient agrees to proceed with this procedure at this time.  Dolton Shaker E Walt Geathers M.D. FACS  

## 2015-04-19 NOTE — Anesthesia Preprocedure Evaluation (Signed)
Anesthesia Evaluation  Patient identified by MRN, date of birth, ID band Patient awake    Reviewed: Allergy & Precautions, NPO status , Patient's Chart, lab work & pertinent test results  Airway Mallampati: II  TM Distance: >3 FB Neck ROM: Full    Dental  (+) Missing, Chipped   Pulmonary former smoker (quit x 10 yrs),          Cardiovascular + Valvular Problems/Murmurs MVP     Neuro/Psych    GI/Hepatic GERD-  Medicated,  Endo/Other    Renal/GU Renal disease (stones)     Musculoskeletal  (+) Arthritis -, Osteoarthritis,    Abdominal   Peds  Hematology   Anesthesia Other Findings   Reproductive/Obstetrics                             Anesthesia Physical Anesthesia Plan  ASA: II  Anesthesia Plan: General   Post-op Pain Management:    Induction: Intravenous and Rapid sequence  Airway Management Planned: Oral ETT  Additional Equipment:   Intra-op Plan:   Post-operative Plan:   Informed Consent: I have reviewed the patients History and Physical, chart, labs and discussed the procedure including the risks, benefits and alternatives for the proposed anesthesia with the patient or authorized representative who has indicated his/her understanding and acceptance.     Plan Discussed with:   Anesthesia Plan Comments:         Anesthesia Quick Evaluation

## 2015-04-19 NOTE — Anesthesia Procedure Notes (Signed)
Procedure Name: Intubation Date/Time: 04/19/2015 11:24 AM Performed by: IONGEXB, Lucius Wise Pre-anesthesia Checklist: Emergency Drugs available, Patient identified, Timeout performed and Patient being monitored Patient Re-evaluated:Patient Re-evaluated prior to inductionOxygen Delivery Method: Circle system utilized Preoxygenation: Pre-oxygenation with 100% oxygen Intubation Type: IV induction and Rapid sequence Laryngoscope Size: Mac and 3 Grade View: Grade II Tube type: Oral Tube size: 7.5 mm Number of attempts: 1

## 2015-04-19 NOTE — ED Provider Notes (Addendum)
Right upper quadrant ultrasound returns suspicious for cholecystitis. Patient still having pain 2-3 out of 10. Discussed with surgeon surgeon comes down will admit patient  Arnaldo Natal, MD 04/19/15 0033  ----------------------------------------- 1:51 AM on 04/19/2015 -----------------------------------------  Patient had Unasyn ordered by the surgeon. Patient is not itching everywhere feels like things are crawling under his skin. He does not have a rash is not short of breath. We'll give him Benadryl 50 IV and follow him. He knows to let us know immediately if he develops hives or shortness of breath or anything else.  Arnaldo Natal, MD 04/19/15 [phone]

## 2015-04-19 NOTE — Transfer of Care (Signed)
Immediate Anesthesia Transfer of Care Note  Patient: Anthony [name]  Procedure(s) Performed: Procedure(s): LAPAROSCOPIC CHOLECYSTECTOMY (N/A)  Patient Location:    Anesthesia Type:General  Level of Consciousness: awake and alert   Airway & Oxygen Therapy: Patient connected to face mask oxygen  Post-op Assessment: Report given to RN  Post vital signs: stable  Last Vitals:  Filed Vitals:   04/19/15 1226  BP: 144/97  Pulse: 94  Temp: 36.8 C  Resp: 20    Complications: No apparent anesthesia complications

## 2015-04-19 NOTE — ED Notes (Signed)
Pharmacy notified regarding Unasyn, spoke with Apolinar Junes, states will send.

## 2015-04-19 NOTE — Op Note (Signed)
Laparoscopic Cholecystectomy  Pre-operative Diagnosis: Acute cholecystitis  Post-operative Diagnosis: Acute cholecystitis with empyema of the gallbladder  Procedure: Laparoscopic cholecystectomy  Surgeon: Adah Salvage. Excell Seltzer, MD FACS  Anesthesia: Gen. with endotracheal tube  Assistant: Surgical tech  Procedure Details  The patient was seen again in the Holding Room. The benefits, complications, treatment options, and expected outcomes were discussed with the patient. The risks of bleeding, infection, recurrence of symptoms, failure to resolve symptoms, bile duct damage, bile duct leak, retained common bile duct stone, bowel injury, any of which could require further surgery and/or ERCP, stent, or papillotomy were reviewed with the patient. The likelihood of improving the patient's symptoms with return to their baseline status is good.  The patient and/or family concurred with the proposed plan, giving informed consent.  The patient was taken to Operating Room, identified as Anthony [name] and the procedure verified as Laparoscopic Cholecystectomy.  A Time Out was held and the above information confirmed.  Prior to the induction of general anesthesia, antibiotic prophylaxis was administered. VTE prophylaxis was in place. General endotracheal anesthesia was then administered and tolerated well. After the induction, the abdomen was prepped with Chloraprep and draped in the sterile fashion. The patient was positioned in the supine position.  Local anesthetic  was injected into the skin near the umbilicus and an incision made. The Veress needle was placed. Pneumoperitoneum was then created with CO2 and tolerated well without any adverse changes in the patient's vital signs. A 5mm port was placed in the periumbilical position and the abdominal cavity was explored.  Two 5-mm ports were placed in the right upper quadrant and a 12 mm epigastric port was placed all under direct vision. All skin incisions   were infiltrated with a local anesthetic agent before making the incision and placing the trocars.   The patient was positioned  in reverse Trendelenburg, tilted slightly to the patient's left.  The gallbladder was identified, the fundus grasped and retracted cephalad. Adhesions were lysed bluntly. The infundibulum was grasped and retracted laterally, exposing the peritoneum overlying the triangle of Calot. This was then divided and exposed in a blunt fashion. A critical view of the cystic duct and cystic artery was obtained.  The cystic duct was clearly identified and bluntly dissected.   The cystic duct was doubly clipped and divided and the cystic artery was doubly clipped and divided. There were multiple small vessels especially on the right lateral side of the gallbladder which were clipped as well. The gallbladder ruptured due to acute necrosis during traction and purulence was spilled which was aspirated and irrigated until clear.  The gallbladder was taken from the gallbladder fossa in a retrograde fashion with the electrocautery. The gallbladder was removed and placed in an Endocatch bag. The liver bed was irrigated and inspected. Hemostasis was achieved with the electrocautery. Copious irrigation was utilized and was repeatedly aspirated until clear.  The gallbladder and Endocatch sac were then removed through the epigastric port site.   Purulence was aspirated and irrigated with a total of 2 L of irrigant. A 10 mm JP drain was brought in through a lateral port site and placed in the foramen of Winslow and held in with a 3-0 nylon. Surgicel was placed in the gallbladder fossa due to minor non-arterial bleeding that did not respond well to elect a cautery hemostasis. There was no identifiable surgical bleeding at the end of the case.  Inspection of the right upper quadrant was performed. No bleeding, bile duct injury  or leak, or bowel injury was noted. Pneumoperitoneum was released.  The  epigastric port site was closed with figure-of-eight 0 Vicryl sutures. 4-0 subcuticular Monocryl was used to close the skin. Steristrips and Mastisol and sterile dressings were  applied.  The patient was then extubated and brought to the recovery room in stable condition. Sponge, lap, and needle counts were correct at closure and at the conclusion of the case.   Findings: Empyema and acute Cholecystitis   Estimated Blood Loss: 50 cc         Drains: JP drain         Specimens: Gallbladder           Complications: none               Larenda Reedy E. Excell Seltzer, MD, FACS

## 2015-04-19 NOTE — H&P (Addendum)
Anthony [name] is an 64 y.o. male.    Chief Complaint: Severe right upper quadrant pain  HPI: 64 year old male with approximate 48 hours history of abdominal pain which he describes as gas however at approximately 3:00 this afternoon on Friday the patient after having a barbecue meal in [place] developed the sudden onset of epigastric and right upper quadrant abdominal pain which doubled him over associated with nausea and vomiting. He was also reports having diaphoresis. The patient denies any previous abdominal pain like this in the past nor fatty food intolerance. He has a long and extensive history of renal stones requiring multiple interventions including cystoscopy with stenting along with Holmium laser therapy as well as ESWL as early as June of this year.  The patient denies any hematuria, dysuria or flank pain. He is characterizing this pain as completely different than that related to prior kidney stone disease. The patient denies any prior history of cholelithiasis. There is been no fever and no jaundice. No prior abdominal operations are reported. He is accompanied by his significant other. No sick contacts reported.  He had some narcotics and antiemetics in ER with minimal relief.   Past Medical History  Diagnosis Date  . GERD (gastroesophageal reflux disease)   . Arthritis   . H/O mitral valve prolapse 2008 per echo    asymptomatic and does not see cardiologist.   . Hematuria   . Renal calculi     left  . Bilateral ureteral calculi   . History of kidney stones   . Renal insufficiency   . DDD (degenerative disc disease), lumbosacral   . Chronic back pain   . 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 Biceps tendon repair Right 2004 approx  . Cystoscopy with retrograde pyelogram, ureteroscopy and stent placement Right 01/24/2014    Procedure: CYSTOSCOPY WITH RETROGRADE PYELOGRAM,  URETEROSCOPY AND STENT PLACEMENT, holmium laser, stone extraction;  Surgeon: Arvil Persons, MD;  Location: [facility];  Service: Urology;  Laterality: Right;  . Cystoscopy with retrograde pyelogram, ureteroscopy and stent placement Right 02/07/2014    Procedure: RIGHT URETEROSCOPY, STONE EXTRACTION AND POSSIBLE STENT PLACEMENT;  Surgeon: Malka So, MD;  Location: [facility];  Service: Urology;  Laterality: Right;  . Holmium laser application Right 6/94/5038    Procedure: HOLMIUM LASER APPLICATION;  Surgeon: Malka So, MD;  Location: [facility];  Service: Urology;  Laterality: Right;  . Cystoscopy with retrograde pyelogram, ureteroscopy and stent placement Bilateral 02/04/2015    Procedure: CYSTOSCOPY WITH RETROGRADE PYELOGRAM, URETEROSCOPY AND STENT PLACEMENT, BILATERAL;  Surgeon: Carolan Clines, MD;  Location: [facility];  Service: Urology;  Laterality: Bilateral;  . Extracorporeal shock wave lithotripsy  Left 02-13-2015//  Right 01-17-2014  . Cysto/  right retrograde pyelogram/ ureteroscopy attempted stone manipulation/  right stent placement  03-03-2009  . Right ureteroscopic stone extraction  03-13-2009  . Left ureteroscopic laser lithotripsy stone extraction w/ stent placement  03-09-2011;   09-18-2009  . Left ureteroscopic stone extraction w/ stent  09-25-2009  . Posterior fusion lumbar spine  09-08-2010    Re-do Laminectomy L3-S1 w/  Decompression and fusion   . Lumbar laminectomy  x2  ----  L3 - L5//  L5 -- S1  . Anterior cervical decomp/discectomy fusion  C5--C7  06-05-2002//  C7--T1   03-29-2006  . Transthoracic echocardiogram  07-04-2007    Mild focal basal septal hypertrophy/  ef 60%/  mild MVP involving anterior and posterior leaflets w/ trivial MR/  trivial pericardial effusion posterior to the heart  . Cystoscopy with ureteroscopy, stone basketry and stent placement Bilateral 02/20/2015    Procedure: CYSTOSCOPY WITH BILATERAL  URETEROSCOPY, STONE EXTRACTION WITH LASER  AND STENT PLACEMENT;  Surgeon: Irine Seal, MD;  Location: [facility];  Service: Urology;  Laterality: Bilateral;  . Holmium laser application N/A 12/17/621    Procedure: HOLMIUM LASER APPLICATION;  Surgeon: Irine Seal, MD;  Location: [facility];  Service: Urology;  Laterality: N/A;    Family History  Problem Relation Age of Onset  . Cancer Mother   . Diabetes Mother   . Hypertension Mother   . Hypertension Brother   . Stroke Mother   . Stroke Father   . Thyroid disease Daughter    Social History:  reports that he quit smoking about 15 years ago. His smoking use included Pipe. He has never used smokeless tobacco. He reports that he drinks alcohol. He reports that he does not use illicit drugs.  Family history is negative for cholelithiasis.  Allergies:  Allergies  Allergen Reactions  . Codeine Itching    Results for orders placed or performed during the hospital encounter of 04/18/15 (from the past 48 hour(s))  Basic metabolic panel     Status: Abnormal   Collection Time: 04/18/15  9:02 PM  Result Value Ref Range   Sodium 140 135 - 145 mmol/L   Potassium 4.0 3.5 - 5.1 mmol/L   Chloride 108 101 - 111 mmol/L   CO2 24 22 - 32 mmol/L   Glucose, Bld 98 65 - 99 mg/dL   BUN 18 6 - 20 mg/dL   Creatinine, Ser 1.92 (H) 0.61 - 1.24 mg/dL   Calcium 9.1 8.9 - 10.3 mg/dL   GFR calc non Af Amer 36 (L) >60 mL/min   GFR calc Af Amer 41 (L) >60 mL/min    Comment: (NOTE) The eGFR has been calculated using the CKD EPI equation. This calculation has not been validated in all clinical situations. eGFR's persistently <60 mL/min signify possible Chronic Kidney Disease.    Anion gap 8 5 - 15  CBC     Status: None   Collection Time: 04/18/15  9:02 PM  Result Value Ref Range   WBC 9.9 3.8 - 10.6 K/uL   RBC 5.20 4.40 - 5.90 MIL/uL   Hemoglobin 14.7 13.0 - 18.0 g/dL   HCT 44.7 40.0 - 52.0 %   MCV 86.0 80.0 - 100.0 fL    MCH 28.4 26.0 - 34.0 pg   MCHC 33.0 32.0 - 36.0 g/dL   RDW 14.4 11.5 - 14.5 %   Platelets 186 150 - 440 K/uL  Troponin I     Status: None   Collection Time: 04/18/15  9:02 PM  Result Value Ref Range   Troponin I <0.03 <0.031 ng/mL    Comment:        NO INDICATION OF MYOCARDIAL INJURY.   Lipase, blood     Status: None   Collection Time: 04/18/15  9:02 PM  Result Value Ref Range   Lipase 29 22 - 51 U/L  Hepatic function panel     Status: Abnormal   Collection Time: 04/18/15  9:03 PM  Result Value Ref Range   Total Protein 6.9 6.5 - 8.1 g/dL   Albumin 3.9 3.5 - 5.0 g/dL   AST  43 (H) 15 - 41 U/L   ALT 27 17 - 63 U/L   Alkaline Phosphatase 104 38 - 126 U/L   Total Bilirubin 0.8 0.3 - 1.2 mg/dL   Bilirubin, Direct 0.2 0.1 - 0.5 mg/dL   Indirect Bilirubin 0.6 0.3 - 0.9 mg/dL   Dg Chest 2 View  04/18/2015   CLINICAL DATA:  Pt reports acute onset of rt lower chest pain that began this afternoon approx 1600 - pt states pain is "right underneath my right ribs and it hurts to breath" - n/v x1 episode - pt admits to fever at home however has not checked his temperature.  EXAM: CHEST  2 VIEW  COMPARISON:  09/25/2009  FINDINGS: Cardiac silhouette is normal in size and configuration. Small hiatal hernia. No mediastinal or hilar masses or convincing adenopathy.  Lungs are clear.  No pleural effusion or pneumothorax.  There stable changes from previous anterior cervical spine fusions.  IMPRESSION: No active cardiopulmonary disease.   Electronically Signed   By: Lajean Manes M.D.   On: 04/18/2015 21:23   US Abdomen Limited Ruq  04/18/2015   CLINICAL DATA:  Right upper quadrant pain and tenderness. Worse after eating. Pain for 6 hours.  EXAM: US ABDOMEN LIMITED - RIGHT UPPER QUADRANT  COMPARISON:  None.  FINDINGS: Gallbladder:  The gallbladder is distended measuring greater than 12 cm in longitudinal dimension containing intraluminal sludge and probable small stones. There is diffuse gallbladder wall  thickening. Wall thickness measures up to 6 mm. Limited assessment for sonographic Percell Miller sign given patient was medicated prior to the exam.  Common bile duct:  Diameter: 4.7 mm, normal.  Liver:  No focal lesion identified. Within normal limits in parenchymal echogenicity. There is normal directional flow in the main portal vein.  IMPRESSION: 1. Distended gallbladder containing intraluminal sludge and probable small stones with diffuse gallbladder wall thickening. Findings are concerning for cholecystitis, may be acute. 2. No biliary dilatation.   Electronically Signed   By: Jeb Levering M.D.   On: 04/18/2015 23:21    Review of Systems  Constitutional: Positive for diaphoresis. Negative for fever, weight loss and malaise/fatigue.  Eyes: Negative.   Gastrointestinal: Positive for heartburn, nausea, vomiting and abdominal pain. Negative for diarrhea, constipation, blood in stool and melena.  Genitourinary: Negative for dysuria, urgency, frequency, hematuria and flank pain.  Musculoskeletal: Negative.   Endo/Heme/Allergies: Negative.   Psychiatric/Behavioral: Negative.     Blood pressure 154/90, pulse 86, temperature 98.5 F (36.9 C), temperature source Oral, resp. rate 19, SpO2 98 %. Physical Exam  Constitutional: He is oriented to person, place, and time. He appears well-developed and well-nourished. No distress.  HENT:  Head: Normocephalic and atraumatic.  Mouth/Throat: Oropharynx is clear and moist and mucous membranes are normal.  Eyes: Conjunctivae are normal. Pupils are equal, round, and reactive to light.  Neck: Normal range of motion. No JVD present. No tracheal deviation present. No thyromegaly present.  Cardiovascular: Normal rate, regular rhythm and normal heart sounds.  Exam reveals no gallop.   No murmur heard. Respiratory: Effort normal and breath sounds normal. No respiratory distress. He has no wheezes.  GI: Soft. Normal appearance. He exhibits no distension and no mass.  There is tenderness in the right upper quadrant and epigastric area. There is positive Murphy's sign. There is no rebound and no guarding.    Musculoskeletal: He exhibits no edema or tenderness.  Lymphadenopathy:    He has no cervical adenopathy.  Neurological: He is oriented to person,  place, and time.  Skin: Skin is dry. He is not diaphoretic.  Psychiatric: He has a normal mood and affect. His behavior is normal. Judgment and thought content normal. His mood appears not anxious. His affect is not angry and not labile. Cognition and memory are normal. He does not exhibit a depressed mood.     Assessment/Plan 64 year old male with acute calculus cholecystitis. Kidney stones disease-chronic issue  The patient will be taken to the operating room tomorrow for laparoscopic cholecystectomy under the care of Dr. Burt Knack my associate. I'll gliding of the case posted for tomorrow. I'll allow Dr. Burt Knack to obtain informed consent and she will be doing the procedure. The patient and his significant other breath in agreement with this plan and wished to proceed in this fashion.  I personally reviewed all the ultrasound images on the PACS monitor.   Sherri Rad, MD FACS  04/19/2015, 12:34 AM

## 2015-04-20 LAB — CBC WITH DIFFERENTIAL/PLATELET
Basophils Absolute: 0 10*3/uL (ref 0–0.1)
Basophils Relative: 0 %
Eosinophils Absolute: 0 10*3/uL (ref 0–0.7)
Eosinophils Relative: 0 %
HCT: 42.8 % (ref 40.0–52.0)
Hemoglobin: 14 g/dL (ref 13.0–18.0)
Lymphocytes Relative: 8 %
Lymphs Abs: 1 10*3/uL (ref 1.0–3.6)
MCH: 28.5 pg (ref 26.0–34.0)
MCHC: 32.7 g/dL (ref 32.0–36.0)
MCV: 87 fL (ref 80.0–100.0)
Monocytes Absolute: 1.2 10*3/uL — ABNORMAL HIGH (ref 0.2–1.0)
Monocytes Relative: 9 %
Neutro Abs: 10.9 10*3/uL — ABNORMAL HIGH (ref 1.4–6.5)
Neutrophils Relative %: 83 %
Platelets: 142 10*3/uL — ABNORMAL LOW (ref 150–440)
RBC: 4.92 MIL/uL (ref 4.40–5.90)
RDW: 14.5 % (ref 11.5–14.5)
WBC: 13.1 10*3/uL — ABNORMAL HIGH (ref 3.8–10.6)

## 2015-04-20 LAB — COMPREHENSIVE METABOLIC PANEL
ALT: 33 U/L (ref 17–63)
AST: 38 U/L (ref 15–41)
Albumin: 3.3 g/dL — ABNORMAL LOW (ref 3.5–5.0)
Alkaline Phosphatase: 81 U/L (ref 38–126)
Anion gap: 5 (ref 5–15)
BUN: 17 mg/dL (ref 6–20)
CO2: 27 mmol/L (ref 22–32)
Calcium: 8.5 mg/dL — ABNORMAL LOW (ref 8.9–10.3)
Chloride: 103 mmol/L (ref 101–111)
Creatinine, Ser: 1.53 mg/dL — ABNORMAL HIGH (ref 0.61–1.24)
GFR calc Af Amer: 55 mL/min — ABNORMAL LOW (ref >60)
GFR calc non Af Amer: 47 mL/min — ABNORMAL LOW (ref >60)
Glucose, Bld: 138 mg/dL — ABNORMAL HIGH (ref 65–99)
Potassium: 4.2 mmol/L (ref 3.5–5.1)
Sodium: 135 mmol/L (ref 135–145)
Total Bilirubin: 1.6 mg/dL — ABNORMAL HIGH (ref 0.3–1.2)
Total Protein: 6.3 g/dL — ABNORMAL LOW (ref 6.5–8.1)

## 2015-04-20 MED ORDER — OXYCODONE-ACETAMINOPHEN 5-325 MG PO TABS: 1.0000 | ORAL_TABLET | ORAL | Status: DC | PRN

## 2015-04-20 NOTE — Progress Notes (Signed)
1 Day Post-Op  Subjective: Status post laparoscopic cholecystectomy. The operation revealed empyema of the gallbladder. He is feeling much better today no nausea vomiting no fevers or chills.  Objective: Vital signs in last 24 hours: Temp:  [97 F (36.1 C)-98.9 F (37.2 C)] 98.7 F (37.1 C) (08/28 0806) Pulse Rate:  [65-119] 98 (08/28 0806) Resp:  [14-27] 17 (08/28 0806) BP: (98-146)/(58-97) 126/74 mmHg (08/28 0806) SpO2:  [93 %-100 %] 95 % (08/28 0806) Weight:  [184 lb 4.8 oz (83.598 kg)] 184 lb 4.8 oz (83.598 kg) (08/28 0410) Last BM Date: 04/18/15  Intake/Output from previous day: 08/27 0701 - 08/28 0700 In: 4004.3 [P.O.:60; I.V.:3844.3; IV Piggyback:100] Out: 1830 [Urine:1650; Drains:160; Blood:20] Intake/Output this shift:    Physical exam:  Abdomen is soft nontender wounds are dressed no purulence in drain. Calves are nontender.  Lab Results: CBC   Recent Labs  04/18/15 2102 04/20/15 0652  WBC 9.9 13.1*  HGB 14.7 14.0  HCT 44.7 42.8  PLT 186 142*   BMET  Recent Labs  04/18/15 2102 04/20/15 0652  NA 140 135  K 4.0 4.2  CL 108 103  CO2 24 27  GLUCOSE 98 138*  BUN 18 17  CREATININE 1.92* 1.53*  CALCIUM 9.1 8.5*   PT/INR No results for input(s): LABPROT, INR in the last 72 hours. ABG No results for input(s): PHART, HCO3 in the last 72 hours.  Invalid input(s): PCO2, PO2  Studies/Results: Dg Chest 2 View  04/18/2015   CLINICAL DATA:  Pt reports acute onset of rt lower chest pain that began this afternoon approx 1600 - pt states pain is "right underneath my right ribs and it hurts to breath" - n/v x1 episode - pt admits to fever at home however has not checked his temperature.  EXAM: CHEST  2 VIEW  COMPARISON:  09/25/2009  FINDINGS: Cardiac silhouette is normal in size and configuration. Small hiatal hernia. No mediastinal or hilar masses or convincing adenopathy.  Lungs are clear.  No pleural effusion or pneumothorax.  There stable changes from  previous anterior cervical spine fusions.  IMPRESSION: No active cardiopulmonary disease.   Electronically Signed   By: Amie Portland M.D.   On: 04/18/2015 21:23   US Abdomen Limited Ruq  04/18/2015   CLINICAL DATA:  Right upper quadrant pain and tenderness. Worse after eating. Pain for 6 hours.  EXAM: US ABDOMEN LIMITED - RIGHT UPPER QUADRANT  COMPARISON:  None.  FINDINGS: Gallbladder:  The gallbladder is distended measuring greater than 12 cm in longitudinal dimension containing intraluminal sludge and probable small stones. There is diffuse gallbladder wall thickening. Wall thickness measures up to 6 mm. Limited assessment for sonographic Eulah Pont sign given patient was medicated prior to the exam.  Common bile duct:  Diameter: 4.7 mm, normal.  Liver:  No focal lesion identified. Within normal limits in parenchymal echogenicity. There is normal directional flow in the main portal vein.  IMPRESSION: 1. Distended gallbladder containing intraluminal sludge and probable small stones with diffuse gallbladder wall thickening. Findings are concerning for cholecystitis, may be acute. 2. No biliary dilatation.   Electronically Signed   By: Rubye Oaks M.D.   On: 04/18/2015 23:21    Anti-infectives: Anti-infectives    Start     Dose/Rate Route Frequency Ordered Stop   04/19/15 0730  ceFAZolin (ANCEF) IVPB 1 g/50 mL premix     1 g 100 mL/hr over 30 Minutes Intravenous 4 times per day 04/19/15 0720     04/19/15  0045  Ampicillin-Sulbactam (UNASYN) 3 g in sodium chloride 0.9 % 100 mL IVPB  Status:  Discontinued     3 g 100 mL/hr over 60 Minutes Intravenous Every 6 hours 04/19/15 0043 04/19/15 0720      Assessment/Plan: s/p Procedure(s): LAPAROSCOPIC CHOLECYSTECTOMY   Patient doing better continue IV anabiotic's for now likely home tomorrow drain can be removed prior to discharge.Lattie Haw, MD, FACS  04/20/2015

## 2015-04-20 NOTE — Discharge Instructions (Signed)
Remove dressing in 24 hours. °May shower in 24 hours. °Leave paper strips in place. °Resume all home medications. °Follow-up with Dr. Alexa Golebiewski in 10 days. °

## 2015-04-21 ENCOUNTER — Encounter: Payer: Self-pay | Admitting: Surgery

## 2015-04-21 MED ORDER — BISACODYL 10 MG RE SUPP
10.0000 mg | Freq: Once | RECTAL | Status: AC
  Administered 2015-04-21: 10 mg via RECTAL
  Filled 2015-04-21: qty 1

## 2015-04-21 MED ORDER — DIAZEPAM 5 MG/ML IJ SOLN: 2.5000 mg | Freq: Three times a day (TID) | INTRAMUSCULAR | Status: DC | PRN

## 2015-04-21 MED ORDER — DIAZEPAM 2 MG PO TABS
2.0000 mg | ORAL_TABLET | Freq: Three times a day (TID) | ORAL | Status: DC | PRN
  Administered 2015-04-21: 2 mg via ORAL
  Filled 2015-04-21: qty 1

## 2015-04-21 MED ORDER — POLYETHYLENE GLYCOL 3350 17 G PO PACK
17.0000 g | PACK | Freq: Every day | ORAL | Status: DC
  Administered 2015-04-21: 17 g via ORAL
  Filled 2015-04-21 (×2): qty 1

## 2015-04-21 NOTE — Progress Notes (Signed)
PM Rounds  Patient mostly complaining of the need to have a BM with cramps but unable to produce stool. States abdominal pain is otherwise improved from earlier.  Today's Vitals   04/21/15 1354 04/21/15 1437 04/21/15 1530 04/21/15 2053  BP:   139/85   Pulse:   80   Temp:   98 F (36.7 C)   TempSrc:   Oral   Resp:   18   Height:      Weight:      SpO2:   100%   PainSc: GEN: NAD ABD: Soft, NT, ND  Will provide with singe time suppository. Dulcolax ordered.   Ricarda Frame, MD FACS General Surgeon [facility]

## 2015-04-21 NOTE — Progress Notes (Signed)
Surgery Progress Note  S: Feels better.  Still not much appetite O: Blood pressure 127/80, pulse 78, temperature 98.7 F (37.1 C), temperature source Oral, resp. rate 18, height  (1.778 m), weight 170 lb 11.2 oz (77.429 kg), SpO2 95 %. GEN: NAD/A&Ox3 ABD; soft, min tender, nondistended  A/P 64 yo s/p lap chole for empyema, doing well - possible d/c later

## 2015-04-22 LAB — URINALYSIS COMPLETE WITH MICROSCOPIC (ARMC ONLY)
Bacteria, UA: NONE SEEN
Bilirubin Urine: NEGATIVE
Glucose, UA: NEGATIVE mg/dL
Hgb urine dipstick: NEGATIVE
Ketones, ur: NEGATIVE mg/dL
Leukocytes, UA: NEGATIVE
Nitrite: NEGATIVE
Protein, ur: 30 mg/dL — AB
Specific Gravity, Urine: 1.018 (ref 1.005–1.030)
Squamous Epithelial / LPF: NONE SEEN
pH: 6 (ref 5.0–8.0)

## 2015-04-22 MED ORDER — OXYCODONE-ACETAMINOPHEN 5-325 MG PO TABS: 1.0000 | ORAL_TABLET | Freq: Four times a day (QID) | ORAL | Status: DC | PRN

## 2015-04-22 NOTE — Progress Notes (Signed)
Surgery Progress Note  S: Pain improved, no BM O: Blood pressure 117/79, pulse 73, temperature 98 F (36.7 C), temperature source Oral, resp. rate 18, height  (1.778 m), weight 171 lb 8 oz (77.792 kg), SpO2 98 %. GEN: NAD/A&Ox3 ABD: soft, min tender, nondistended, JP with serosang  A/P 63 yo s/p lap chole, doing well - miralax - ambulate - pain control

## 2015-04-23 LAB — SURGICAL PATHOLOGY

## 2015-04-23 NOTE — Discharge Summary (Signed)
Physician Discharge Summary  Patient ID: WILVER TIGNOR MRN: [id number] DOB/AGE: 63-Aug-1952 63 y.o.  Admit date: 04/18/2015 Discharge date: 04/23/2015   Discharge Diagnoses:  Active Problems:   Acute cholecystitis   Procedures:acute cholecystitis  Hospital Course: This patient was moved the hospital with acute cholecystitis. He was taken the operating room where laparoscopic cholecystectomy was performed and confirmed the presence of acute cholecystitis with empyema. He was treated with IV anabiotic's postoperatively and is discharged in stable condition after removal of his drain by Dr. Juliann Pulse. I'll up in our office in 10 days he is tolerating a regular diet.  Consults: none  Disposition: 01-Home or Self Care     Medication List    TAKE these medications        HYDROmorphone 2 MG tablet  Commonly known as:  DILAUDID  Take 1 tablet (2 mg total) by mouth every 4 (four) hours as needed for moderate pain or severe pain.     naproxen sodium 220 MG tablet  Commonly known as:  ANAPROX  Take 220-440 mg by mouth once as needed.     omeprazole 20 MG tablet  Commonly known as:  PRILOSEC OTC  Take 20 mg by mouth every morning.     oxyCODONE-acetaminophen 5-325 MG per tablet  Commonly known as:  PERCOCET/ROXICET  Take 1-2 tablets by mouth every 6 (six) hours as needed for moderate pain.     promethazine 25 MG tablet  Commonly known as:  PHENERGAN  Take 1 tablet (25 mg total) by mouth every 6 (six) hours as needed for nausea or vomiting.           Follow-up Information    Follow up with Dionne Milo, MD. Go on 05/05/2015.   Specialty:  Surgery   Why:  For wound re-check with Dr. Excell Seltzer 05/08/15 at 9:15am in the [facility] office.   Contact information:   [address] [place] [address] [phone]       Lattie Haw, MD, FACS

## 2015-05-02 ENCOUNTER — Other Ambulatory Visit: Payer: Self-pay | Admitting: *Deleted

## 2015-05-04 ENCOUNTER — Emergency Department
Admission: EM | Admit: 2015-05-04 | Discharge: 2015-05-05 | Disposition: A | Discharge status: Home / Self Care | Payer: BLUE CROSS/BLUE SHIELD | Attending: Emergency Medicine | Admitting: Emergency Medicine

## 2015-05-04 DIAGNOSIS — R1010 Upper abdominal pain, unspecified: Secondary | ICD-10-CM | POA: Diagnosis not present

## 2015-05-04 DIAGNOSIS — Z79899 Other long term (current) drug therapy: Secondary | ICD-10-CM | POA: Insufficient documentation

## 2015-05-04 DIAGNOSIS — Z87891 Personal history of nicotine dependence: Secondary | ICD-10-CM | POA: Diagnosis not present

## 2015-05-04 LAB — CBC
HCT: 45.1 % (ref 40.0–52.0)
Hemoglobin: 14.9 g/dL (ref 13.0–18.0)
MCH: 28.7 pg (ref 26.0–34.0)
MCHC: 33 g/dL (ref 32.0–36.0)
MCV: 86.8 fL (ref 80.0–100.0)
Platelets: 338 10*3/uL (ref 150–440)
RBC: 5.19 MIL/uL (ref 4.40–5.90)
RDW: 13.9 % (ref 11.5–14.5)
WBC: 8.4 10*3/uL (ref 3.8–10.6)

## 2015-05-04 LAB — URINALYSIS COMPLETE WITH MICROSCOPIC (ARMC ONLY)
Bilirubin Urine: NEGATIVE
Glucose, UA: NEGATIVE mg/dL
Hgb urine dipstick: NEGATIVE
Ketones, ur: NEGATIVE mg/dL
Leukocytes, UA: NEGATIVE
Nitrite: NEGATIVE
Protein, ur: NEGATIVE mg/dL
RBC / HPF: NONE SEEN RBC/hpf (ref 0–5)
Specific Gravity, Urine: 1.008 (ref 1.005–1.030)
pH: 6 (ref 5.0–8.0)

## 2015-05-04 LAB — COMPREHENSIVE METABOLIC PANEL
ALT: 63 U/L (ref 17–63)
AST: 109 U/L — ABNORMAL HIGH (ref 15–41)
Albumin: 3.9 g/dL (ref 3.5–5.0)
Alkaline Phosphatase: 151 U/L — ABNORMAL HIGH (ref 38–126)
Anion gap: 9 (ref 5–15)
BUN: 18 mg/dL (ref 6–20)
CO2: 26 mmol/L (ref 22–32)
Calcium: 9.4 mg/dL (ref 8.9–10.3)
Chloride: 104 mmol/L (ref 101–111)
Creatinine, Ser: 1.67 mg/dL — ABNORMAL HIGH (ref 0.61–1.24)
GFR calc Af Amer: 49 mL/min — ABNORMAL LOW (ref >60)
GFR calc non Af Amer: 42 mL/min — ABNORMAL LOW (ref >60)
Glucose, Bld: 157 mg/dL — ABNORMAL HIGH (ref 65–99)
Potassium: 4.6 mmol/L (ref 3.5–5.1)
Sodium: 139 mmol/L (ref 135–145)
Total Bilirubin: 1 mg/dL (ref 0.3–1.2)
Total Protein: 7.3 g/dL (ref 6.5–8.1)

## 2015-05-04 LAB — LIPASE, BLOOD: Lipase: 41 U/L (ref 22–51)

## 2015-05-04 NOTE — ED Notes (Signed)
Pt has abd pain.  Sudden onset tonight while showering.   No n/v/d.  Pt had gallbladder removed 2 weeks ago.  States no problems since surgery until tonight.    Pt has appt with dr cooper in the morning.

## 2015-05-04 NOTE — ED Notes (Signed)
Patient reports had gallbladder out approximately a week ago and was doing fine.  Tonight had sudden sharpe pain underneath his ribs mid gastric.

## 2015-05-05 ENCOUNTER — Ambulatory Visit (INDEPENDENT_AMBULATORY_CARE_PROVIDER_SITE_OTHER): Payer: BLUE CROSS/BLUE SHIELD | Admitting: Surgery

## 2015-05-05 ENCOUNTER — Encounter: Payer: Self-pay | Admitting: Surgery

## 2015-05-05 ENCOUNTER — Emergency Department: Payer: BLUE CROSS/BLUE SHIELD

## 2015-05-05 VITALS — BP 120/77 | HR 64 | Temp 97.7°F | Ht 64.0 in | Wt 179.0 lb

## 2015-05-05 DIAGNOSIS — K810 Acute cholecystitis: Secondary | ICD-10-CM

## 2015-05-05 MED ORDER — MORPHINE SULFATE (PF) 4 MG/ML IV SOLN
4.0000 mg | Freq: Once | INTRAVENOUS | Status: AC
  Administered 2015-05-05: 4 mg via INTRAVENOUS
  Filled 2015-05-05: qty 1

## 2015-05-05 MED ORDER — MORPHINE SULFATE (PF) 4 MG/ML IV SOLN
INTRAVENOUS | Status: AC
  Filled 2015-05-05: qty 1

## 2015-05-05 MED ORDER — DIAZEPAM 5 MG PO TABS: 5.0000 mg | ORAL_TABLET | Freq: Three times a day (TID) | ORAL | Status: AC | PRN

## 2015-05-05 MED ORDER — IOHEXOL 300 MG/ML  SOLN
80.0000 mL | Freq: Once | INTRAMUSCULAR | Status: AC | PRN
  Administered 2015-05-05: 80 mL via INTRAVENOUS

## 2015-05-05 MED ORDER — ONDANSETRON HCL 4 MG/2ML IJ SOLN
4.0000 mg | Freq: Once | INTRAMUSCULAR | Status: AC
  Administered 2015-05-05: 4 mg via INTRAVENOUS
  Filled 2015-05-05: qty 2

## 2015-05-05 MED ORDER — MORPHINE SULFATE (PF) 4 MG/ML IV SOLN
4.0000 mg | Freq: Once | INTRAVENOUS | Status: AC
  Administered 2015-05-05: 4 mg via INTRAVENOUS

## 2015-05-05 MED ORDER — IOHEXOL 240 MG/ML SOLN
25.0000 mL | INTRAMUSCULAR | Status: AC
  Administered 2015-05-05: 25 mL via ORAL

## 2015-05-05 MED ORDER — DIAZEPAM 5 MG PO TABS
5.0000 mg | ORAL_TABLET | Freq: Once | ORAL | Status: AC
  Administered 2015-05-05: 5 mg via ORAL
  Filled 2015-05-05: qty 1

## 2015-05-05 NOTE — Patient Instructions (Addendum)
We will recheck your lab work on Friday. Then I will see you in two weeks.  No heavy lifting for two more weeks.

## 2015-05-05 NOTE — ED Provider Notes (Signed)
[facility] Emergency Department Provider Note  ____________________________________________  Time seen: Approximately 2348 PM  I have reviewed the triage vital signs and the nursing notes.   HISTORY  Chief Complaint Abdominal Pain    HPI Anthony [name] is a 64 y.o. male who had a cholecystectomy done 2 weeks ago by Dr. Excell Seltzer. The patient reports that tonight he was getting out of the shower and getting dressed when he developed severe pain under his ribs bilaterally. The patient reports that had him on the floor screaming in pain which she reported that time was a 10 out of 10 in intensity. The patient reports this pain is currently a 7-8 out of 10 in intensity. The patient reports that he ran out of Percocet this morning and has some Dilaudid at home from a kidney stone in the past but reports that he had not taken it for pain prior to coming back in. The patient reports that when he had a surgery had a very disease gallbladder that was very infected. The patient's cholecystectomy was done laparoscopically. He had a drain put in and the drain was removed prior to discharge. The patient denies any fever or vomiting. He reports that he has had some pain but has not been as bad as current. The patient also denies any diarrhea.   Past Medical History  Diagnosis Date  . GERD (gastroesophageal reflux disease)   . Arthritis   . H/O mitral valve prolapse 2008 per echo    asymptomatic and does not see cardiologist.   . Hematuria   . Renal calculi     left  . Bilateral ureteral calculi   . History of kidney stones   . Renal insufficiency   . DDD (degenerative disc disease), lumbosacral   . Chronic back pain   . 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 Acute cholecystitis 04/19/2015  . Cholecystitis   . RUQ abdominal pain   . Right ureteral calculus 02/05/2015   . Left ureteral calculus 02/05/2015  . Renal insufficiency 02/05/2015  . Ureteral stone 02/04/2015    Past Surgical History  Procedure Laterality Date  . Biceps tendon repair Right 2004 approx  . Cystoscopy with retrograde pyelogram, ureteroscopy and stent placement Right 01/24/2014    Procedure: CYSTOSCOPY WITH RETROGRADE PYELOGRAM, URETEROSCOPY AND STENT PLACEMENT, holmium laser, stone extraction;  Surgeon: Danae Chen, MD;  Location: [facility];  Service: Urology;  Laterality: Right;  . Cystoscopy with retrograde pyelogram, ureteroscopy and stent placement Right 02/07/2014    Procedure: RIGHT URETEROSCOPY, STONE EXTRACTION AND POSSIBLE STENT PLACEMENT;  Surgeon: Anner Crete, MD;  Location: [facility];  Service: Urology;  Laterality: Right;  . Holmium laser application Right 02/07/2014    Procedure: HOLMIUM LASER APPLICATION;  Surgeon: Anner Crete, MD;  Location: [facility];  Service: Urology;  Laterality: Right;  . Cystoscopy with retrograde pyelogram, ureteroscopy and stent placement Bilateral 02/04/2015    Procedure: CYSTOSCOPY WITH RETROGRADE PYELOGRAM, URETEROSCOPY AND STENT PLACEMENT, BILATERAL;  Surgeon: Jethro Bolus, MD;  Location: [facility];  Service: Urology;  Laterality: Bilateral;  . Extracorporeal shock wave lithotripsy  Left 02-13-2015//  Right 01-17-2014  . Cysto/  right retrograde pyelogram/ ureteroscopy attempted stone manipulation/  right stent placement  03-03-2009  . Right ureteroscopic stone extraction  03-13-2009  . Left ureteroscopic  laser lithotripsy stone extraction w/ stent placement  03-09-2011;   09-18-2009  . Left ureteroscopic stone extraction w/ stent  09-25-2009  . Posterior fusion lumbar spine  09-08-2010    Re-do Laminectomy L3-S1 w/  Decompression and fusion   . Lumbar laminectomy  x2  ----  L3 - L5//  L5 -- S1  . Anterior cervical decomp/discectomy fusion  C5--C7  06-05-2002//  C7--T1   03-29-2006  .  Transthoracic echocardiogram  07-04-2007    Mild focal basal septal hypertrophy/  ef 60%/  mild MVP involving anterior and posterior leaflets w/ trivial MR/  trivial pericardial effusion posterior to the heart  . Cystoscopy with ureteroscopy, stone basketry and stent placement Bilateral 02/20/2015    Procedure: CYSTOSCOPY WITH BILATERAL URETEROSCOPY, STONE EXTRACTION WITH LASER  AND STENT PLACEMENT;  Surgeon: Bjorn Pippin, MD;  Location: [facility];  Service: Urology;  Laterality: Bilateral;  . Holmium laser application N/A 02/20/2015    Procedure: HOLMIUM LASER APPLICATION;  Surgeon: Bjorn Pippin, MD;  Location: [facility];  Service: Urology;  Laterality: N/A;  . Laparoscopic cholecystectomy single port N/A 04/19/2015    Procedure: LAPAROSCOPIC CHOLECYSTECTOMY;  Surgeon: Lattie Haw, MD;  Location: [facility];  Service: General;  Laterality: N/A;    Current Outpatient Rx  Name  Route  Sig  Dispense  Refill  . diazepam (VALIUM) 5 MG tablet   Oral   Take 1 tablet (5 mg total) by mouth every 8 (eight) hours as needed for anxiety.   12 tablet   0   . HYDROmorphone (DILAUDID) 2 MG tablet   Oral   Take 1 tablet (2 mg total) by mouth every 4 (four) hours as needed for moderate pain or severe pain.   30 tablet   0   . naproxen sodium (ANAPROX) 220 MG tablet   Oral   Take 220-440 mg by mouth once as needed.         Marland Kitchen omeprazole (PRILOSEC OTC) 20 MG tablet   Oral   Take 20 mg by mouth every morning.          Marland Kitchen oxyCODONE-acetaminophen (PERCOCET/ROXICET) 5-325 MG per tablet   Oral   Take 1-2 tablets by mouth every 6 (six) hours as needed for moderate pain.   30 tablet   0   . promethazine (PHENERGAN) 25 MG tablet   Oral   Take 1 tablet (25 mg total) by mouth every 6 (six) hours as needed for nausea or vomiting.   20 tablet   1   . tamsulosin (FLOMAX) 0.4 MG CAPS capsule   Oral   Take 0.4 mg by mouth daily.      0      Allergies Codeine  Family History  Problem Relation Age of Onset  . Cancer Mother   . Diabetes Mother   . Hypertension Mother   . Hypertension Brother   . Stroke Mother   . Stroke Father   . Thyroid disease Daughter     Social History Social History  Substance Use Topics  . Smoking status: Former Smoker    Types: Pipe    Quit date: 02/17/2000  . Smokeless tobacco: Never Used  . Alcohol Use: 0.6 oz/week    1 Shots of liquor per week     Comment: occasionally     Review of Systems Constitutional: No fever/chills Eyes: No visual changes. ENT: No sore throat. Cardiovascular: Denies chest pain. Respiratory: Denies shortness of breath. Gastrointestinal: abdominal pain.  No nausea, no vomiting.  No diarrhea.  No constipation. Genitourinary: Negative for dysuria. Musculoskeletal: Negative for back pain. Skin: Negative for rash. Neurological: Negative for headaches, focal weakness or numbness.  10-point ROS otherwise negative.  ____________________________________________   PHYSICAL EXAM:  VITAL SIGNS: ED Triage Vitals  Enc Vitals Group     BP 05/04/15 2217 102/71 mmHg     Pulse Rate 05/04/15 2217 103     Resp 05/04/15 2217 22     Temp --      Temp src --      SpO2 05/04/15 2217 94 %     Weight 05/04/15 2217 178 lb (80.74 kg)     Height 05/04/15 2217 5' 10.5" (1.791 m)     Head Cir --      Peak Flow --      Pain Score 05/04/15 2217 8     Pain Loc --      Pain Edu? --      Excl. in GC? --     Constitutional: Alert and oriented. Well appearing and in moderate distress. Eyes: Conjunctivae are normal. PERRL. EOMI. Head: Atraumatic. Nose: No congestion/rhinnorhea. Mouth/Throat: Mucous membranes are moist.  Oropharynx non-erythematous. Cardiovascular: Normal rate, regular rhythm. Grossly normal heart sounds.  Good peripheral circulation. Respiratory: Normal respiratory effort.  No retractions. Lungs CTAB. Gastrointestinal: Soft and nontender. No  distention. Positive bowel sounds Musculoskeletal: No lower extremity tenderness nor edema.   Neurologic:  Normal speech and language.  Skin:  Skin is warm, dry and intact.  Psychiatric: Mood and affect are normal.   ____________________________________________   LABS (all labs ordered are listed, but only abnormal results are displayed)  Labs Reviewed  COMPREHENSIVE METABOLIC PANEL - Abnormal; Notable for the following:    Glucose, Bld 157 (*)    Creatinine, Ser 1.67 (*)    AST 109 (*)    Alkaline Phosphatase 151 (*)    GFR calc non Af Amer 42 (*)    GFR calc Af Amer 49 (*)    All other components within normal limits  URINALYSIS COMPLETEWITH MICROSCOPIC ([facility] ONLY) - Abnormal; Notable for the following:    Color, Urine YELLOW (*)    APPearance CLOUDY (*)    Bacteria, UA RARE (*)    Squamous Epithelial / LPF 0-5 (*)    All other components within normal limits  LIPASE, BLOOD  CBC   ____________________________________________  EKG  ED ECG REPORT I, Rebecka Apley, the attending physician, personally viewed and interpreted this ECG.   Date: 05/04/2015  EKG Time: 2232  Rate: 92  Rhythm: normal EKG, normal sinus rhythm  Axis: normal  Intervals:none  ST&T Change: none  ____________________________________________  RADIOLOGY  CT abd and pelvis: Post cholecystectomy. Minimal fluid in the cholecystectomy bed without surrounding enhancement or focal fluid collection.. Likely normal finding postcholecystectomy, Intra and extrahepatic biliary prominence a common finding postcholecystectomy  ____________________________________________   PROCEDURES  Procedure(s) performed: None  Critical Care performed: No  ____________________________________________   INITIAL IMPRESSION / ASSESSMENT AND PLAN / ED COURSE  Pertinent labs & imaging results that were available during my care of the patient were reviewed by me and considered in my medical decision making (see  chart for details).  This is a 64 year old man who comes in with some increasing abdominal pain status post cholecystectomy. The patient did have a laparoscopic surgery done at the time but there is always a possibility of perforation. I will do an upright x-ray evaluating for perforated viscus  and then a CT scan to determine the possible cause of this pain.  I discussed the results with the patient and he reports that he has an appointment in the morning with surgery. ____________________________________________   FINAL CLINICAL IMPRESSION(S) / ED DIAGNOSES  Final diagnoses:  Pain of upper abdomen      Rebecka Apley, MD 05/05/15 [phone]

## 2015-05-05 NOTE — ED Notes (Signed)
Patient transported to CT 

## 2015-05-05 NOTE — ED Notes (Signed)
Pain in upper abd again.   md aware.  More pain meds given

## 2015-05-05 NOTE — ED Notes (Signed)
Pt drinking po contrast.  States feeling better.

## 2015-05-05 NOTE — Discharge Instructions (Signed)

## 2015-05-05 NOTE — Progress Notes (Signed)
Outpatient postop visit  05/05/2015  Anthony [name] is an 64 y.o. male.    Procedure:  Laparoscopic cholecystectomy  CC  No pain this morning just arrived from ER  HPI:  This patient who had an urgent laparoscopic cholecystectomy for empyema of the gallbladder. He was in the emergency room  after dischargefor abdominal pain with a negative workup. He had a drain that had been placed and was removed prior to his discharge.  he denies fevers or chills no jaundice or acholic stools points to both diaphragm areas bilaterally as the area of his pain last night. Medications reviewed.    Physical Exam:  BP 120/77 mmHg  Pulse 64  Temp(Src) 97.7 F (36.5 C) (Oral)  Ht  (1.626 m)  Wt 179 lb (81.194 kg)  BMI 30.71 kg/m2    PE:  Soft nontender abdomen wounds healing well no erythema no drainage no jaundice no icterus.    Assessment/Plan:   laboratory and CT findings were personally reviewed. Will recheck LFTs at the end of the week.  While this patient had an empyema of the gallbladder and much of this can be explained by that elevated LFTs may suggest a retained stone. I will see him back in 2 weeks or sooner if his LFTs remain elevated. Lattie Haw, MD, FACS

## 2015-05-20 ENCOUNTER — Other Ambulatory Visit
Admission: RE | Admit: 2015-05-20 | Discharge: 2015-05-20 | Disposition: A | Discharge status: Home / Self Care | Payer: BLUE CROSS/BLUE SHIELD | Source: Ambulatory Visit | Attending: Surgery | Admitting: Surgery

## 2015-05-20 ENCOUNTER — Encounter: Payer: Self-pay | Admitting: Surgery

## 2015-05-20 ENCOUNTER — Ambulatory Visit (INDEPENDENT_AMBULATORY_CARE_PROVIDER_SITE_OTHER): Payer: BLUE CROSS/BLUE SHIELD | Admitting: Surgery

## 2015-05-20 ENCOUNTER — Telehealth: Payer: Self-pay

## 2015-05-20 VITALS — BP 135/74 | HR 85 | Temp 97.8°F | Ht 71.0 in | Wt 171.6 lb

## 2015-05-20 DIAGNOSIS — K810 Acute cholecystitis: Secondary | ICD-10-CM

## 2015-05-20 LAB — COMPREHENSIVE METABOLIC PANEL
ALT: 109 U/L — ABNORMAL HIGH (ref 17–63)
AST: 92 U/L — ABNORMAL HIGH (ref 15–41)
Albumin: 3.7 g/dL (ref 3.5–5.0)
Alkaline Phosphatase: 213 U/L — ABNORMAL HIGH (ref 38–126)
Anion gap: 8 (ref 5–15)
BUN: 15 mg/dL (ref 6–20)
CO2: 25 mmol/L (ref 22–32)
Calcium: 9 mg/dL (ref 8.9–10.3)
Chloride: 105 mmol/L (ref 101–111)
Creatinine, Ser: 1.47 mg/dL — ABNORMAL HIGH (ref 0.61–1.24)
GFR calc Af Amer: 57 mL/min — ABNORMAL LOW (ref >60)
GFR calc non Af Amer: 49 mL/min — ABNORMAL LOW (ref >60)
Glucose, Bld: 132 mg/dL — ABNORMAL HIGH (ref 65–99)
Potassium: 3.5 mmol/L (ref 3.5–5.1)
Sodium: 138 mmol/L (ref 135–145)
Total Bilirubin: 0.8 mg/dL (ref 0.3–1.2)
Total Protein: 6.7 g/dL (ref 6.5–8.1)

## 2015-05-20 NOTE — Telephone Encounter (Signed)
Called Anthony [name] to ask him if he had gone to the lab and had his blood work drawn. Patient stated that he didn't remember anything about him going to the lab for blood work. I told him that he needed to go to the [facility] and have this done before he comes to the office this afternoon. Patient understood. Lab orders were faxed to the registration desk this morning.

## 2015-05-20 NOTE — Progress Notes (Signed)
Outpatient Surgical Follow Up  05/20/2015  Anthony [name] is an 64 y.o. male.   CC: Right upper quadrant pain  HPI: This patient status post laparoscopic cholecystectomy for acute cholecystitis with empyema of the gallbladder. When last seen in the office he had been to the emergency room with slightly elevated liver function tests. 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  Past Medical History  Diagnosis Date  . GERD (gastroesophageal reflux disease)   . Arthritis   . H/O mitral valve prolapse 2008 per echo    asymptomatic and does not see cardiologist.   . Hematuria   . Renal calculi     left  . Bilateral ureteral calculi   . History of kidney stones   . Renal insufficiency   . DDD (degenerative disc disease), lumbosacral   . Chronic back pain   . 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 Biceps tendon repair Right 2004 approx  . Cystoscopy with retrograde pyelogram, ureteroscopy and stent placement Right 01/24/2014    Procedure: CYSTOSCOPY WITH RETROGRADE PYELOGRAM, URETEROSCOPY AND STENT PLACEMENT, holmium laser, stone extraction;  Surgeon: Arvil Persons, MD;  Location: [facility];  Service: Urology;  Laterality: Right;  . Cystoscopy with retrograde pyelogram, ureteroscopy and stent placement Right 02/07/2014    Procedure: RIGHT URETEROSCOPY, STONE EXTRACTION AND POSSIBLE STENT PLACEMENT;  Surgeon: Malka So, MD;  Location: [facility];  Service: Urology;  Laterality: Right;  . Holmium laser application Right 6/38/7564    Procedure: HOLMIUM LASER APPLICATION;  Surgeon: Malka So, MD;   Location: [facility];  Service: Urology;  Laterality: Right;  . Cystoscopy with retrograde pyelogram, ureteroscopy and stent placement Bilateral 02/04/2015    Procedure: CYSTOSCOPY WITH RETROGRADE PYELOGRAM, URETEROSCOPY AND STENT PLACEMENT, BILATERAL;  Surgeon: Carolan Clines, MD;  Location: [facility];  Service: Urology;  Laterality: Bilateral;  . Extracorporeal shock wave lithotripsy  Left 02-13-2015//  Right 01-17-2014  . Cysto/  right retrograde pyelogram/ ureteroscopy attempted stone manipulation/  right stent placement  03-03-2009  . Right ureteroscopic stone extraction  03-13-2009  . Left ureteroscopic laser lithotripsy stone extraction w/ stent placement  03-09-2011;   09-18-2009  . Left ureteroscopic stone extraction w/ stent  09-25-2009  . Posterior fusion lumbar spine  09-08-2010    Re-do Laminectomy L3-S1 w/  Decompression and fusion   . Lumbar laminectomy  x2  ----  L3 - L5//  L5 -- S1  . Anterior cervical decomp/discectomy fusion  C5--C7  06-05-2002//  C7--T1   03-29-2006  . Transthoracic echocardiogram  07-04-2007    Mild focal basal septal hypertrophy/  ef 60%/  mild MVP involving anterior and posterior leaflets w/ trivial MR/  trivial pericardial effusion posterior to the heart  . Cystoscopy with ureteroscopy, stone basketry and stent placement Bilateral 02/20/2015    Procedure: CYSTOSCOPY WITH BILATERAL URETEROSCOPY, STONE EXTRACTION WITH LASER  AND STENT PLACEMENT;  Surgeon: Irine Seal, MD;  Location: [facility];  Service: Urology;  Laterality: Bilateral;  . Holmium laser application N/A 3/32/9518    Procedure: HOLMIUM  LASER APPLICATION;  Surgeon: Irine Seal, MD;  Location: [facility];  Service: Urology;  Laterality: N/A;  . Laparoscopic cholecystectomy single port N/A 04/19/2015    Procedure: LAPAROSCOPIC CHOLECYSTECTOMY;  Surgeon: Florene Glen, MD;  Location: [facility];  Service: General;  Laterality: N/A;    Family History   Problem Relation Age of Onset  . Cancer Mother   . Diabetes Mother   . Hypertension Mother   . Stroke Mother   . Colon cancer Mother   . Hypertension Brother   . Colon cancer Brother     Pre-cancerous Polyps removed  . Kidney disease Brother     Chronic- secondary to Hypertension  . Stroke Father   . Thyroid disease Daughter   . Colon cancer Maternal Aunt     x 2  . Colon cancer Maternal Uncle     Social History:  reports that he quit smoking about 15 years ago. His smoking use included Pipe. He has never used smokeless tobacco. He reports that he drinks about 0.6 oz of alcohol per week. He reports that he does not use illicit drugs.  Allergies:  Allergies  Allergen Reactions  . Codeine Itching    Medications reviewed.   Review of Systems:   Review of Systems  Constitutional: Negative for fever and chills.  HENT: Negative.   Eyes: Negative.   Respiratory: Negative for cough.   Cardiovascular: Negative for chest pain and palpitations.  Gastrointestinal: Positive for abdominal pain. Negative for heartburn, nausea, vomiting, diarrhea, constipation, blood in stool and melena.     Physical Exam:  BP 135/74 mmHg  Pulse 85  Temp(Src) 97.8 F (36.6 C) (Oral)  Ht _0  (1.803 m)  Wt 171 lb 9.6 oz (77.837 kg)  BMI 23.94 kg/m2  Physical Exam  Eyes: No scleral icterus.  Abdominal: He exhibits no distension. There is no tenderness. There is no rebound and no guarding.  Skin: Skin is warm and dry. No erythema.  No jaundice      Results for orders placed or performed during the hospital encounter of 05/20/15 (from the past 48 hour(s))  Comprehensive metabolic panel     Status: Abnormal   Collection Time: 05/20/15  2:26 PM  Result Value Ref Range   Sodium 138 135 - 145 mmol/L   Potassium 3.5 3.5 - 5.1 mmol/L   Chloride 105 101 - 111 mmol/L   CO2 25 22 - 32 mmol/L   Glucose, Bld 132 (H) 65 - 99 mg/dL   BUN 15 6 - 20 mg/dL   Creatinine, Ser 1.47 (H) 0.61 - 1.24  mg/dL   Calcium 9.0 8.9 - 10.3 mg/dL   Total Protein 6.7 6.5 - 8.1 g/dL   Albumin 3.7 3.5 - 5.0 g/dL   AST 92 (H) 15 - 41 U/L   ALT 109 (H) 17 - 63 U/L   Alkaline Phosphatase 213 (H) 38 - 126 U/L   Total Bilirubin 0.8 0.3 - 1.2 mg/dL   GFR calc non Af Amer 49 (L) >60 mL/min   GFR calc Af Amer 57 (L) >60 mL/min    Comment: (NOTE) The eGFR has been calculated using the CKD EPI equation. This calculation has not been validated in all clinical situations. eGFR's persistently <60 mL/min signify possible Chronic Kidney Disease.    Anion gap 8 5 - 15   No results found.  Assessment/Plan:  Discussed with the patient and his wife the likelihood that this could be a possible retained stone however  with the short duration of the attacks and no jaundice no fevers no icterus no dark urine etc. it is unlikely that this is a retained stone especially since he had an empyema of the gallbladder with an obstructed cystic duct. He had labs drawn but not until this afternoon and they were not available at the time of the visit.  As the patient was leaving his labs became available and I notified him of LFTs being normal.  Subsequent to that it became apparent that I had been shown the wrong date of labs and the labs that did come back later showed that he had slightly elevated liver function tests suggesting the possibility of a retained stone or an inflammatory process from his empyema of the gallbladder.  I called him on his cell phone and discussed the findings of abnormal liver function tests all be it slightly elevated. I suggested that because her only slightly elevated and that his symptoms were not necessarily consistent with retained stone that we could repeat his labs early in the week next week and have him follow-up in my office at the end of next week for discussion concerning possible ERCP if necessary in agreement with this plan.  Florene Glen, MD, FACS

## 2015-05-20 NOTE — Patient Instructions (Addendum)
As we discussed on the telephone after you left the office we will see you in the office at the end of next week and recheck her liver function tests on Monday or Tuesday  We will set you up for a Colonoscopy with Dr. Servando Snare. One of our triage nurses will call you to go over your history and medications, then we will get you scheduled.  If you have any questions, please call our office.  Please call our office with an update next week, if you are feeling no better we will see you back in the office.

## 2015-05-26 ENCOUNTER — Other Ambulatory Visit
Admission: RE | Admit: 2015-05-26 | Discharge: 2015-05-26 | Disposition: A | Discharge status: Home / Self Care | Payer: BLUE CROSS/BLUE SHIELD | Source: Ambulatory Visit | Attending: Surgery | Admitting: Surgery

## 2015-05-26 DIAGNOSIS — K810 Acute cholecystitis: Secondary | ICD-10-CM | POA: Insufficient documentation

## 2015-05-26 LAB — COMPREHENSIVE METABOLIC PANEL
ALT: 70 U/L — ABNORMAL HIGH (ref 17–63)
AST: 53 U/L — ABNORMAL HIGH (ref 15–41)
Albumin: 3.7 g/dL (ref 3.5–5.0)
Alkaline Phosphatase: 205 U/L — ABNORMAL HIGH (ref 38–126)
Anion gap: 8 (ref 5–15)
BUN: 16 mg/dL (ref 6–20)
CO2: 25 mmol/L (ref 22–32)
Calcium: 9.2 mg/dL (ref 8.9–10.3)
Chloride: 108 mmol/L (ref 101–111)
Creatinine, Ser: 1.4 mg/dL — ABNORMAL HIGH (ref 0.61–1.24)
GFR calc Af Amer: >60 mL/min (ref >60)
GFR calc non Af Amer: 52 mL/min — ABNORMAL LOW (ref >60)
Glucose, Bld: 99 mg/dL (ref 65–99)
Potassium: 3.9 mmol/L (ref 3.5–5.1)
Sodium: 141 mmol/L (ref 135–145)
Total Bilirubin: 0.8 mg/dL (ref 0.3–1.2)
Total Protein: 6.7 g/dL (ref 6.5–8.1)

## 2015-05-26 LAB — CBC WITH DIFFERENTIAL/PLATELET
Basophils Absolute: 0.1 10*3/uL (ref 0–0.1)
Basophils Relative: 1 %
Eosinophils Absolute: 0.1 10*3/uL (ref 0–0.7)
Eosinophils Relative: 2 %
HCT: 45.6 % (ref 40.0–52.0)
Hemoglobin: 15 g/dL (ref 13.0–18.0)
Lymphocytes Relative: 18 %
Lymphs Abs: 1.2 10*3/uL (ref 1.0–3.6)
MCH: 28.2 pg (ref 26.0–34.0)
MCHC: 32.9 g/dL (ref 32.0–36.0)
MCV: 85.9 fL (ref 80.0–100.0)
Monocytes Absolute: 0.7 10*3/uL (ref 0.2–1.0)
Monocytes Relative: 10 %
Neutro Abs: 4.7 10*3/uL (ref 1.4–6.5)
Neutrophils Relative %: 69 %
Platelets: 163 10*3/uL (ref 150–440)
RBC: 5.31 MIL/uL (ref 4.40–5.90)
RDW: 14.4 % (ref 11.5–14.5)
WBC: 6.7 10*3/uL (ref 3.8–10.6)

## 2015-05-28 ENCOUNTER — Ambulatory Visit: Payer: BLUE CROSS/BLUE SHIELD | Admitting: Surgery

## 2015-05-29 ENCOUNTER — Telehealth: Payer: Self-pay

## 2015-05-29 ENCOUNTER — Encounter: Payer: Self-pay | Admitting: Surgery

## 2015-05-29 ENCOUNTER — Ambulatory Visit (INDEPENDENT_AMBULATORY_CARE_PROVIDER_SITE_OTHER): Payer: BLUE CROSS/BLUE SHIELD | Admitting: Surgery

## 2015-05-29 ENCOUNTER — Encounter (INDEPENDENT_AMBULATORY_CARE_PROVIDER_SITE_OTHER): Payer: Self-pay

## 2015-05-29 ENCOUNTER — Other Ambulatory Visit: Payer: Self-pay

## 2015-05-29 VITALS — BP 141/86 | HR 69 | Temp 97.8°F | Ht 71.0 in | Wt 170.0 lb

## 2015-05-29 DIAGNOSIS — K810 Acute cholecystitis: Secondary | ICD-10-CM

## 2015-05-29 MED ORDER — NA SULFATE-K SULFATE-MG SULF 17.5-3.13-1.6 GM/177ML PO SOLN: 1.0000 | ORAL | Status: DC

## 2015-05-29 NOTE — Telephone Encounter (Signed)
FYI.Marland KitchenMarland Kitchenpt is scheduled for his colonoscopy at [facility] on 06/27/15. Thank you for going over instructions and sending rx.

## 2015-05-29 NOTE — Addendum Note (Signed)
Addended by: Adela Ports on: 05/29/2015 09:50 AM   Modules accepted: Orders

## 2015-05-29 NOTE — Progress Notes (Signed)
Outpatient postop visit  05/29/2015  Anthony [name] is an 64 y.o. male.    Procedure laparoscopic cholecystectomy  CC: Gas  HPI: This is a patient who has had a laparoscopic cholecystectomy for acute cholecystitis with empyema of the gallbladder. Postoperatively he had slightly elevated liver function tests which have improved but not completely normalized. He presents again with abdominal pain in the epigastrium that last only a few minutes and goes away when he takes chewable Alka-Seltzer and burps once he burps and the gas relieves his pain completely goes away he denies fever chills jaundice or acholic stools and no back pain with this.  Medications reviewed.    Physical Exam:  BP 141/86 mmHg  Pulse 69  Temp(Src) 97.8 F (36.6 C) (Oral)  Ht  (1.803 m)  Wt 170 lb (77.111 kg)  BMI 23.72 kg/m2    PE: No scleral icterus no jaundice abdomen soft nontender scars are well-healed.    Assessment/Plan:  Concerned about retained common bile duct stone however with his liver function tests continuously improving which are been personally reviewed, and the fact that his pain is atypical and short-lived for retained stone I would suggest that we observe this only. I am arranging for him to see Dr. Servando Snare in the near future for colonoscopy and should he ever worsen I would recheck labs and obtain an ERCP. We discussed MRCP but I do not believe that I would be very helpful in this patient. Agree with this plan  Lattie Haw, MD, FACS

## 2015-05-29 NOTE — Telephone Encounter (Signed)
Can you please schedule a colonoscopy at [facility] for Anthony [name]. He has family history of colon cancer and he has never had one done. I was able to give him his instructions and I also sent his prescription to his pharmacy. Thank you for your help.

## 2015-05-29 NOTE — Telephone Encounter (Signed)
Please call patient once you have an appointment available. Thank you.

## 2015-06-23 ENCOUNTER — Encounter: Payer: Self-pay | Admitting: *Deleted

## 2015-06-26 NOTE — Discharge Instructions (Signed)

## 2015-06-27 ENCOUNTER — Ambulatory Visit
Admission: RE | Admit: 2015-06-27 | Discharge: 2015-06-27 | Disposition: A | Discharge status: Home / Self Care | Payer: BLUE CROSS/BLUE SHIELD | Source: Ambulatory Visit | Attending: Gastroenterology | Admitting: Gastroenterology

## 2015-06-27 ENCOUNTER — Encounter
Admission: RE | Disposition: A | Discharge status: Home / Self Care | Payer: Self-pay | Source: Ambulatory Visit | Attending: Gastroenterology

## 2015-06-27 ENCOUNTER — Ambulatory Visit: Payer: BLUE CROSS/BLUE SHIELD | Admitting: Anesthesiology

## 2015-06-27 DIAGNOSIS — I341 Nonrheumatic mitral (valve) prolapse: Secondary | ICD-10-CM | POA: Diagnosis not present

## 2015-06-27 DIAGNOSIS — K219 Gastro-esophageal reflux disease without esophagitis: Secondary | ICD-10-CM | POA: Diagnosis not present

## 2015-06-27 DIAGNOSIS — G8929 Other chronic pain: Secondary | ICD-10-CM | POA: Insufficient documentation

## 2015-06-27 DIAGNOSIS — Z823 Family history of stroke: Secondary | ICD-10-CM | POA: Diagnosis not present

## 2015-06-27 DIAGNOSIS — N289 Disorder of kidney and ureter, unspecified: Secondary | ICD-10-CM | POA: Insufficient documentation

## 2015-06-27 DIAGNOSIS — K641 Second degree hemorrhoids: Secondary | ICD-10-CM | POA: Diagnosis not present

## 2015-06-27 DIAGNOSIS — Z79899 Other long term (current) drug therapy: Secondary | ICD-10-CM | POA: Insufficient documentation

## 2015-06-27 DIAGNOSIS — Z885 Allergy status to narcotic agent status: Secondary | ICD-10-CM | POA: Insufficient documentation

## 2015-06-27 DIAGNOSIS — Z800 Family history of malignant neoplasm of digestive organs: Secondary | ICD-10-CM | POA: Insufficient documentation

## 2015-06-27 DIAGNOSIS — K5730 Diverticulosis of large intestine without perforation or abscess without bleeding: Secondary | ICD-10-CM | POA: Diagnosis not present

## 2015-06-27 DIAGNOSIS — Z8249 Family history of ischemic heart disease and other diseases of the circulatory system: Secondary | ICD-10-CM | POA: Insufficient documentation

## 2015-06-27 DIAGNOSIS — Z87442 Personal history of urinary calculi: Secondary | ICD-10-CM | POA: Insufficient documentation

## 2015-06-27 DIAGNOSIS — Z841 Family history of disorders of kidney and ureter: Secondary | ICD-10-CM | POA: Diagnosis not present

## 2015-06-27 DIAGNOSIS — Z1211 Encounter for screening for malignant neoplasm of colon: Secondary | ICD-10-CM | POA: Diagnosis not present

## 2015-06-27 DIAGNOSIS — Z833 Family history of diabetes mellitus: Secondary | ICD-10-CM | POA: Insufficient documentation

## 2015-06-27 DIAGNOSIS — Z87891 Personal history of nicotine dependence: Secondary | ICD-10-CM | POA: Insufficient documentation

## 2015-06-27 DIAGNOSIS — R200 Anesthesia of skin: Secondary | ICD-10-CM | POA: Insufficient documentation

## 2015-06-27 DIAGNOSIS — M549 Dorsalgia, unspecified: Secondary | ICD-10-CM | POA: Insufficient documentation

## 2015-06-27 DIAGNOSIS — Z9049 Acquired absence of other specified parts of digestive tract: Secondary | ICD-10-CM | POA: Insufficient documentation

## 2015-06-27 DIAGNOSIS — M1990 Unspecified osteoarthritis, unspecified site: Secondary | ICD-10-CM | POA: Insufficient documentation

## 2015-06-27 DIAGNOSIS — M5137 Other intervertebral disc degeneration, lumbosacral region: Secondary | ICD-10-CM | POA: Insufficient documentation

## 2015-06-27 HISTORY — PX: COLONOSCOPY WITH PROPOFOL: SHX5780

## 2015-06-27 SURGERY — COLONOSCOPY WITH PROPOFOL
Anesthesia: Monitor Anesthesia Care | Wound class: Contaminated

## 2015-06-27 MED ORDER — LACTATED RINGERS IV SOLN
INTRAVENOUS | Status: DC
  Administered 2015-06-27: 10:00:00 via INTRAVENOUS

## 2015-06-27 MED ORDER — PROMETHAZINE HCL 25 MG/ML IJ SOLN: 6.2500 mg | INTRAMUSCULAR | Status: DC | PRN

## 2015-06-27 MED ORDER — OXYCODONE HCL 5 MG PO TABS: 5.0000 mg | ORAL_TABLET | Freq: Once | ORAL | Status: DC | PRN

## 2015-06-27 MED ORDER — STERILE WATER FOR IRRIGATION IR SOLN
Status: DC | PRN
  Administered 2015-06-27: 11:00:00

## 2015-06-27 MED ORDER — HYDROMORPHONE HCL 1 MG/ML IJ SOLN: 0.2500 mg | INTRAMUSCULAR | Status: DC | PRN

## 2015-06-27 MED ORDER — OXYCODONE HCL 5 MG/5ML PO SOLN: 5.0000 mg | Freq: Once | ORAL | Status: DC | PRN

## 2015-06-27 MED ORDER — LIDOCAINE HCL (CARDIAC) 20 MG/ML IV SOLN
INTRAVENOUS | Status: DC | PRN
  Administered 2015-06-27: 30 mg via INTRAVENOUS

## 2015-06-27 MED ORDER — PROPOFOL 10 MG/ML IV BOLUS
INTRAVENOUS | Status: DC | PRN
  Administered 2015-06-27: 30 mg via INTRAVENOUS
  Administered 2015-06-27: 100 mg via INTRAVENOUS
  Administered 2015-06-27: 30 mg via INTRAVENOUS

## 2015-06-27 MED ORDER — MEPERIDINE HCL 25 MG/ML IJ SOLN: 6.2500 mg | INTRAMUSCULAR | Status: DC | PRN

## 2015-06-27 SURGICAL SUPPLY — 28 items

## 2015-06-27 NOTE — Anesthesia Preprocedure Evaluation (Addendum)
Anesthesia Evaluation  Patient identified by MRN, date of birth, ID band Patient awake    Reviewed: Allergy & Precautions, NPO status , Patient's Chart, lab work & pertinent test results, reviewed documented beta blocker date and time   Airway Mallampati: I  TM Distance: >3 FB Neck ROM: Full    Dental  (+) Missing Missing multiple upper and lower teeth:   Pulmonary former smoker,    Pulmonary exam normal        Cardiovascular Normal cardiovascular exam+ Valvular Problems/Murmurs MVP      Neuro/Psych negative neurological ROS     GI/Hepatic GERD  ,  Endo/Other  negative endocrine ROS  Renal/GU Renal disease (kidney stones)     Musculoskeletal  (+) Arthritis , Osteoarthritis,    Abdominal   Peds  Hematology negative hematology ROS (+)   Anesthesia Other Findings   Reproductive/Obstetrics                            Anesthesia Physical Anesthesia Plan  ASA: II  Anesthesia Plan: MAC   Post-op Pain Management:    Induction: Intravenous  Airway Management Planned:   Additional Equipment:   Intra-op Plan:   Post-operative Plan:   Informed Consent: I have reviewed the patients History and Physical, chart, labs and discussed the procedure including the risks, benefits and alternatives for the proposed anesthesia with the patient or authorized representative who has indicated his/her understanding and acceptance.     Plan Discussed with: CRNA  Anesthesia Plan Comments:         Anesthesia Quick Evaluation

## 2015-06-27 NOTE — H&P (Signed)
[facility]  [address] [place], [place] [address] Phone: [phone] Fax : [phone]  Primary Care Physician:  Leonard Downing, MD Primary Gastroenterologist:  Dr. Allen Norris  Pre-Procedure History & Physical: HPI:  Anthony [name] is a 64 y.o. male is here for a screening colonoscopy.   Past Medical History  Diagnosis Date  . GERD (gastroesophageal reflux disease)   . H/O mitral valve prolapse 2008 per echo    asymptomatic and does not see cardiologist.   . Hematuria   . Renal calculi     left  . Bilateral ureteral calculi   . History of kidney stones   . Renal insufficiency   . Chronic back pain   . 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 Arthritis     hands  . DDD (degenerative disc disease), lumbosacral     Past Surgical History  Procedure Laterality Date  . Biceps tendon repair Right 2004 approx  . Cystoscopy with retrograde pyelogram, ureteroscopy and stent placement Right 01/24/2014    Procedure: CYSTOSCOPY WITH RETROGRADE PYELOGRAM, URETEROSCOPY AND STENT PLACEMENT, holmium laser, stone extraction;  Surgeon: Arvil Persons, MD;  Location: [facility];  Service: Urology;  Laterality: Right;  . Cystoscopy with retrograde pyelogram, ureteroscopy and stent placement Right 02/07/2014    Procedure: RIGHT URETEROSCOPY, STONE EXTRACTION AND POSSIBLE STENT PLACEMENT;  Surgeon: Malka So, MD;  Location: [facility];  Service: Urology;  Laterality: Right;  . Holmium laser application Right 04/16/36    Procedure: HOLMIUM LASER APPLICATION;  Surgeon: Malka So, MD;  Location: [facility];  Service: Urology;  Laterality: Right;  . Cystoscopy with retrograde pyelogram, ureteroscopy and stent placement Bilateral 02/04/2015    Procedure: CYSTOSCOPY WITH RETROGRADE PYELOGRAM, URETEROSCOPY AND STENT PLACEMENT, BILATERAL;  Surgeon: Carolan Clines, MD;  Location: [facility];  Service: Urology;  Laterality: Bilateral;  . Extracorporeal shock wave lithotripsy  Left 02-13-2015//  Right 01-17-2014  . Cysto/  right retrograde pyelogram/ ureteroscopy attempted stone manipulation/  right stent placement  03-03-2009  . Right ureteroscopic stone extraction  03-13-2009  . Left ureteroscopic laser lithotripsy stone extraction w/ stent placement  03-09-2011;   09-18-2009  . Left ureteroscopic stone extraction w/ stent  09-25-2009  . Posterior fusion lumbar spine  09-08-2010    Re-do Laminectomy L3-S1 w/  Decompression and fusion   . Lumbar laminectomy  x2  ----  L3 - L5//  L5 -- S1  . Anterior cervical decomp/discectomy fusion  C5--C7  06-05-2002//  C7--T1   03-29-2006  . Transthoracic echocardiogram  07-04-2007    Mild focal basal septal hypertrophy/  ef 60%/  mild MVP involving anterior and posterior leaflets w/ trivial MR/  trivial pericardial effusion posterior to the heart  . Cystoscopy with ureteroscopy, stone basketry and stent placement Bilateral 02/20/2015    Procedure: CYSTOSCOPY WITH BILATERAL URETEROSCOPY, STONE EXTRACTION WITH LASER  AND STENT PLACEMENT;  Surgeon: Irine Seal, MD;  Location: [facility];  Service: Urology;  Laterality: Bilateral;  . Holmium laser application N/A 0/48/8891    Procedure: HOLMIUM LASER APPLICATION;  Surgeon: Irine Seal, MD;  Location: [facility];  Service: Urology;  Laterality: N/A;  . Laparoscopic cholecystectomy single port N/A 04/19/2015    Procedure: LAPAROSCOPIC CHOLECYSTECTOMY;  Surgeon: Florene Glen, MD;  Location: [facility];  Service: General;  Laterality: N/A;    Prior to Admission medications  Medication Sig Start Date End Date Taking? Authorizing Provider  diazepam (VALIUM) 5 MG tablet Take 1 tablet (5 mg total) by mouth every 8 (eight) hours as needed for anxiety. 05/05/15 05/04/16 Yes Loney Hering, MD  HYDROmorphone (DILAUDID) 2 MG tablet Take 1 tablet (2 mg total) by mouth every  4 (four) hours as needed for moderate pain or severe pain. 02/20/15  Yes Irine Seal, MD  Na Sulfate-K Sulfate-Mg Sulf (SUPREP BOWEL PREP) SOLN Take 1 kit by mouth as directed. Please take the first bottle at 5:00PM the day before procedure and the second bottle 4 hours prior to your procedure. 05/29/15  Yes Florene Glen, MD  naproxen sodium (ANAPROX) 220 MG tablet Take 220-440 mg by mouth once as needed.   Yes Historical Provider, MD  omeprazole (PRILOSEC OTC) 20 MG tablet Take 20 mg by mouth every morning.    Yes Historical Provider, MD  potassium citrate (UROCIT-K) 10 MEQ (1080 MG) SR tablet Take 10 mEq by mouth 2 (two) times daily.  04/23/15  Yes Historical Provider, MD  promethazine (PHENERGAN) 25 MG tablet Take 1 tablet (25 mg total) by mouth every 6 (six) hours as needed for nausea or vomiting. 02/20/15  Yes Irine Seal, MD  tamsulosin (FLOMAX) 0.4 MG CAPS capsule 1 capsule. 05/05/15  Yes Historical Provider, MD    Allergies as of 05/29/2015 - Review Complete 05/29/2015  Allergen Reaction Noted  . Codeine Itching 01/09/2014    Family History  Problem Relation Age of Onset  . Cancer Mother   . Diabetes Mother   . Hypertension Mother   . Stroke Mother   . Colon cancer Mother   . Hypertension Brother   . Colon cancer Brother     Pre-cancerous Polyps removed  . Kidney disease Brother     Chronic- secondary to Hypertension  . Stroke Father   . Thyroid disease Daughter   . Colon cancer Maternal Aunt     x 2  . Colon cancer Maternal Uncle     Social History   Social History  . Marital Status: Widowed    Spouse Name: N/A  . Number of Children: N/A  . Years of Education: N/A   Occupational History  . Not on file.   Social History Main Topics  . Smoking status: Former Smoker    Types: Pipe    Quit date: 02/17/2000  . Smokeless tobacco: Never Used  . Alcohol Use: 0.6 oz/week    1 Shots of liquor per week     Comment: occasionally   . Drug Use: No  . Sexual Activity: Not  on file   Other Topics Concern  . Not on file   Social History Narrative    Review of Systems: See HPI, otherwise negative ROS  Physical Exam: BP 128/85 mmHg  Pulse 71  Temp(Src) 97.7 F (36.5 C) (Temporal)  Resp 16  Ht $R'5\' 11"'wN$  (1.803 m)  Wt 165 lb (74.844 kg)  BMI 23.02 kg/m2  SpO2 98% General:   Alert,  pleasant and cooperative in NAD Head:  Normocephalic and atraumatic. Neck:  Supple; no masses or thyromegaly. Lungs:  Clear throughout to auscultation.    Heart:  Regular rate and rhythm. Abdomen:  Soft, nontender and nondistended. Normal bowel sounds, without guarding, and without rebound.   Neurologic:  Alert and  oriented x4;  grossly normal neurologically.  Impression/Plan: Anthony [name] is now here to undergo a screening colonoscopy.  Risks, benefits, and alternatives regarding colonoscopy have been  reviewed with the patient.  Questions have been answered.  All parties agreeable.

## 2015-06-27 NOTE — Op Note (Signed)
[facility]astroenter[facility] Patient Name: Anthony [name] Procedure Date: 06/27/2015 10:46 AM MRN: [id number] Account #: [id number] Date of Birth: [birth year] Admit Type: Outpatient Age: 64 Room: [facility] OR ROOM 01 Gender: Male Note Status: Finalized Procedure:         Colonoscopy Indications:       Screening for colorectal malignant neoplasm Providers:         Midge Minium, MD Referring MD:      Windle Guard, MD (Referring MD) Medicines:         Propofol per Anesthesia Complications:     No immediate complications. Procedure:         Pre-Anesthesia Assessment:                    - Prior to the procedure, a History and Physical was                     performed, and patient medications and allergies were                     reviewed. The patient's tolerance of previous anesthesia                     was also reviewed. The risks and benefits of the procedure                     and the sedation options and risks were discussed with the                     patient. All questions were answered, and informed consent                     was obtained. Prior Anticoagulants: The patient has taken                     no previous anticoagulant or antiplatelet agents. ASA                     Grade Assessment: II - A patient with mild systemic                     disease. After reviewing the risks and benefits, the                     patient was deemed in satisfactory condition to undergo                     the procedure.                    After obtaining informed consent, the colonoscope was                     passed under direct vision. Throughout the procedure, the                     patient's blood pressure, pulse, and oxygen saturations                     were monitored continuously. The Olympus CF H180AL                     colonoscope (S#: [id number]) was introduced through the anus  and advanced to the the cecum, identified by appendiceal           orifice and ileocecal valve. The colonoscopy was performed                     without difficulty. The patient tolerated the procedure                     well. The quality of the bowel preparation was excellent. Findings:      The perianal and digital rectal examinations were normal.      Multiple small-mouthed diverticula were found in the sigmoid colon.      Non-bleeding internal hemorrhoids were found during retroflexion. The       hemorrhoids were Grade II (internal hemorrhoids that prolapse but reduce       spontaneously). Impression:        - Diverticulosis in the sigmoid colon.                    - Non-bleeding internal hemorrhoids.                    - No specimens collected. Recommendation:    - Repeat colonoscopy in 10 years for screening unless any                     change in family history or lower GI problems. Procedure Code(s): --- Professional ---                    [phone], Colonoscopy, flexible; diagnostic, including                     collection of specimen(s) by brushing or washing, when                     performed (separate procedure) Diagnosis Code(s): --- Professional ---                    Z12.11, Encounter for screening for malignant neoplasm of                     colon                    K64.1, Second degree hemorrhoids CPT copyright 2014 American Medical Association. All rights reserved. The codes documented in this report are preliminary and upon coder review may  be revised to meet current compliance requirements. Midge Miniumarren Nesreen Albano, MD 06/27/2015 11:13:23 AM This report has been signed electronically. Number of Addenda: 0 Note Initiated On: 06/27/2015 10:46 AM Scope Withdrawal Time: 0 hours 6 minutes 21 seconds  Total Procedure Duration: 0 hours 10 minutes 41 seconds       [facility]

## 2015-06-27 NOTE — Anesthesia Procedure Notes (Signed)
Procedure Name: MAC Performed by: Rodolphe Edmonston Pre-anesthesia Checklist: Patient identified, Emergency Drugs available, Suction available, Patient being monitored and Timeout performed Patient Re-evaluated:Patient Re-evaluated prior to inductionOxygen Delivery Method: Nasal cannula       

## 2015-06-27 NOTE — Anesthesia Postprocedure Evaluation (Signed)
  Anesthesia Post-op Note  Patient: Anthony LeffWilliam R Consoli  Procedure(s) Performed: Procedure(s): COLONOSCOPY WITH PROPOFOL (N/A)  Anesthesia type:MAC  Patient location: PACU  Post pain: Pain level controlled  Post assessment: Post-op Vital signs reviewed, Patient's Cardiovascular Status Stable, Respiratory Function Stable, Patent Airway and No signs of Nausea or vomiting  Post vital signs: Reviewed and stable  Last Vitals:  Filed Vitals:   06/27/15 1122  BP: 109/76  Pulse: 71  Temp:   Resp: 21    Level of consciousness: awake, alert  and patient cooperative  Complications: No apparent anesthesia complications

## 2015-06-27 NOTE — Transfer of Care (Signed)
Immediate Anesthesia Transfer of Care Note  Patient: Anthony LeffWilliam R [name]  Procedure(s) Performed: Procedure(s): COLONOSCOPY WITH PROPOFOL (N/A)  Patient Location: PACU  Anesthesia Type: MAC  Level of Consciousness: awake, alert  and patient cooperative  Airway and Oxygen Therapy: Patient Spontanous Breathing and Patient connected to supplemental oxygen  Post-op Assessment: Post-op Vital signs reviewed, Patient's Cardiovascular Status Stable, Respiratory Function Stable, Patent Airway and No signs of Nausea or vomiting  Post-op Vital Signs: Reviewed and stable  Complications: No apparent anesthesia complications

## 2015-06-30 ENCOUNTER — Encounter: Payer: Self-pay | Admitting: Gastroenterology

## 2015-07-23 ENCOUNTER — Telehealth: Payer: Self-pay | Admitting: Surgery

## 2015-07-23 NOTE — Telephone Encounter (Signed)
Patient would like to speak with a nurse. He had lap chole with Dr Excell Seltzerooper in August. Had pain few weeks later, went to ER, Dr Excell Seltzerooper saw him and said it was probably stone still stuck in one of his ducts and that it would resolve itself and that if it happened again he would need a scope. It cleared up but has started again. Patient is in a lot of pain and would like to speak with nurse about the pain, what he should do, and if he should make urgent appt or go to ER? Please call and advise.

## 2015-07-23 NOTE — Telephone Encounter (Signed)
Returned patient call. Patient reports that he has been having various levels of RUQ pain over the past few days, and he started having severe pain just below his rib cage this past pm that required pain medication. Patient does have a history of gallstones. No fever reported. Patient directed to go to the ED for evaluation and treatment of a possible stone. Patient confirmed understanding of information and direction.

## 2015-09-18 ENCOUNTER — Inpatient Hospital Stay: Payer: BLUE CROSS/BLUE SHIELD

## 2015-09-18 ENCOUNTER — Encounter: Payer: Self-pay | Admitting: Emergency Medicine

## 2015-09-18 ENCOUNTER — Inpatient Hospital Stay
Admission: EM | Admit: 2015-09-18 | Discharge: 2015-09-19 | DRG: 392 | Disposition: A | Discharge status: Home / Self Care | Payer: BLUE CROSS/BLUE SHIELD | Attending: Internal Medicine | Admitting: Internal Medicine

## 2015-09-18 DIAGNOSIS — M549 Dorsalgia, unspecified: Secondary | ICD-10-CM | POA: Diagnosis present

## 2015-09-18 DIAGNOSIS — K8051 Calculus of bile duct without cholangitis or cholecystitis with obstruction: Secondary | ICD-10-CM

## 2015-09-18 DIAGNOSIS — Z9049 Acquired absence of other specified parts of digestive tract: Secondary | ICD-10-CM

## 2015-09-18 DIAGNOSIS — R7401 Elevation of levels of liver transaminase levels: Secondary | ICD-10-CM | POA: Diagnosis present

## 2015-09-18 DIAGNOSIS — G8929 Other chronic pain: Secondary | ICD-10-CM | POA: Diagnosis present

## 2015-09-18 DIAGNOSIS — M13841 Other specified arthritis, right hand: Secondary | ICD-10-CM | POA: Diagnosis present

## 2015-09-18 DIAGNOSIS — K219 Gastro-esophageal reflux disease without esophagitis: Secondary | ICD-10-CM | POA: Diagnosis present

## 2015-09-18 DIAGNOSIS — N400 Enlarged prostate without lower urinary tract symptoms: Secondary | ICD-10-CM | POA: Diagnosis present

## 2015-09-18 DIAGNOSIS — R1013 Epigastric pain: Principal | ICD-10-CM

## 2015-09-18 DIAGNOSIS — Z885 Allergy status to narcotic agent status: Secondary | ICD-10-CM

## 2015-09-18 DIAGNOSIS — I341 Nonrheumatic mitral (valve) prolapse: Secondary | ICD-10-CM | POA: Diagnosis present

## 2015-09-18 DIAGNOSIS — K439 Ventral hernia without obstruction or gangrene: Secondary | ICD-10-CM | POA: Diagnosis present

## 2015-09-18 DIAGNOSIS — Z87891 Personal history of nicotine dependence: Secondary | ICD-10-CM | POA: Diagnosis not present

## 2015-09-18 DIAGNOSIS — N183 Chronic kidney disease, stage 3 unspecified: Secondary | ICD-10-CM | POA: Diagnosis present

## 2015-09-18 DIAGNOSIS — Z91011 Allergy to milk products: Secondary | ICD-10-CM | POA: Diagnosis not present

## 2015-09-18 DIAGNOSIS — M5137 Other intervertebral disc degeneration, lumbosacral region: Secondary | ICD-10-CM | POA: Diagnosis present

## 2015-09-18 DIAGNOSIS — R740 Nonspecific elevation of levels of transaminase and lactic acid dehydrogenase [LDH]: Secondary | ICD-10-CM | POA: Diagnosis present

## 2015-09-18 DIAGNOSIS — M13842 Other specified arthritis, left hand: Secondary | ICD-10-CM | POA: Diagnosis present

## 2015-09-18 LAB — COMPREHENSIVE METABOLIC PANEL
ALT: 136 U/L — ABNORMAL HIGH (ref 17–63)
AST: 200 U/L — ABNORMAL HIGH (ref 15–41)
Albumin: 4 g/dL (ref 3.5–5.0)
Alkaline Phosphatase: 218 U/L — ABNORMAL HIGH (ref 38–126)
Anion gap: 6 (ref 5–15)
BUN: 19 mg/dL (ref 6–20)
CO2: 26 mmol/L (ref 22–32)
Calcium: 9.5 mg/dL (ref 8.9–10.3)
Chloride: 106 mmol/L (ref 101–111)
Creatinine, Ser: 1.52 mg/dL — ABNORMAL HIGH (ref 0.61–1.24)
GFR calc Af Amer: 55 mL/min — ABNORMAL LOW (ref >60)
GFR calc non Af Amer: 47 mL/min — ABNORMAL LOW (ref >60)
Glucose, Bld: 106 mg/dL — ABNORMAL HIGH (ref 65–99)
Potassium: 4 mmol/L (ref 3.5–5.1)
Sodium: 138 mmol/L (ref 135–145)
Total Bilirubin: 1.9 mg/dL — ABNORMAL HIGH (ref 0.3–1.2)
Total Protein: 7 g/dL (ref 6.5–8.1)

## 2015-09-18 LAB — URINALYSIS COMPLETE WITH MICROSCOPIC (ARMC ONLY)
Bacteria, UA: NONE SEEN
Bilirubin Urine: NEGATIVE
Glucose, UA: NEGATIVE mg/dL
Hgb urine dipstick: NEGATIVE
Ketones, ur: NEGATIVE mg/dL
Leukocytes, UA: NEGATIVE
Nitrite: NEGATIVE
Protein, ur: NEGATIVE mg/dL
Specific Gravity, Urine: 1.011 (ref 1.005–1.030)
pH: 6 (ref 5.0–8.0)

## 2015-09-18 LAB — TROPONIN I: Troponin I: <0.03 ng/mL (ref <0.031)

## 2015-09-18 LAB — LIPASE, BLOOD: Lipase: 66 U/L — ABNORMAL HIGH (ref 11–51)

## 2015-09-18 LAB — IRON: Iron: 247 ug/dL — ABNORMAL HIGH (ref 45–182)

## 2015-09-18 LAB — CBC
HCT: 48.6 % (ref 40.0–52.0)
Hemoglobin: 15.9 g/dL (ref 13.0–18.0)
MCH: 27.4 pg (ref 26.0–34.0)
MCHC: 32.7 g/dL (ref 32.0–36.0)
MCV: 83.8 fL (ref 80.0–100.0)
Platelets: 162 10*3/uL (ref 150–440)
RBC: 5.79 MIL/uL (ref 4.40–5.90)
RDW: 14.5 % (ref 11.5–14.5)
WBC: 8.4 10*3/uL (ref 3.8–10.6)

## 2015-09-18 LAB — FERRITIN: Ferritin: 28 ng/mL (ref 24–336)

## 2015-09-18 MED ORDER — TAMSULOSIN HCL 0.4 MG PO CAPS
0.4000 mg | ORAL_CAPSULE | Freq: Every day | ORAL | Status: DC
  Administered 2015-09-18 – 2015-09-19 (×2): 0.4 mg via ORAL
  Filled 2015-09-18 (×2): qty 1

## 2015-09-18 MED ORDER — GADOBENATE DIMEGLUMINE 529 MG/ML IV SOLN
20.0000 mL | Freq: Once | INTRAVENOUS | Status: AC | PRN
  Administered 2015-09-18: 16 mL via INTRAVENOUS

## 2015-09-18 MED ORDER — DIAZEPAM 2 MG PO TABS: 5.0000 mg | ORAL_TABLET | Freq: Three times a day (TID) | ORAL | Status: DC | PRN

## 2015-09-18 MED ORDER — ONDANSETRON HCL 4 MG/2ML IJ SOLN: 4.0000 mg | Freq: Four times a day (QID) | INTRAMUSCULAR | Status: DC | PRN

## 2015-09-18 MED ORDER — TRAMADOL HCL 50 MG PO TABS
50.0000 mg | ORAL_TABLET | Freq: Four times a day (QID) | ORAL | Status: DC | PRN
  Administered 2015-09-18: 50 mg via ORAL
  Filled 2015-09-18: qty 1

## 2015-09-18 MED ORDER — ENOXAPARIN SODIUM 40 MG/0.4ML ~~LOC~~ SOLN
40.0000 mg | SUBCUTANEOUS | Status: DC
  Filled 2015-09-18: qty 0.4

## 2015-09-18 MED ORDER — ONDANSETRON HCL 4 MG PO TABS: 4.0000 mg | ORAL_TABLET | Freq: Four times a day (QID) | ORAL | Status: DC | PRN

## 2015-09-18 MED ORDER — SODIUM CHLORIDE 0.9 % IV SOLN
INTRAVENOUS | Status: DC
  Administered 2015-09-18: 06:00:00 via INTRAVENOUS

## 2015-09-18 MED ORDER — PANTOPRAZOLE SODIUM 40 MG PO TBEC
40.0000 mg | DELAYED_RELEASE_TABLET | Freq: Every day | ORAL | Status: DC
  Administered 2015-09-18 – 2015-09-19 (×2): 40 mg via ORAL
  Filled 2015-09-18 (×2): qty 1

## 2015-09-18 NOTE — Progress Notes (Signed)
[facility] [place] [facility]   PATIENT NAME: Anthony [name]    MR#:  [id number]  DATE OF BIRTH:  09/04/1950  SUBJECTIVE:  Came in with right upper quadrant abdominal pain and epigastric pain. Pain much better today. No nausea or vomiting. Feels hungry and ready to eat some food.  REVIEW OF SYSTEMS:   Review of Systems  Constitutional: Negative for fever, chills and weight loss.  HENT: Negative for ear discharge, ear pain and nosebleeds.   Eyes: Negative for blurred vision, pain and discharge.  Respiratory: Negative for sputum production, shortness of breath, wheezing and stridor.   Cardiovascular: Negative for chest pain, palpitations, orthopnea and PND.  Gastrointestinal: Positive for abdominal pain. Negative for nausea, vomiting and diarrhea.  Genitourinary: Negative for urgency and frequency.  Musculoskeletal: Negative for back pain and joint pain.  Neurological: Positive for weakness. Negative for sensory change, speech change and focal weakness.  Psychiatric/Behavioral: Negative for depression and hallucinations. The patient is not nervous/anxious.   All other systems reviewed and are negative.  Tolerating Diet:yes Tolerating PT: not needed  DRUG ALLERGIES:   Allergies  Allergen Reactions  . Cheese Other (See Comments)    cramps  . Codeine Itching  . Lactose Intolerance (Gi) Other (See Comments)    GI distress    VITALS:  Blood pressure 132/87, pulse 68, temperature 97.8 F (36.6 C), temperature source Oral, resp. rate 18, height  (1.778 m), weight 79.334 kg (174 lb 14.4 oz), SpO2 97 %.  PHYSICAL EXAMINATION:   Physical Exam  GENERAL:  65 y.o.-year-old patient lying in the bed with no acute distress.  EYES: Pupils equal, round, reactive to light and accommodation. No scleral icterus. Extraocular muscles intact.  HEENT: Head atraumatic, normocephalic. Oropharynx and nasopharynx clear.  NECK:  Supple, no jugular venous  distention. No thyroid enlargement, no tenderness.  LUNGS: Normal breath sounds bilaterally, no wheezing, rales, rhonchi. No use of accessory muscles of respiration.  CARDIOVASCULAR: S1, S2 normal. No murmurs, rubs, or gallops.  ABDOMEN: Soft, nontender, nondistended. Bowel sounds present. No organomegaly or mass.  EXTREMITIES: No cyanosis, clubbing or edema b/l.    NEUROLOGIC: Cranial nerves II through XII are intact. No focal Motor or sensory deficits b/l.   PSYCHIATRIC:  patient is alert and oriented x 3.  SKIN: No obvious rash, lesion, or ulcer.   LABORATORY PANEL:  CBC  Recent Labs Lab 09/18/15 0111  WBC 8.4  HGB 15.9  HCT 48.6  PLT 162    Chemistries   Recent Labs Lab 09/18/15 0111  NA 138  K 4.0  CL 106  CO2 26  GLUCOSE 106*  BUN 19  CREATININE 1.52*  CALCIUM 9.5  AST 200*  ALT 136*  ALKPHOS 218*  BILITOT 1.9*   Cardiac Enzymes  Recent Labs Lab 09/18/15 0111  TROPONINI <0.03   RADIOLOGY:  Mr Abd W/wo Cm/mrcp  09/18/2015  CLINICAL DATA:  Severe epigastric pain following cholecystectomy 4 months ago. Evaluate for possible retained common bile duct stone. EXAM: MRI ABDOMEN WITHOUT AND WITH CONTRAST (INCLUDING MRCP) TECHNIQUE: Multiplanar multisequence MR imaging of the abdomen was performed both before and after the administration of intravenous contrast. Heavily T2-weighted images of the biliary and pancreatic ducts were obtained, and three-dimensional MRCP images were rendered by post processing. CONTRAST:  16mL MULTIHANCE GADOBENATE DIMEGLUMINE 529 MG/ML IV SOLN COMPARISON:  CT scan 05/05/2015 FINDINGS: Examination is limited by breathing motion artifact. Lower chest: No pleural or pericardial effusion. Moderate to large hiatal  hernia. No worrisome pulmonary lesions. Hepatobiliary: No focal hepatic lesions or intrahepatic biliary dilatation. The gallbladder is surgically absent. Normal caliber and course of the common bile duct. No dilatation or obstructing  common bile duct stones. No obvious pancreatic mass or ampullary lesion. Pancreas: No mass, inflammation or ductal dilatation. Spleen: Normal size.  No focal lesions. Adrenals/Urinary Tract: The adrenal glands are normal. Chronic bilateral hydronephrosis. No worrisome renal lesions. Stomach/Bowel: The stomach, duodenum, visualized small bowel and visualized colon are grossly normal. Vascular/Lymphatic: No mesenteric or retroperitoneal mass or adenopathy. The aorta and branch vessels are patent. The major venous structures are patent. Other: No ascites or abdominal wall hernia. Musculoskeletal: No significant bony findings. Lumbar fusion hardware noted. IMPRESSION: 1. Normal caliber and course of the common bile duct and pancreatic duct. No common bile duct stones, pancreatic head mass or ampullary lesion. 2. Status post cholecystectomy.  No intrahepatic biliary dilatation. 3. Chronic bilateral hydronephrosis. Electronically Signed   By: Rudie Meyer M.D.   On: 09/18/2015 13:17   ASSESSMENT AND PLAN:  Anthony [name] is a 65 y.o. male who presents with epigastric abdominal pain. Patient recently had cholecystectomy. At that time it was felt he might have had a dilated common bile duct. Subsequent workup for possible retained bile stone was initially felt to be warranted, but later was not pursued.   1.Hyperbilirubinemia with abnormal LFTs  - etiology unclear at present. - Negative for dilated CBD -Spoke with GI doctor [facility].  -We'll order hepatitis panel, alpha-1 antitrypsin, SMA, ferritin, iron levels, ceruloplasmin   2. Epigastric abdominal pain -  See treatment above.   3.  Elevated transaminase level - see above  4. CKD (chronic kidney disease), stage III - avoid nephrotoxins, monitor closely, at baseline    5. BPH (benign prostatic hyperplasia) - continue home meds  6. GERD (gastroesophageal reflux disease) - home dose PPI   Case discussed with Care Management/Social Worker. Management  plans discussed with the patient, family and they are in agreement.  CODE STATUS: full   DVT Prophylaxis: Lovenox   TOTAL TIME TAKING CARE OF THIS PATIENT 30 mins >50% time spent on counselling and coordination of care  POSSIBLE D/C IN 1-2 DAYS, DEPENDING ON CLINICAL CONDITION.  Note: This dictation was prepared with Dragon dictation along with smaller phrase technology. Any transcriptional errors that result from this process are unintentional.  Maniya Donovan M.D on 09/18/2015 at 2:51 PM  Between 7am to 6pm - Pager - [phone]  After 6pm go to [email or web address] - password [email or web address]  [facility]  Office  [phone]  CC: Primary care physician; [name], MD

## 2015-09-18 NOTE — Consult Note (Signed)
[facility]  [address] [place], [place] [address] Phone: [phone] Fax : [phone]  Consultation  Referring Provider:     No ref. provider found Primary Care Physician:  [name], MD Primary Gastroenterologist:  Dr. Servando Snare         Reason for Consultation:     Abdominal pain and abnormal liver enzymes  Date of Admission:  09/18/2015 Date of Consultation:  09/18/2015         HPI:   Anthony [name] is a 65 y.o. male who was admitted with epigastric pain. The patient reports that the epigastric pain is worse when he moves around and when he is working. He also states that it happens intermittently while sitting and watching TV. The patient states that his epigastric pain and not made worse with eating or drinking. He also states that the epigastric pain is not related to his bowel movements. The patient did have a report of a dilated common bile duct after having an acute cholecystectomy in the past. The patient underwent an MRCP today that did not show any dilation of the common bile duct or filling defects. The patient has had elevated liver enzymes all the way back to September of last year with no workup done for that. The patient now has abnormal liver enzymes which appear to be significant higher than they were in the past and he has also had some increased bilirubin. The patient denies any alcohol abuse although his AST is higher than his ALT. He denies any anti-inflammatory medication. The patient also has chronic back pain and states that the pain can come and without warning also. He denies taking any anti-inflammatory medication but appears to be on Naprosyn at home.  Past Medical History  Diagnosis Date  . GERD (gastroesophageal reflux disease)   . H/O mitral valve prolapse 2008 per echo    asymptomatic and does not see cardiologist.   . Hematuria   . Renal calculi     left  . Bilateral ureteral calculi   . History of kidney stones   .  Renal insufficiency   . Chronic back pain   . 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 Arthritis     hands  . DDD (degenerative disc disease), lumbosacral     Past Surgical History  Procedure Laterality Date  . Biceps tendon repair Right 2004 approx  . Cystoscopy with retrograde pyelogram, ureteroscopy and stent placement Right 01/24/2014    Procedure: CYSTOSCOPY WITH RETROGRADE PYELOGRAM, URETEROSCOPY AND STENT PLACEMENT, holmium laser, stone extraction;  Surgeon: Danae Chen, MD;  Location: [facility];  Service: Urology;  Laterality: Right;  . Cystoscopy with retrograde pyelogram, ureteroscopy and stent placement Right 02/07/2014    Procedure: RIGHT URETEROSCOPY, STONE EXTRACTION AND POSSIBLE STENT PLACEMENT;  Surgeon: Anner Crete, MD;  Location: [facility];  Service: Urology;  Laterality: Right;  . Holmium laser application Right 02/07/2014    Procedure: HOLMIUM LASER APPLICATION;  Surgeon: Anner Crete, MD;  Location: [facility];  Service: Urology;  Laterality: Right;  . Cystoscopy with retrograde pyelogram, ureteroscopy and stent placement Bilateral 02/04/2015    Procedure: CYSTOSCOPY WITH RETROGRADE PYELOGRAM, URETEROSCOPY AND STENT PLACEMENT, BILATERAL;  Surgeon: Jethro Bolus, MD;  Location: [facility];  Service: Urology;  Laterality: Bilateral;  . Extracorporeal shock wave lithotripsy  Left 02-13-2015//  Right 01-17-2014  . Cysto/  right  retrograde pyelogram/ ureteroscopy attempted stone manipulation/  right stent placement  03-03-2009  . Right ureteroscopic stone extraction  03-13-2009  . Left ureteroscopic laser lithotripsy stone extraction w/ stent placement  03-09-2011;   09-18-2009  . Left ureteroscopic stone extraction w/ stent  09-25-2009  . Posterior fusion lumbar spine  09-08-2010    Re-do Laminectomy L3-S1 w/  Decompression and fusion   . Lumbar laminectomy  x2  ----  L3 - L5//  L5  -- S1  . Anterior cervical decomp/discectomy fusion  C5--C7  06-05-2002//  C7--T1   03-29-2006  . Transthoracic echocardiogram  07-04-2007    Mild focal basal septal hypertrophy/  ef 60%/  mild MVP involving anterior and posterior leaflets w/ trivial MR/  trivial pericardial effusion posterior to the heart  . Cystoscopy with ureteroscopy, stone basketry and stent placement Bilateral 02/20/2015    Procedure: CYSTOSCOPY WITH BILATERAL URETEROSCOPY, STONE EXTRACTION WITH LASER  AND STENT PLACEMENT;  Surgeon: Bjorn Pippin, MD;  Location: [facility];  Service: Urology;  Laterality: Bilateral;  . Holmium laser application N/A 02/20/2015    Procedure: HOLMIUM LASER APPLICATION;  Surgeon: Bjorn Pippin, MD;  Location: [facility];  Service: Urology;  Laterality: N/A;  . Laparoscopic cholecystectomy single port N/A 04/19/2015    Procedure: LAPAROSCOPIC CHOLECYSTECTOMY;  Surgeon: Lattie Haw, MD;  Location: [facility];  Service: General;  Laterality: N/A;  . Colonoscopy with propofol N/A 06/27/2015    Procedure: COLONOSCOPY WITH PROPOFOL;  Surgeon: Midge Minium, MD;  Location: [facility];  Service: Endoscopy;  Laterality: N/A;    Prior to Admission medications   Medication Sig Start Date End Date Taking? Authorizing Provider  diazepam (VALIUM) 5 MG tablet Take 1 tablet (5 mg total) by mouth every 8 (eight) hours as needed for anxiety. 05/05/15 05/04/16  Rebecka Apley, MD  HYDROmorphone (DILAUDID) 2 MG tablet Take 1 tablet (2 mg total) by mouth every 4 (four) hours as needed for moderate pain or severe pain. 02/20/15   Bjorn Pippin, MD  naproxen sodium (ANAPROX) 220 MG tablet Take 220-440 mg by mouth once as needed.    Historical Provider, MD  omeprazole (PRILOSEC OTC) 20 MG tablet Take 20 mg by mouth every morning.     Historical Provider, MD    Family History  Problem Relation Age of Onset  . Cancer Mother   . Diabetes Mother   . Hypertension Mother   . Stroke Mother    . Colon cancer Mother   . Hypertension Brother   . Colon cancer Brother     Pre-cancerous Polyps removed  . Kidney disease Brother     Chronic- secondary to Hypertension  . Stroke Father   . Thyroid disease Daughter   . Colon cancer Maternal Aunt     x 2  . Colon cancer Maternal Uncle      Social History  Substance Use Topics  . Smoking status: Former Smoker    Types: Pipe    Quit date: 02/17/2000  . Smokeless tobacco: Never Used  . Alcohol Use: 0.6 oz/week    1 Shots of liquor per week     Comment: occasionally     Allergies as of 09/18/2015 - Review Complete 09/18/2015  Allergen Reaction Noted  . Cheese Other (See Comments) 06/23/2015  . Codeine Itching 01/09/2014  . Lactose intolerance (gi) Other (See Comments) 06/23/2015    Review of Systems:    All systems reviewed and negative except where noted in HPI.  Physical Exam:  Vital signs in last 24 hours: Temp:  [97.7 F (36.5 C)-97.8 F (36.6 C)] 97.8 F (36.6 C) (01/26 1322) Pulse Rate:  [68-74] 68 (01/26 1322) Resp:  [18-20] 18 (01/26 1322) BP: (132-151)/(80-96) 132/87 mmHg (01/26 1322) SpO2:  [97 %-100 %] 97 % (01/26 1322) Weight:  [174 lb (78.926 kg)-180 lb (81.647 kg)] 174 lb (78.926 kg) (01/26 1159) Last BM Date: 09/17/15 General:   Pleasant, cooperative in NAD Head:  Normocephalic and atraumatic. Eyes:   No icterus.   Conjunctiva pink. PERRLA. Ears:  Normal auditory acuity. Neck:  Supple; no masses or thyroidomegaly Lungs: Respirations even and unlabored. Lungs clear to auscultation bilaterally.   No wheezes, crackles, or rhonchi.  Heart:  Regular rate and rhythm;  Without murmur, clicks, rubs or gallops Abdomen:  Soft, nondistended, positive tenderness in the epigastric area. Normal bowel sounds. No appreciable masses or hepatomegaly.  No rebound or guarding. The patient has a ventral hernia and the apex of the hernia is tender to light palpation Rectal:  Not performed. Msk:  Symmetrical without  gross deformities.    Extremities:  Without edema, cyanosis or clubbing. Neurologic:  Alert and oriented x3;  grossly normal neurologically. Skin:  Intact without significant lesions or rashes. Cervical Nodes:  No significant cervical adenopathy. Psych:  Alert and cooperative. Normal affect.  LAB RESULTS:  Recent Labs  09/18/15 0111  WBC 8.4  HGB 15.9  HCT 48.6  PLT 162   BMET  Recent Labs  09/18/15 0111  NA 138  K 4.0  CL 106  CO2 26  GLUCOSE 106*  BUN 19  CREATININE 1.52*  CALCIUM 9.5   LFT  Recent Labs  09/18/15 0111  PROT 7.0  ALBUMIN 4.0  AST 200*  ALT 136*  ALKPHOS 218*  BILITOT 1.9*   PT/INR No results for input(s): LABPROT, INR in the last 72 hours.  STUDIES: Mr Roe Coombs W/wo Cm/mrcp  09/18/2015  CLINICAL DATA:  Severe epigastric pain following cholecystectomy 4 months ago. Evaluate for possible retained common bile duct stone. EXAM: MRI ABDOMEN WITHOUT AND WITH CONTRAST (INCLUDING MRCP) TECHNIQUE: Multiplanar multisequence MR imaging of the abdomen was performed both before and after the administration of intravenous contrast. Heavily T2-weighted images of the biliary and pancreatic ducts were obtained, and three-dimensional MRCP images were rendered by post processing. CONTRAST:  16mL MULTIHANCE GADOBENATE DIMEGLUMINE 529 MG/ML IV SOLN COMPARISON:  CT scan 05/05/2015 FINDINGS: Examination is limited by breathing motion artifact. Lower chest: No pleural or pericardial effusion. Moderate to large hiatal hernia. No worrisome pulmonary lesions. Hepatobiliary: No focal hepatic lesions or intrahepatic biliary dilatation. The gallbladder is surgically absent. Normal caliber and course of the common bile duct. No dilatation or obstructing common bile duct stones. No obvious pancreatic mass or ampullary lesion. Pancreas: No mass, inflammation or ductal dilatation. Spleen: Normal size.  No focal lesions. Adrenals/Urinary Tract: The adrenal glands are normal. Chronic  bilateral hydronephrosis. No worrisome renal lesions. Stomach/Bowel: The stomach, duodenum, visualized small bowel and visualized colon are grossly normal. Vascular/Lymphatic: No mesenteric or retroperitoneal mass or adenopathy. The aorta and branch vessels are patent. The major venous structures are patent. Other: No ascites or abdominal wall hernia. Musculoskeletal: No significant bony findings. Lumbar fusion hardware noted. IMPRESSION: 1. Normal caliber and course of the common bile duct and pancreatic duct. No common bile duct stones, pancreatic head mass or ampullary lesion. 2. Status post cholecystectomy.  No intrahepatic biliary dilatation. 3. Chronic bilateral hydronephrosis. Electronically Signed   By: Demetrius Charity.  Gallerani M.D.   On: 09/18/2015 13:17      Impression / Plan:   Anthony [name] is a 65 y.o. y/o male with abdominal pain that is epigastric right at the area of his ventral hernia. The pain is made worse by physical activity but may calm wall just sitting around and doing nothing. The patient denies it being worse when he uses his GI tract such as eating or drinking or when he moves his bowels. The patient also has not had any change in bowel habits. The abdominal pain is most likely musculoskeletal. The patient's liver enzymes are elevated and have been since at least 4 months ago. There are higher at the present time. The patient will have labs sent off to look for the cause of his abnormal liver enzymes. The patient's Naprosyn that he is reported to be taking at home may be a cause for this as well as other possible causes which may be delineated with his blood work. If none of the blood tests are diagnostic or informative the patient may need a liver biopsy in the future as an outpatient. The patient and his wife have been explained the plan and agree with it.   Thank you for involving me in the care of this patient.      LOS: 0 days   Darlina Rumpf, MD  09/18/2015, 5:19 PM   Note: This  dictation was prepared with Dragon dictation along with smaller phrase technology. Any transcriptional errors that result from this process are unintentional.

## 2015-09-18 NOTE — ED Notes (Signed)
Patient ambulatory to triage with steady gait, without difficulty or distress noted; pt reports cholecystectomy in August (gangrene) and was told possible stone remains; st for months has had persistent mid upper abd pain radiating under ribs bilat with no accomp symptoms; denies accomp symptoms

## 2015-09-18 NOTE — ED Notes (Addendum)
Pt has upper abd pain. Sx since 2200.  No n/v/d.  Pt states valium helps with pain.  Pt had gallbladder removed in 8/16.   Pt denies chest pain.  States intermitent sob with pain.

## 2015-09-18 NOTE — H&P (Signed)
[facility] [place] NAME: Wood Novacek    MR#:  [id number]  DATE OF BIRTH:  1951-01-05  DATE OF ADMISSION:  09/18/2015  PRIMARY CARE PHYSICIAN: Leonard Downing, MD   REQUESTING/REFERRING PHYSICIAN: Karma Greaser, MD  CHIEF COMPLAINT:   Chief Complaint  Patient presents with  . Abdominal Pain    HISTORY OF PRESENT ILLNESS:  Anthony [name]  is a 65 y.o. male who presents with epigastric abdominal pain. Patient recently had cholecystectomy. At that time it was felt he might have had a dilated common bile duct. Subsequent workup for possible retained bile stone was initially felt to be warranted, but later was not pursued. Patient returned couple of times with epigastric pain, but workup was again not pursued. Chart review is unclear as to the reason for this. However, he comes to the ED tonight with severe epigastric pain, which has now resolved by the time this writer examines him. Lab review reveals transaminitis and hyperbilirubinemia. Discussion with gastroenterologist on call recommended admission and MRCP. Hospitalists were called for the same.  PAST MEDICAL HISTORY:   Past Medical History  Diagnosis Date  . GERD (gastroesophageal reflux disease)   . H/O mitral valve prolapse 2008 per echo    asymptomatic and does not see cardiologist.   . Hematuria   . Renal calculi     left  . Bilateral ureteral calculi   . History of kidney stones   . Renal insufficiency   . Chronic back pain   . 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 Arthritis     hands  . DDD (degenerative disc disease), lumbosacral     PAST SURGICAL HISTORY:   Past Surgical History  Procedure Laterality Date  . Biceps tendon repair Right 2004 approx  . Cystoscopy with retrograde pyelogram, ureteroscopy and stent placement Right 01/24/2014    Procedure: CYSTOSCOPY WITH RETROGRADE PYELOGRAM, URETEROSCOPY AND STENT  PLACEMENT, holmium laser, stone extraction;  Surgeon: Arvil Persons, MD;  Location: [facility];  Service: Urology;  Laterality: Right;  . Cystoscopy with retrograde pyelogram, ureteroscopy and stent placement Right 02/07/2014    Procedure: RIGHT URETEROSCOPY, STONE EXTRACTION AND POSSIBLE STENT PLACEMENT;  Surgeon: Malka So, MD;  Location: [facility];  Service: Urology;  Laterality: Right;  . Holmium laser application Right 9/76/7341    Procedure: HOLMIUM LASER APPLICATION;  Surgeon: Malka So, MD;  Location: [facility];  Service: Urology;  Laterality: Right;  . Cystoscopy with retrograde pyelogram, ureteroscopy and stent placement Bilateral 02/04/2015    Procedure: CYSTOSCOPY WITH RETROGRADE PYELOGRAM, URETEROSCOPY AND STENT PLACEMENT, BILATERAL;  Surgeon: Carolan Clines, MD;  Location: [facility];  Service: Urology;  Laterality: Bilateral;  . Extracorporeal shock wave lithotripsy  Left 02-13-2015//  Right 01-17-2014  . Cysto/  right retrograde pyelogram/ ureteroscopy attempted stone manipulation/  right stent placement  03-03-2009  . Right ureteroscopic stone extraction  03-13-2009  . Left ureteroscopic laser lithotripsy stone extraction w/ stent placement  03-09-2011;   09-18-2009  . Left ureteroscopic stone extraction w/ stent  09-25-2009  . Posterior fusion lumbar spine  09-08-2010    Re-do Laminectomy L3-S1 w/  Decompression and fusion   . Lumbar laminectomy  x2  ----  L3 - L5//  L5 -- S1  . Anterior cervical decomp/discectomy fusion  C5--C7  06-05-2002//  C7--T1   03-29-2006  . Transthoracic echocardiogram  07-04-2007    Mild focal basal septal hypertrophy/  ef 60%/  mild MVP involving anterior and posterior leaflets w/ trivial MR/  trivial pericardial effusion posterior to the heart  . Cystoscopy with ureteroscopy, stone basketry and stent placement Bilateral 02/20/2015    Procedure: CYSTOSCOPY WITH BILATERAL URETEROSCOPY, STONE EXTRACTION  WITH LASER  AND STENT PLACEMENT;  Surgeon: Irine Seal, MD;  Location: [facility];  Service: Urology;  Laterality: Bilateral;  . Holmium laser application N/A 2/99/3716    Procedure: HOLMIUM LASER APPLICATION;  Surgeon: Irine Seal, MD;  Location: [facility];  Service: Urology;  Laterality: N/A;  . Laparoscopic cholecystectomy single port N/A 04/19/2015    Procedure: LAPAROSCOPIC CHOLECYSTECTOMY;  Surgeon: Florene Glen, MD;  Location: [facility];  Service: General;  Laterality: N/A;  . Colonoscopy with propofol N/A 06/27/2015    Procedure: COLONOSCOPY WITH PROPOFOL;  Surgeon: Lucilla Lame, MD;  Location: [facility];  Service: Endoscopy;  Laterality: N/A;    SOCIAL HISTORY:   Social History  Substance Use Topics  . Smoking status: Former Smoker    Types: Pipe    Quit date: 02/17/2000  . Smokeless tobacco: Never Used  . Alcohol Use: 0.6 oz/week    1 Shots of liquor per week     Comment: occasionally     FAMILY HISTORY:   Family History  Problem Relation Age of Onset  . Cancer Mother   . Diabetes Mother   . Hypertension Mother   . Stroke Mother   . Colon cancer Mother   . Hypertension Brother   . Colon cancer Brother     Pre-cancerous Polyps removed  . Kidney disease Brother     Chronic- secondary to Hypertension  . Stroke Father   . Thyroid disease Daughter   . Colon cancer Maternal Aunt     x 2  . Colon cancer Maternal Uncle     DRUG ALLERGIES:   Allergies  Allergen Reactions  . Cheese Other (See Comments)    cramps  . Codeine Itching  . Lactose Intolerance (Gi) Other (See Comments)    GI distress    MEDICATIONS AT HOME:   Prior to Admission medications   Medication Sig Start Date End Date Taking? Authorizing Provider  diazepam (VALIUM) 5 MG tablet Take 1 tablet (5 mg total) by mouth every 8 (eight) hours as needed for anxiety. 05/05/15 05/04/16  Loney Hering, MD  HYDROmorphone (DILAUDID) 2 MG tablet Take 1 tablet (2  mg total) by mouth every 4 (four) hours as needed for moderate pain or severe pain. 02/20/15   Irine Seal, MD  Na Sulfate-K Sulfate-Mg Sulf (SUPREP BOWEL PREP) SOLN Take 1 kit by mouth as directed. Please take the first bottle at 5:00PM the day before procedure and the second bottle 4 hours prior to your procedure. 05/29/15   Florene Glen, MD  naproxen sodium (ANAPROX) 220 MG tablet Take 220-440 mg by mouth once as needed.    Historical Provider, MD  omeprazole (PRILOSEC OTC) 20 MG tablet Take 20 mg by mouth every morning.     Historical Provider, MD  potassium citrate (UROCIT-K) 10 MEQ (1080 MG) SR tablet Take 10 mEq by mouth 2 (two) times daily.  04/23/15   Historical Provider, MD  promethazine (PHENERGAN) 25 MG tablet Take 1 tablet (25 mg total) by mouth every 6 (six) hours as needed for nausea or vomiting. 02/20/15   Irine Seal, MD  tamsulosin (FLOMAX) 0.4 MG CAPS capsule 1  capsule. 05/05/15   Historical Provider, MD    REVIEW OF SYSTEMS:  Review of Systems  Constitutional: Negative for fever, chills, weight loss and malaise/fatigue.  HENT: Negative for ear pain, hearing loss and tinnitus.   Eyes: Negative for blurred vision, double vision, pain and redness.  Respiratory: Negative for cough, hemoptysis and shortness of breath.   Cardiovascular: Negative for chest pain, palpitations, orthopnea and leg swelling.  Gastrointestinal: Positive for nausea and abdominal pain. Negative for vomiting, diarrhea and constipation.  Genitourinary: Negative for dysuria, frequency and hematuria.  Musculoskeletal: Negative for back pain, joint pain and neck pain.  Skin:       No acne, rash, or lesions  Neurological: Negative for dizziness, tremors, focal weakness and weakness.  Endo/Heme/Allergies: Negative for polydipsia. Does not bruise/bleed easily.  Psychiatric/Behavioral: Negative for depression. The patient is not nervous/anxious and does not have insomnia.      VITAL SIGNS:   Filed Vitals:    09/18/15 0103 09/18/15 0239  BP:  142/96  Pulse:  74  Resp:  20  Height: 5\' 10"  (1.778 m)   Weight: 81.647 kg (180 lb)   SpO2:  99%   Wt Readings from Last 3 Encounters:  09/18/15 81.647 kg (180 lb)  06/27/15 74.844 kg (165 lb)  05/29/15 77.111 kg (170 lb)    PHYSICAL EXAMINATION:  Physical Exam  Vitals reviewed. Constitutional: He is oriented to person, place, and time. He appears well-developed and well-nourished. No distress.  HENT:  Head: Normocephalic and atraumatic.  Mouth/Throat: Oropharynx is clear and moist.  Eyes: Conjunctivae and EOM are normal. Pupils are equal, round, and reactive to light. No scleral icterus.  Neck: Normal range of motion. Neck supple. No JVD present. No thyromegaly present.  Cardiovascular: Normal rate, regular rhythm and intact distal pulses.  Exam reveals no gallop and no friction rub.   No murmur heard. Respiratory: Effort normal and breath sounds normal. No respiratory distress. He has no wheezes. He has no rales.  GI: Soft. Bowel sounds are normal. He exhibits no distension. There is tenderness (epigastric).  Musculoskeletal: Normal range of motion. He exhibits no edema.  No arthritis, no gout  Lymphadenopathy:    He has no cervical adenopathy.  Neurological: He is alert and oriented to person, place, and time. No cranial nerve deficit.  No dysarthria, no aphasia  Skin: Skin is warm and dry. No rash noted. No erythema.  Psychiatric: He has a normal mood and affect. His behavior is normal. Judgment and thought content normal.    LABORATORY PANEL:   CBC  Recent Labs Lab 09/18/15 0111  WBC 8.4  HGB 15.9  HCT 48.6  PLT 162   ------------------------------------------------------------------------------------------------------------------  Chemistries   Recent Labs Lab 09/18/15 0111  NA 138  K 4.0  CL 106  CO2 26  GLUCOSE 106*  BUN 19  CREATININE 1.52*  CALCIUM 9.5  AST 200*  ALT 136*  ALKPHOS 218*  BILITOT 1.9*    ------------------------------------------------------------------------------------------------------------------  Cardiac Enzymes  Recent Labs Lab 09/18/15 0111  TROPONINI <0.03   ------------------------------------------------------------------------------------------------------------------  RADIOLOGY:  No results found.  EKG:   Orders placed or performed during the hospital encounter of 09/18/15  . ED EKG  . ED EKG  . EKG 12-Lead  . EKG 12-Lead    IMPRESSION AND PLAN:  Principal Problem:   Hyperbilirubinemia - concern for possible gallstone. MRCP recommended by gastroenterology. Same test was ordered, and gastroenterology consult. Active Problems:   Epigastric abdominal pain - possibly related to gallstone.  See treatment above.   Elevated transaminase level - see above   CKD (chronic kidney disease), stage III - avoid nephrotoxins, monitor closely, at baseline   BPH (benign prostatic hyperplasia) - continue home meds   GERD (gastroesophageal reflux disease) - home dose PPI  All the records are reviewed and case discussed with ED provider. Management plans discussed with the patient and/or family.  DVT PROPHYLAXIS: SubQ lovenox  GI PROPHYLAXIS: PPI  ADMISSION STATUS: Inpatient  CODE STATUS: Full Code Status History    Date Active Date Inactive Code Status Order ID Comments User Context   04/19/2015  2:05 AM 04/22/2015  8:27 PM Full Code [id number]  Sherri Rad, MD Inpatient   02/04/2015 11:12 PM 02/05/2015  1:10 PM Full Code [id number]  Carolan Clines, MD Inpatient      TOTAL TIME TAKING CARE OF THIS PATIENT: 45 minutes.    Trisa Cranor [place] 09/18/2015, 3:37 AM  [facility]  Office  [phone]  CC: Primary care physician; Leonard Downing, MD

## 2015-09-18 NOTE — ED Provider Notes (Signed)
[facility] Emergency Department Provider Note  ____________________________________________  Time seen: Approximately 2:46 AM  I have reviewed the triage vital signs and the nursing notes.   HISTORY  Chief Complaint Abdominal Pain    HPI Anthony [name] is a 65 y.o. male with a history of gangrenous cholecystitis approximately 5 months ago requiring urgent surgery who presents with episodic epigastric pain that has become much worse over the last 2 days.  The patient states that Dr. Burt Knack took out his gallbladder 5 months ago and told him that he may have a retained stone.  However, the patient followed up multiple times with Dr. Burt Knack who reportedly felt that the patient did not need an ERCP at that time and they agreed that the patient should go to the emergency department if he becomes acutely worse.  The patient reports intermittent epigastric pain for months.  However, it became acutely worse 1-2 days ago and he describes it as severe cramping and sharp pain in the epigastrium right underneath his diaphragm.  However completely resolved on its own and he has no pain at this time.  He denies nausea/vomiting, chest pain, shortness of breath, and any other abdominal pain.  Of note the patient had a surveillance colonoscopy by Dr. Allen Norris about 2 months ago but he did not have an endoscopy or ERCP at the time.   Past Medical History  Diagnosis Date  . GERD (gastroesophageal reflux disease)   . H/O mitral valve prolapse 2008 per echo    asymptomatic and does not see cardiologist.   . Hematuria   . Renal calculi     left  . Bilateral ureteral calculi   . History of kidney stones   . Renal insufficiency   . Chronic back pain   . 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 Arthritis     hands  . DDD (degenerative disc disease), lumbosacral     Patient Active Problem List   Diagnosis Date Noted  . Elevated  transaminase level 09/18/2015  . Hyperbilirubinemia 09/18/2015  . GERD (gastroesophageal reflux disease) 09/18/2015  . BPH (benign prostatic hyperplasia) 09/18/2015  . Special screening for malignant neoplasms, colon   . Second degree hemorrhoids   . Acute cholecystitis 04/19/2015  . Cholecystitis   . Epigastric abdominal pain   . Right ureteral calculus 02/05/2015  . Left ureteral calculus 02/05/2015  . CKD (chronic kidney disease), stage III 02/05/2015  . Ureteral stone 02/04/2015    Past Surgical History  Procedure Laterality Date  . Biceps tendon repair Right 2004 approx  . Cystoscopy with retrograde pyelogram, ureteroscopy and stent placement Right 01/24/2014    Procedure: CYSTOSCOPY WITH RETROGRADE PYELOGRAM, URETEROSCOPY AND STENT PLACEMENT, holmium laser, stone extraction;  Surgeon: Arvil Persons, MD;  Location: [facility];  Service: Urology;  Laterality: Right;  . Cystoscopy with retrograde pyelogram, ureteroscopy and stent placement Right 02/07/2014    Procedure: RIGHT URETEROSCOPY, STONE EXTRACTION AND POSSIBLE STENT PLACEMENT;  Surgeon: Malka So, MD;  Location: [facility];  Service: Urology;  Laterality: Right;  . Holmium laser application Right 8/52/7782    Procedure: HOLMIUM LASER APPLICATION;  Surgeon: Malka So, MD;  Location: [facility];  Service: Urology;  Laterality: Right;  . Cystoscopy with retrograde pyelogram, ureteroscopy and stent placement Bilateral 02/04/2015    Procedure: CYSTOSCOPY WITH RETROGRADE PYELOGRAM, URETEROSCOPY AND STENT PLACEMENT, BILATERAL;  Surgeon:  Carolan Clines, MD;  Location: [facility];  Service: Urology;  Laterality: Bilateral;  . Extracorporeal shock wave lithotripsy  Left 02-13-2015//  Right 01-17-2014  . Cysto/  right retrograde pyelogram/ ureteroscopy attempted stone manipulation/  right stent placement  03-03-2009  . Right ureteroscopic stone extraction  03-13-2009  . Left ureteroscopic  laser lithotripsy stone extraction w/ stent placement  03-09-2011;   09-18-2009  . Left ureteroscopic stone extraction w/ stent  09-25-2009  . Posterior fusion lumbar spine  09-08-2010    Re-do Laminectomy L3-S1 w/  Decompression and fusion   . Lumbar laminectomy  x2  ----  L3 - L5//  L5 -- S1  . Anterior cervical decomp/discectomy fusion  C5--C7  06-05-2002//  C7--T1   03-29-2006  . Transthoracic echocardiogram  07-04-2007    Mild focal basal septal hypertrophy/  ef 60%/  mild MVP involving anterior and posterior leaflets w/ trivial MR/  trivial pericardial effusion posterior to the heart  . Cystoscopy with ureteroscopy, stone basketry and stent placement Bilateral 02/20/2015    Procedure: CYSTOSCOPY WITH BILATERAL URETEROSCOPY, STONE EXTRACTION WITH LASER  AND STENT PLACEMENT;  Surgeon: Irine Seal, MD;  Location: [facility];  Service: Urology;  Laterality: Bilateral;  . Holmium laser application N/A 8/85/0277    Procedure: HOLMIUM LASER APPLICATION;  Surgeon: Irine Seal, MD;  Location: [facility];  Service: Urology;  Laterality: N/A;  . Laparoscopic cholecystectomy single port N/A 04/19/2015    Procedure: LAPAROSCOPIC CHOLECYSTECTOMY;  Surgeon: Florene Glen, MD;  Location: [facility];  Service: General;  Laterality: N/A;  . Colonoscopy with propofol N/A 06/27/2015    Procedure: COLONOSCOPY WITH PROPOFOL;  Surgeon: Lucilla Lame, MD;  Location: [facility];  Service: Endoscopy;  Laterality: N/A;    Current Outpatient Rx  Name  Route  Sig  Dispense  Refill  . diazepam (VALIUM) 5 MG tablet   Oral   Take 1 tablet (5 mg total) by mouth every 8 (eight) hours as needed for anxiety.   12 tablet   0   . HYDROmorphone (DILAUDID) 2 MG tablet   Oral   Take 1 tablet (2 mg total) by mouth every 4 (four) hours as needed for moderate pain or severe pain.   30 tablet   0   . Na Sulfate-K Sulfate-Mg Sulf (SUPREP BOWEL PREP) SOLN   Oral   Take 1 kit by mouth as  directed. Please take the first bottle at 5:00PM the day before procedure and the second bottle 4 hours prior to your procedure.   1 Bottle   0   . naproxen sodium (ANAPROX) 220 MG tablet   Oral   Take 220-440 mg by mouth once as needed.         Marland Kitchen omeprazole (PRILOSEC OTC) 20 MG tablet   Oral   Take 20 mg by mouth every morning.          . potassium citrate (UROCIT-K) 10 MEQ (1080 MG) SR tablet   Oral   Take 10 mEq by mouth 2 (two) times daily.       3   . promethazine (PHENERGAN) 25 MG tablet   Oral   Take 1 tablet (25 mg total) by mouth every 6 (six) hours as needed for nausea or vomiting.   20 tablet   1   . tamsulosin (FLOMAX) 0.4 MG CAPS capsule      1 capsule.      0     Allergies Cheese; Codeine; and  Lactose intolerance (gi)  Family History  Problem Relation Age of Onset  . Cancer Mother   . Diabetes Mother   . Hypertension Mother   . Stroke Mother   . Colon cancer Mother   . Hypertension Brother   . Colon cancer Brother     Pre-cancerous Polyps removed  . Kidney disease Brother     Chronic- secondary to Hypertension  . Stroke Father   . Thyroid disease Daughter   . Colon cancer Maternal Aunt     x 2  . Colon cancer Maternal Uncle     Social History Social History  Substance Use Topics  . Smoking status: Former Smoker    Types: Pipe    Quit date: 02/17/2000  . Smokeless tobacco: Never Used  . Alcohol Use: 0.6 oz/week    1 Shots of liquor per week     Comment: occasionally     Review of Systems Constitutional: No fever/chills Eyes: No visual changes. ENT: No sore throat. Cardiovascular: Denies chest pain. Respiratory: Denies shortness of breath. Gastrointestinal: Severe sharp epigastric pain, now resolved.  No nausea, vomiting, or diarrhea Genitourinary: Negative for dysuria. Musculoskeletal: Negative for back pain. Skin: Negative for rash. Neurological: Negative for headaches, focal weakness or numbness.  10-point ROS otherwise  negative.  ____________________________________________   PHYSICAL EXAM:  VITAL SIGNS: ED Triage Vitals  Enc Vitals Group     BP 09/18/15 0239 142/96 mmHg     Pulse Rate 09/18/15 0239 74     Resp 09/18/15 0239 20     Temp --      Temp src --      SpO2 09/18/15 0239 99 %     Weight 09/18/15 0103 180 lb (81.647 kg)     Height 09/18/15 0103 '5\' 10"'$  (1.778 m)     Head Cir --      Peak Flow --      Pain Score 09/18/15 0103 8     Pain Loc --      Pain Edu? --      Excl. in [place]? --     Constitutional: Alert and oriented. Well appearing and in no acute distress. Eyes: Conjunctivae are normal. PERRL. EOMI. Head: Atraumatic. Nose: No congestion/rhinnorhea. Mouth/Throat: Mucous membranes are moist.  Oropharynx non-erythematous. Neck: No stridor.   Cardiovascular: Normal rate, regular rhythm. Grossly normal heart sounds.  Good peripheral circulation. Respiratory: Normal respiratory effort.  No retractions. Lungs CTAB. Gastrointestinal: Soft with mild to moderate tenderness of his epigastrium but no tenderness in the right upper quadrant, negative Murphy sign, and no lower abdominal tenderness including no tenderness at McBurney's point.  No abdominal bruits or pulsatile masses. Musculoskeletal: No lower extremity tenderness nor edema.  No joint effusions. Neurologic:  Normal speech and language. No gross focal neurologic deficits are appreciated.  Skin:  Skin is warm, dry and intact. No rash noted. Psychiatric: Mood and affect are normal. Speech and behavior are normal.  ____________________________________________   LABS (all labs ordered are listed, but only abnormal results are displayed)  Labs Reviewed  LIPASE, BLOOD - Abnormal; Notable for the following:    Lipase 66 (*)    All other components within normal limits  COMPREHENSIVE METABOLIC PANEL - Abnormal; Notable for the following:    Glucose, Bld 106 (*)    Creatinine, Ser 1.52 (*)    AST 200 (*)    ALT 136 (*)     Alkaline Phosphatase 218 (*)    Total Bilirubin 1.9 (*)  GFR calc non Af Amer 47 (*)    GFR calc Af Amer 55 (*)    All other components within normal limits  URINALYSIS COMPLETEWITH MICROSCOPIC ([facility] ONLY) - Abnormal; Notable for the following:    Color, Urine YELLOW (*)    APPearance CLEAR (*)    Squamous Epithelial / LPF 0-5 (*)    All other components within normal limits  CBC  TROPONIN I   ____________________________________________  EKG  ED ECG REPORT I, Rether Rison, the attending physician, personally viewed and interpreted this ECG.  Date: 09/18/2015 EKG Time: 01:06 Rate: 71 Rhythm: normal sinus rhythm QRS Axis: normal Intervals: normal ST/T Wave abnormalities: normal Conduction Disutrbances: none Narrative Interpretation: unremarkable without evidence of acute ischemia  ____________________________________________  RADIOLOGY   No results found.  ____________________________________________   PROCEDURES  Procedure(s) performed: None  Critical Care performed: No ____________________________________________   INITIAL IMPRESSION / ASSESSMENT AND PLAN / ED COURSE  Pertinent labs & imaging results that were available during my care of the patient were reviewed by me and considered in my medical decision making (see chart for details).  The patient has mild to moderate transaminitis and a total bilirubin of 1.9.  All these are notable elevation since his last labs prior to his surgery.  I am concerned about the possibility of a retained stone/choledocholithiasis.  I called and spoke with phone with Dr. Candace Cruise who agreed with my assessment and concern.  We discussed the possibility of outpatient follow-up but we both agree that the patient runs the risk of becoming much more acutely ill if we do not urgently act upon his symptoms.  He recommended inpatient admission with an MRCP and GI consult to determine the need for an ERCP.  I discussed this with the patient  understands and agrees.  He continues to be without pain at this time.  ____________________________________________  FINAL CLINICAL IMPRESSION(S) / ED DIAGNOSES  Final diagnoses:  Calculus of bile duct with obstruction and without cholangitis or cholecystitis      NEW MEDICATIONS STARTED DURING THIS VISIT:  New Prescriptions   No medications on file     Hinda Kehr, MD 09/18/15 [phone]

## 2015-09-18 NOTE — Progress Notes (Signed)
Per Dr. Allena Katz place pt on regular diet.

## 2015-09-18 NOTE — ED Notes (Signed)
Dr Anne Hahn in to see pt; pt uprite on stretcher with no c/o; wife at bedside

## 2015-09-19 LAB — COMPREHENSIVE METABOLIC PANEL
ALT: 126 U/L — ABNORMAL HIGH (ref 17–63)
AST: 77 U/L — ABNORMAL HIGH (ref 15–41)
Albumin: 3.5 g/dL (ref 3.5–5.0)
Alkaline Phosphatase: 185 U/L — ABNORMAL HIGH (ref 38–126)
Anion gap: 7 (ref 5–15)
BUN: 20 mg/dL (ref 6–20)
CO2: 23 mmol/L (ref 22–32)
Calcium: 8.6 mg/dL — ABNORMAL LOW (ref 8.9–10.3)
Chloride: 104 mmol/L (ref 101–111)
Creatinine, Ser: 1.41 mg/dL — ABNORMAL HIGH (ref 0.61–1.24)
GFR calc Af Amer: 60 mL/min — ABNORMAL LOW (ref >60)
GFR calc non Af Amer: 52 mL/min — ABNORMAL LOW (ref >60)
Glucose, Bld: 96 mg/dL (ref 65–99)
Potassium: 4.1 mmol/L (ref 3.5–5.1)
Sodium: 134 mmol/L — ABNORMAL LOW (ref 135–145)
Total Bilirubin: 1.3 mg/dL — ABNORMAL HIGH (ref 0.3–1.2)
Total Protein: 6.2 g/dL — ABNORMAL LOW (ref 6.5–8.1)

## 2015-09-19 LAB — HEPATITIS B SURFACE ANTIGEN: Hepatitis B Surface Ag: NEGATIVE

## 2015-09-19 NOTE — Progress Notes (Signed)
09/19/2015  10:46  Anthony [name] to be D/C'd Home per MD order.  Discussed prescriptions and follow up appointments with the patient. Prescriptions given to patient, medication list explained in detail. Pt verbalized understanding.    Medication List    STOP taking these medications        naproxen sodium 220 MG tablet  Commonly known as:  ANAPROX      TAKE these medications        diazepam 5 MG tablet  Commonly known as:  VALIUM  Take 1 tablet (5 mg total) by mouth every 8 (eight) hours as needed for anxiety.     HYDROmorphone 2 MG tablet  Commonly known as:  DILAUDID  Take 1 tablet (2 mg total) by mouth every 4 (four) hours as needed for moderate pain or severe pain.     omeprazole 20 MG tablet  Commonly known as:  PRILOSEC OTC  Take 20 mg by mouth every morning.        Filed Vitals:   09/18/15 2030 09/19/15 0525  BP: 112/61 111/69  Pulse: 88 84  Temp: 97.9 F (36.6 C) 98 F (36.7 C)  Resp: 20 20    Skin clean, dry and intact without evidence of skin break down, no evidence of skin tears noted. IV catheter discontinued intact. Site without signs and symptoms of complications. Dressing and pressure applied. Pt denies pain at this time. No complaints noted.  An After Visit Summary was printed and given to the patient. Patient escorted via WC, and D/C home via private auto.  Bradly Chris

## 2015-09-19 NOTE — Discharge Summary (Signed)
[facility] [place] [facility]   PATIENT NAME: Anthony [name]    MR#:  [id number]  DATE OF BIRTH:  [birth year]  DATE OF ADMISSION:  09/18/2015 ADMITTING PHYSICIAN: Oralia Manis, MD  DATE OF DISCHARGE: 09/19/15  PRIMARY CARE PHYSICIAN: [name], MD    ADMISSION DIAGNOSIS:  Calculus of bile duct with obstruction and without cholangitis or cholecystitis [K80.51]  DISCHARGE DIAGNOSIS:  elevated Transaminases with unclear etiology GERD  SECONDARY DIAGNOSIS:   Past Medical History  Diagnosis Date  . GERD (gastroesophageal reflux disease)   . H/O mitral valve prolapse 2008 per echo    asymptomatic and does not see cardiologist.   . Hematuria   . Renal calculi     left  . Bilateral ureteral calculi   . History of kidney stones   . Renal insufficiency   . Chronic back pain   . 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 Arthritis     hands  . DDD (degenerative disc disease), lumbosacral     HOSPITAL COURSE:   Anthony [name] is a 65 y.o. male who presents with epigastric abdominal pain. Patient recently had cholecystectomy. At that time it was felt he might have had a dilated common bile duct. Subsequent workup for possible retained bile stone was initially felt to be warranted, but later was not pursued.  1.Hyperbilirubinemia with abnormal LFTs - etiology unclear at present. - Negative for dilated CBD -Spoke with GI dr Servando Snare.  Maryclare Labrador order hepatitis panel, alpha-1 antitrypsin, SMA, ferritin, iron levels, ceruloplasmin -pt may need outpt liver biospy.  Doing well  2. Epigastric abdominal pain - See treatment above.  3.  Elevated transaminase level - see above  4. CKD (chronic kidney disease), stage III - avoid nephrotoxins, monitor closely, at baseline   5. BPH (benign prostatic hyperplasia) - continue home meds  6. GERD (gastroesophageal reflux disease) - home dose  PPI  Overall improved. D/c home with outpt f/u with GI CONSULTS OBTAINED:  Treatment Team:  Midge Minium, MD  DRUG ALLERGIES:   Allergies  Allergen Reactions  . Cheese Other (See Comments)    cramps  . Codeine Itching  . Lactose Intolerance (Gi) Other (See Comments)    GI distress    DISCHARGE MEDICATIONS:   Current Discharge Medication List    CONTINUE these medications which have NOT CHANGED   Details  diazepam (VALIUM) 5 MG tablet Take 1 tablet (5 mg total) by mouth every 8 (eight) hours as needed for anxiety. Qty: 12 tablet, Refills: 0    HYDROmorphone (DILAUDID) 2 MG tablet Take 1 tablet (2 mg total) by mouth every 4 (four) hours as needed for moderate pain or severe pain. Qty: 30 tablet, Refills: 0    omeprazole (PRILOSEC OTC) 20 MG tablet Take 20 mg by mouth every morning.       STOP taking these medications     naproxen sodium (ANAPROX) 220 MG tablet      tamsulosin (FLOMAX) 0.4 MG CAPS capsule         If you experience worsening of your admission symptoms, develop shortness of breath, life threatening emergency, suicidal or homicidal thoughts you must seek medical attention immediately by calling 911 or calling your MD immediately  if symptoms less severe.  You Must read complete instructions/literature along with all the possible adverse reactions/side effects for all the Medicines you take and that have been prescribed to you.  Take any new Medicines after you have completely understood and accept all the possible adverse reactions/side effects.   Please note  You were cared for by a hospitalist during your hospital stay. If you have any questions about your discharge medications or the care you received while you were in the hospital after you are discharged, you can call the unit and asked to speak with the hospitalist on call if the hospitalist that took care of you is not available. Once you are discharged, your primary care physician will handle any  further medical issues. Please note that NO REFILLS for any discharge medications will be authorized once you are discharged, as it is imperative that you return to your primary care physician (or establish a relationship with a primary care physician if you do not have one) for your aftercare needs so that they can reassess your need for medications and monitor your lab values. Today   SUBJECTIVE   Doing well  VITAL SIGNS:  Blood pressure 111/69, pulse 84, temperature 98 F (36.7 C), temperature source Oral, resp. rate 20, height  (1.778 m), weight 79.334 kg (174 lb 14.4 oz), SpO2 97 %.  I/O:   Intake/Output Summary (Last 24 hours) at 09/19/15 0914 Last data filed at 09/19/15 0500  Gross per 24 hour  Intake    240 ml  Output    400 ml  Net   -160 ml    PHYSICAL EXAMINATION:  GENERAL:  65 y.o.-year-old patient lying in the bed with no acute distress.  EYES: Pupils equal, round, reactive to light and accommodation. No scleral icterus. Extraocular muscles intact.  HEENT: Head atraumatic, normocephalic. Oropharynx and nasopharynx clear.  NECK:  Supple, no jugular venous distention. No thyroid enlargement, no tenderness.  LUNGS: Normal breath sounds bilaterally, no wheezing, rales,rhonchi or crepitation. No use of accessory muscles of respiration.  CARDIOVASCULAR: S1, S2 normal. No murmurs, rubs, or gallops.  ABDOMEN: Soft, non-tender, non-distended. Bowel sounds present. No organomegaly or mass.  EXTREMITIES: No pedal edema, cyanosis, or clubbing.  NEUROLOGIC: Cranial nerves II through XII are intact. Muscle strength 5/5 in all extremities. Sensation intact. Gait not checked.  PSYCHIATRIC: The patient is alert and oriented x 3.  SKIN: No obvious rash, lesion, or ulcer.   DATA REVIEW:   CBC   Recent Labs Lab 09/18/15 0111  WBC 8.4  HGB 15.9  HCT 48.6  PLT 162    Chemistries   Recent Labs Lab 09/19/15 0417  NA 134*  K 4.1  CL 104  CO2 23  GLUCOSE 96  BUN 20   CREATININE 1.41*  CALCIUM 8.6*  AST 77*  ALT 126*  ALKPHOS 185*  BILITOT 1.3*    Microbiology Results   No results found for this or any previous visit (from the past 240 hour(s)).  RADIOLOGY:  Mr Anthony [name] W/wo Cm/mrcp  09/18/2015  CLINICAL DATA:  Severe epigastric pain following cholecystectomy 4 months ago. Evaluate for possible retained common bile duct stone. EXAM: MRI ABDOMEN WITHOUT AND WITH CONTRAST (INCLUDING MRCP) TECHNIQUE: Multiplanar multisequence MR imaging of the abdomen was performed both before and after the administration of intravenous contrast. Heavily T2-weighted images of the biliary and pancreatic ducts were obtained, and three-dimensional MRCP images were rendered by post processing. CONTRAST:  16mL MULTIHANCE GADOBENATE DIMEGLUMINE 529 MG/ML IV SOLN COMPARISON:  CT scan 05/05/2015 FINDINGS: Examination is limited by breathing motion artifact. Lower chest: No pleural or pericardial effusion. Moderate to large hiatal hernia. No worrisome pulmonary lesions. Hepatobiliary: No  focal hepatic lesions or intrahepatic biliary dilatation. The gallbladder is surgically absent. Normal caliber and course of the common bile duct. No dilatation or obstructing common bile duct stones. No obvious pancreatic mass or ampullary lesion. Pancreas: No mass, inflammation or ductal dilatation. Spleen: Normal size.  No focal lesions. Adrenals/Urinary Tract: The adrenal glands are normal. Chronic bilateral hydronephrosis. No worrisome renal lesions. Stomach/Bowel: The stomach, duodenum, visualized small bowel and visualized colon are grossly normal. Vascular/Lymphatic: No mesenteric or retroperitoneal mass or adenopathy. The aorta and branch vessels are patent. The major venous structures are patent. Other: No ascites or abdominal wall hernia. Musculoskeletal: No significant bony findings. Lumbar fusion hardware noted. IMPRESSION: 1. Normal caliber and course of the common bile duct and pancreatic duct. No  common bile duct stones, pancreatic head mass or ampullary lesion. 2. Status post cholecystectomy.  No intrahepatic biliary dilatation. 3. Chronic bilateral hydronephrosis. Electronically Signed   By: Rudie Meyer M.D.   On: 09/18/2015 13:17     Management plans discussed with the patient, family and they are in agreement.  CODE STATUS:     Code Status Orders        Start     Ordered   09/18/15 0459  Full code   Continuous     09/18/15 0458    Code Status History    Date Active Date Inactive Code Status Order ID Comments User Context   04/19/2015  2:05 AM 04/22/2015  8:27 PM Full Code [id number]  Natale Lay, MD Inpatient   02/04/2015 11:12 PM 02/05/2015  1:10 PM Full Code [id number]  Jethro Bolus, MD Inpatient      TOTAL TIME TAKING CARE OF THIS PATIENT: 40 minutes.    Sahira Cataldi M.D on 09/19/2015 at 9:14 AM  Between 7am to 6pm - Pager - [phone] After 6pm go to [email or web address] - password [email or web address] [facility]  [facility]  Office  [phone]  CC: Primary care physician; [name], MD

## 2015-09-22 LAB — ANTI-SMOOTH MUSCLE ANTIBODY, IGG: F-Actin IgG: 6 Units (ref 0–19)

## 2015-09-22 LAB — HEPATITIS A ANTIBODY, TOTAL: Hep A Total Ab: NEGATIVE

## 2015-09-22 LAB — ANA COMPREHENSIVE PANEL

## 2015-09-22 LAB — MITOCHONDRIAL ANTIBODIES: Mitochondrial M2 Ab, IgG: 2.5 Units (ref 0.0–20.0)

## 2015-09-22 LAB — CERULOPLASMIN: Ceruloplasmin: 22.2 mg/dL (ref 16.0–31.0)

## 2015-09-22 LAB — HEPATITIS C ANTIBODY: HCV Ab: <0.1 s/co ratio (ref 0.0–0.9)

## 2015-09-22 LAB — ALPHA-1 ANTITRYPSIN PHENOTYPE: A-1 Antitrypsin, Ser: 146 mg/dL (ref 90–200)

## 2015-09-22 LAB — HEPATITIS B SURFACE ANTIBODY, QUANTITATIVE: Hepatitis B-Post: <3.1 m[IU]/mL — ABNORMAL LOW (ref >9.9)

## 2015-10-09 ENCOUNTER — Other Ambulatory Visit: Payer: Self-pay

## 2015-10-09 ENCOUNTER — Ambulatory Visit (INDEPENDENT_AMBULATORY_CARE_PROVIDER_SITE_OTHER): Payer: BLUE CROSS/BLUE SHIELD | Admitting: Gastroenterology

## 2015-10-09 ENCOUNTER — Encounter: Payer: Self-pay | Admitting: Gastroenterology

## 2015-10-09 VITALS — BP 117/76 | HR 98 | Temp 98.0°F | Ht 71.0 in | Wt 183.0 lb

## 2015-10-09 DIAGNOSIS — R7989 Other specified abnormal findings of blood chemistry: Secondary | ICD-10-CM

## 2015-10-09 DIAGNOSIS — R945 Abnormal results of liver function studies: Principal | ICD-10-CM

## 2015-10-09 NOTE — Progress Notes (Signed)
Primary Care Physician: [name], MD  Primary Gastroenterologist:  Dr. Midge Minium  Chief Complaint  Patient presents with  . Hospitalization Follow-up  . Abnormal liver enzymes    HPI: Anthony [name] is a 65 y.o. male here for follow-up after being discharged from the hospital. The patient was found to have abnormal liver enzymes in the hospital and for many months prior. The patient also was admitted to the hospital with abdominal pain. The patient on physical exam was found to have abdominal pain consistent with musculoskeletal pain. The patient states he has continued to have some of the pain since he was discharged but not as much as before. The pain is helped with her laxatives and sometimes he says it gets better with ginger ale. The patient also has had some nausea with the pain and reports weight loss.  Current Outpatient Prescriptions  Medication Sig Dispense Refill  . diazepam (VALIUM) 5 MG tablet Take 1 tablet (5 mg total) by mouth every 8 (eight) hours as needed for anxiety. 12 tablet 0  . HYDROmorphone (DILAUDID) 2 MG tablet Take 1 tablet (2 mg total) by mouth every 4 (four) hours as needed for moderate pain or severe pain. 30 tablet 0  . omeprazole (PRILOSEC OTC) 20 MG tablet Take 20 mg by mouth every morning.      No current facility-administered medications for this visit.    Allergies as of 10/09/2015 - Review Complete 10/09/2015  Allergen Reaction Noted  . Cheese Other (See Comments) 06/23/2015  . Codeine Itching 01/09/2014  . Lactose intolerance (gi) Other (See Comments) 06/23/2015    ROS:  General: Negative for anorexia, weight loss, fever, chills, fatigue, weakness. ENT: Negative for hoarseness, difficulty swallowing , nasal congestion. CV: Negative for chest pain, angina, palpitations, dyspnea on exertion, peripheral edema.  Respiratory: Negative for dyspnea at rest, dyspnea on exertion, cough, sputum, wheezing.  GI: See history of present  illness. GU:  Negative for dysuria, hematuria, urinary incontinence, urinary frequency, nocturnal urination.  Endo: Negative for unusual weight change.    Physical Examination:   BP 117/76 mmHg  Pulse 98  Temp(Src) 98 F (36.7 C) (Oral)  Ht  (1.803 m)  Wt 183 lb (83.008 kg)  BMI 25.53 kg/m2  General: Well-nourished, well-developed in no acute distress.  Eyes: No icterus. Conjunctivae pink. Mouth: Oropharyngeal mucosa moist and pink , no lesions erythema or exudate. Lungs: Clear to auscultation bilaterally. Non-labored. Heart: Regular rate and rhythm, no murmurs rubs or gallops.  Abdomen: Bowel sounds are normal, nontender, nondistended, no hepatosplenomegaly or masses, no abdominal bruits or hernia , no rebound or guarding.   Extremities: No lower extremity edema. No clubbing or deformities. Neuro: Alert and oriented x 3.  Grossly intact. Skin: Warm and dry, no jaundice.   Psych: Alert and cooperative, normal mood and affect.  Labs:    Imaging Studies: Mr Abd W/wo Cm/mrcp  09/18/2015  CLINICAL DATA:  Severe epigastric pain following cholecystectomy 4 months ago. Evaluate for possible retained common bile duct stone. EXAM: MRI ABDOMEN WITHOUT AND WITH CONTRAST (INCLUDING MRCP) TECHNIQUE: Multiplanar multisequence MR imaging of the abdomen was performed both before and after the administration of intravenous contrast. Heavily T2-weighted images of the biliary and pancreatic ducts were obtained, and three-dimensional MRCP images were rendered by post processing. CONTRAST:  16mL MULTIHANCE GADOBENATE DIMEGLUMINE 529 MG/ML IV SOLN COMPARISON:  CT scan 05/05/2015 FINDINGS: Examination is limited by breathing motion artifact. Lower chest: No pleural or pericardial effusion. Moderate to  large hiatal hernia. No worrisome pulmonary lesions. Hepatobiliary: No focal hepatic lesions or intrahepatic biliary dilatation. The gallbladder is surgically absent. Normal caliber and course of the common  bile duct. No dilatation or obstructing common bile duct stones. No obvious pancreatic mass or ampullary lesion. Pancreas: No mass, inflammation or ductal dilatation. Spleen: Normal size.  No focal lesions. Adrenals/Urinary Tract: The adrenal glands are normal. Chronic bilateral hydronephrosis. No worrisome renal lesions. Stomach/Bowel: The stomach, duodenum, visualized small bowel and visualized colon are grossly normal. Vascular/Lymphatic: No mesenteric or retroperitoneal mass or adenopathy. The aorta and branch vessels are patent. The major venous structures are patent. Other: No ascites or abdominal wall hernia. Musculoskeletal: No significant bony findings. Lumbar fusion hardware noted. IMPRESSION: 1. Normal caliber and course of the common bile duct and pancreatic duct. No common bile duct stones, pancreatic head mass or ampullary lesion. 2. Status post cholecystectomy.  No intrahepatic biliary dilatation. 3. Chronic bilateral hydronephrosis. Electronically Signed   By: Rudie Meyer M.D.   On: 09/18/2015 13:17    Assessment and Plan:   Anthony [name] is a 65 y.o. y/o male who comes in today with a history of abnormal liver enzymes for many months and abdominal pain. The patient also has nausea. The patient will be set up for a repeat liver enzyme test and will also be set up for an upper endoscopy. If the enzymes continue to be elevated the patient will be set up for a liver biopsy to look for a cause of the abnormal liver enzymes with the patient's blood workup not showing a cause up until now. The patient has been explained the plan and agrees with it.   Note: This dictation was prepared with Dragon dictation along with smaller phrase technology. Any transcriptional errors that result from this process are unintentional.

## 2015-10-10 ENCOUNTER — Other Ambulatory Visit: Payer: Self-pay

## 2015-10-10 ENCOUNTER — Encounter: Payer: Self-pay | Admitting: *Deleted

## 2015-10-10 ENCOUNTER — Telehealth: Payer: Self-pay

## 2015-10-10 LAB — HEPATIC FUNCTION PANEL
ALT: 72 IU/L — ABNORMAL HIGH (ref 0–44)
AST: 35 IU/L (ref 0–40)
Albumin: 4.2 g/dL (ref 3.6–4.8)
Alkaline Phosphatase: 252 IU/L — ABNORMAL HIGH (ref 39–117)
Bilirubin Total: 0.6 mg/dL (ref 0.0–1.2)
Bilirubin, Direct: 0.21 mg/dL (ref 0.00–0.40)
Total Protein: 6.9 g/dL (ref 6.0–8.5)

## 2015-10-10 NOTE — Telephone Encounter (Signed)
Pt notified of results. Contacted [facility] and added the fractionation of the alk phos. Also add GGT. Pt is aware of the 1 month repeat.

## 2015-10-10 NOTE — Telephone Encounter (Signed)
-----  Message from Lucilla Lame, MD sent at 10/10/2015  7:18 AM EST ----- Let the patient know that the liver tests are much better and one is back to normal.  Please see if they can fractionate the alk phos they have. If not then please have them draw fractionation and add a GGT. Recheck LFT's in 1 month.

## 2015-10-13 LAB — GAMMA GT: GGT: 332 IU/L — ABNORMAL HIGH (ref 0–65)

## 2015-10-13 LAB — SPECIMEN STATUS REPORT

## 2015-10-13 LAB — ALKALINE PHOSPHATASE, ISOENZYMES
Alkaline Phosphatase: 250 IU/L — ABNORMAL HIGH (ref 39–117)
BONE FRACTION: 24 % (ref 12–68)
INTESTINAL FRAC.: 4 % (ref 0–18)
LIVER FRACTION: 72 % (ref 13–88)

## 2015-10-14 ENCOUNTER — Telehealth: Payer: Self-pay

## 2015-10-14 NOTE — Telephone Encounter (Signed)
Pt notified of lab results. Reminder message has been sent to myself for pt to repeat in 1 month.

## 2015-10-14 NOTE — Telephone Encounter (Signed)
-----   Message from Midge Minium, MD sent at 10/14/2015  1:18 PM EST ----- That the patient know that despite the liver enzymes being better the alkaline phosphatase is still elevated. This could be from multiple factors including medications. We should recheck it in one month's time.

## 2015-10-17 ENCOUNTER — Ambulatory Visit: Payer: BLUE CROSS/BLUE SHIELD | Admitting: Anesthesiology

## 2015-10-17 ENCOUNTER — Encounter
Admission: RE | Disposition: A | Discharge status: Home / Self Care | Payer: Self-pay | Source: Ambulatory Visit | Attending: Gastroenterology

## 2015-10-17 ENCOUNTER — Ambulatory Visit
Admission: RE | Admit: 2015-10-17 | Discharge: 2015-10-17 | Disposition: A | Discharge status: Home / Self Care | Payer: BLUE CROSS/BLUE SHIELD | Source: Ambulatory Visit | Attending: Gastroenterology | Admitting: Gastroenterology

## 2015-10-17 DIAGNOSIS — Z841 Family history of disorders of kidney and ureter: Secondary | ICD-10-CM | POA: Diagnosis not present

## 2015-10-17 DIAGNOSIS — M19042 Primary osteoarthritis, left hand: Secondary | ICD-10-CM | POA: Insufficient documentation

## 2015-10-17 DIAGNOSIS — Z79899 Other long term (current) drug therapy: Secondary | ICD-10-CM | POA: Insufficient documentation

## 2015-10-17 DIAGNOSIS — Z8249 Family history of ischemic heart disease and other diseases of the circulatory system: Secondary | ICD-10-CM | POA: Insufficient documentation

## 2015-10-17 DIAGNOSIS — R200 Anesthesia of skin: Secondary | ICD-10-CM | POA: Insufficient documentation

## 2015-10-17 DIAGNOSIS — Z87891 Personal history of nicotine dependence: Secondary | ICD-10-CM | POA: Diagnosis not present

## 2015-10-17 DIAGNOSIS — Z823 Family history of stroke: Secondary | ICD-10-CM | POA: Diagnosis not present

## 2015-10-17 DIAGNOSIS — Z800 Family history of malignant neoplasm of digestive organs: Secondary | ICD-10-CM | POA: Insufficient documentation

## 2015-10-17 DIAGNOSIS — M19041 Primary osteoarthritis, right hand: Secondary | ICD-10-CM | POA: Diagnosis not present

## 2015-10-17 DIAGNOSIS — Z87442 Personal history of urinary calculi: Secondary | ICD-10-CM | POA: Insufficient documentation

## 2015-10-17 DIAGNOSIS — K449 Diaphragmatic hernia without obstruction or gangrene: Secondary | ICD-10-CM | POA: Diagnosis not present

## 2015-10-17 DIAGNOSIS — K219 Gastro-esophageal reflux disease without esophagitis: Secondary | ICD-10-CM | POA: Diagnosis not present

## 2015-10-17 DIAGNOSIS — Z809 Family history of malignant neoplasm, unspecified: Secondary | ICD-10-CM | POA: Diagnosis not present

## 2015-10-17 DIAGNOSIS — R110 Nausea: Secondary | ICD-10-CM | POA: Insufficient documentation

## 2015-10-17 DIAGNOSIS — N289 Disorder of kidney and ureter, unspecified: Secondary | ICD-10-CM | POA: Insufficient documentation

## 2015-10-17 DIAGNOSIS — Z833 Family history of diabetes mellitus: Secondary | ICD-10-CM | POA: Insufficient documentation

## 2015-10-17 DIAGNOSIS — G8929 Other chronic pain: Secondary | ICD-10-CM | POA: Insufficient documentation

## 2015-10-17 DIAGNOSIS — I341 Nonrheumatic mitral (valve) prolapse: Secondary | ICD-10-CM | POA: Insufficient documentation

## 2015-10-17 DIAGNOSIS — M549 Dorsalgia, unspecified: Secondary | ICD-10-CM | POA: Diagnosis not present

## 2015-10-17 DIAGNOSIS — M5137 Other intervertebral disc degeneration, lumbosacral region: Secondary | ICD-10-CM | POA: Diagnosis not present

## 2015-10-17 DIAGNOSIS — M1990 Unspecified osteoarthritis, unspecified site: Secondary | ICD-10-CM | POA: Insufficient documentation

## 2015-10-17 DIAGNOSIS — K2970 Gastritis, unspecified, without bleeding: Secondary | ICD-10-CM | POA: Diagnosis not present

## 2015-10-17 HISTORY — PX: ESOPHAGOGASTRODUODENOSCOPY (EGD) WITH PROPOFOL: SHX5813

## 2015-10-17 SURGERY — ESOPHAGOGASTRODUODENOSCOPY (EGD) WITH PROPOFOL
Anesthesia: Monitor Anesthesia Care

## 2015-10-17 MED ORDER — LACTATED RINGERS IV SOLN
INTRAVENOUS | Status: DC
  Administered 2015-10-17: 12:00:00 via INTRAVENOUS

## 2015-10-17 MED ORDER — GLYCOPYRROLATE 0.2 MG/ML IJ SOLN
INTRAMUSCULAR | Status: DC | PRN
  Administered 2015-10-17: 0.1 mg via INTRAVENOUS

## 2015-10-17 MED ORDER — SODIUM CHLORIDE 0.9 % IV SOLN: INTRAVENOUS | Status: DC

## 2015-10-17 MED ORDER — PROPOFOL 10 MG/ML IV BOLUS
INTRAVENOUS | Status: DC | PRN
  Administered 2015-10-17: 50 mg via INTRAVENOUS
  Administered 2015-10-17: 100 mg via INTRAVENOUS
  Administered 2015-10-17: 50 mg via INTRAVENOUS

## 2015-10-17 MED ORDER — LIDOCAINE HCL (CARDIAC) 20 MG/ML IV SOLN
INTRAVENOUS | Status: DC | PRN
  Administered 2015-10-17: 50 mg via INTRAVENOUS

## 2015-10-17 MED ORDER — STERILE WATER FOR IRRIGATION IR SOLN
Status: DC | PRN
  Administered 2015-10-17: 12:00:00

## 2015-10-17 SURGICAL SUPPLY — 39 items
BALLN DILATOR 10-12 8 (BALLOONS)
BALLN DILATOR 12-15 8 (BALLOONS)
BALLN DILATOR 15-18 8 (BALLOONS)
BALLN DILATOR CRE 0-12 8 (BALLOONS)
BALLN DILATOR ESOPH 8 10 CRE (MISCELLANEOUS) IMPLANT
BALLOON DILATOR 12-15 8 (BALLOONS) IMPLANT
BALLOON DILATOR 15-18 8 (BALLOONS) IMPLANT
BALLOON DILATOR CRE 0-12 8 (BALLOONS) IMPLANT
BLOCK BITE 60FR ADLT L/F GRN (MISCELLANEOUS) ×3 IMPLANT
CANISTER SUCT 1200ML W/VALVE (MISCELLANEOUS) ×3 IMPLANT
FCP ESCP3.2XJMB 240X2.8X (MISCELLANEOUS)
FORCEPS BIOP RAD 4 LRG CAP 4 (CUTTING FORCEPS) IMPLANT
FORCEPS BIOP RJ4 240 W/NDL (MISCELLANEOUS)
FORCEPS ESCP3.2XJMB 240X2.8X (MISCELLANEOUS) IMPLANT
GOWN CVR UNV OPN BCK APRN NK (MISCELLANEOUS) ×2 IMPLANT
GOWN ISOL THUMB LOOP REG UNIV (MISCELLANEOUS) ×4
HEMOCLIP INSTINCT (CLIP) IMPLANT
INJECTOR VARIJECT VIN23 (MISCELLANEOUS) IMPLANT
KIT CO2 TUBING (TUBING) IMPLANT
KIT DEFENDO VALVE AND CONN (KITS) IMPLANT
KIT ENDO PROCEDURE OLY (KITS) ×3 IMPLANT
LIGATOR MULTIBAND 6SHOOTER MBL (MISCELLANEOUS) IMPLANT
MARKER SPOT ENDO TATTOO 5ML (MISCELLANEOUS) IMPLANT
PAD GROUND ADULT SPLIT (MISCELLANEOUS) IMPLANT
SNARE SHORT THROW 13M SML OVAL (MISCELLANEOUS) ×3 IMPLANT
SNARE SHORT THROW 30M LRG OVAL (MISCELLANEOUS) IMPLANT
SPOT EX ENDOSCOPIC TATTOO (MISCELLANEOUS)
SUCTION POLY TRAP 4CHAMBER (MISCELLANEOUS) IMPLANT
SYR INFLATION 60ML (SYRINGE) IMPLANT
TRAP SUCTION POLY (MISCELLANEOUS) IMPLANT
TUBING CONN 6MMX3.1M (TUBING)
TUBING SUCTION CONN 0.25 STRL (TUBING) IMPLANT
UNDERPAD 30X60 958B10 (PK) (MISCELLANEOUS) IMPLANT
VALVE BIOPSY ENDO (VALVE) IMPLANT
VARIJECT INJECTOR VIN23 (MISCELLANEOUS)
WATER AUXILLARY (MISCELLANEOUS) IMPLANT
WATER STERILE IRR 250ML POUR (IV SOLUTION) ×3 IMPLANT
WATER STERILE IRR 500ML POUR (IV SOLUTION) IMPLANT
WIRE CRE 18-20MM 8CM F G (MISCELLANEOUS) IMPLANT

## 2015-10-17 NOTE — Discharge Instructions (Signed)

## 2015-10-17 NOTE — Anesthesia Procedure Notes (Signed)
Procedure Name: MAC Date/Time: 10/17/2015 12:13 PM Performed by: Maree Krabbe Pre-anesthesia Checklist: Patient identified, Emergency Drugs available, Suction available, Timeout performed and Patient being monitored Patient Re-evaluated:Patient Re-evaluated prior to inductionOxygen Delivery Method: Nasal cannula Placement Confirmation: positive ETCO2

## 2015-10-17 NOTE — Anesthesia Postprocedure Evaluation (Signed)
Anesthesia Post Note  Patient: DANIEL RITTHALER  Procedure(s) Performed: Procedure(s) (LRB): ESOPHAGOGASTRODUODENOSCOPY (EGD) WITH PROPOFOL (N/A)  Patient location during evaluation: PACU Anesthesia Type: MAC Level of consciousness: awake and alert Pain management: pain level controlled Vital Signs Assessment: post-procedure vital signs reviewed and stable Respiratory status: spontaneous breathing, nonlabored ventilation, respiratory function stable and patient connected to nasal cannula oxygen Cardiovascular status: blood pressure returned to baseline and stable Postop Assessment: no signs of nausea or vomiting Anesthetic complications: no    Halen Antenucci C

## 2015-10-17 NOTE — Transfer of Care (Signed)
Immediate Anesthesia Transfer of Care Note  Patient: Anthony [name]  Procedure(s) Performed: Procedure(s): ESOPHAGOGASTRODUODENOSCOPY (EGD) WITH PROPOFOL (N/A)  Patient Location: PACU  Anesthesia Type: General, MAC  Level of Consciousness: awake, alert  and patient cooperative  Airway and Oxygen Therapy: Patient Spontanous Breathing and Patient connected to supplemental oxygen  Post-op Assessment: Post-op Vital signs reviewed, Patient's Cardiovascular Status Stable, Respiratory Function Stable, Patent Airway and No signs of Nausea or vomiting  Post-op Vital Signs: Reviewed and stable  Complications: No apparent anesthesia complications

## 2015-10-17 NOTE — Anesthesia Preprocedure Evaluation (Signed)
Anesthesia Evaluation    Airway Mallampati: II  TM Distance: >3 FB Neck ROM: Full    Dental no notable dental hx. (+) Chipped, Dental Advisory Given   Pulmonary neg pulmonary ROS, former smoker,    Pulmonary exam normal breath sounds clear to auscultation       Cardiovascular Exercise Tolerance: Good METS: 5 - 7 Mets Normal cardiovascular exam Rhythm:Regular Rate:Normal     Neuro/Psych negative psych ROS   GI/Hepatic GERD  Medicated and Controlled,  Endo/Other    Renal/GU Renal disease  negative genitourinary   Musculoskeletal  (+) Arthritis ,   Abdominal   Peds  Hematology   Anesthesia Other Findings Very poor dentition  Reproductive/Obstetrics negative OB ROS                             Anesthesia Physical Anesthesia Plan  ASA: III  Anesthesia Plan: General and MAC   Post-op Pain Management:    Induction: Intravenous  Airway Management Planned: Mask and Natural Airway  Additional Equipment:   Intra-op Plan:   Post-operative Plan: Extubation in OR  Informed Consent: I have reviewed the patients History and Physical, chart, labs and discussed the procedure including the risks, benefits and alternatives for the proposed anesthesia with the patient or authorized representative who has indicated his/her understanding and acceptance.   Dental advisory given  Plan Discussed with: CRNA  Anesthesia Plan Comments:         Anesthesia Quick Evaluation

## 2015-10-17 NOTE — H&P (Signed)
[facility]  [address], [address] [place], [place] [address] Phone: [phone] Fax : [phone]  Primary Care Physician:  [name], MD Primary Gastroenterologist:  Dr. Servando Snare  Pre-Procedure History & Physical: HPI:  Anthony [name] is a 65 y.o. male is here for an endoscopy.   Past Medical History  Diagnosis Date  . GERD (gastroesophageal reflux disease)   . H/O mitral valve prolapse 2008 per echo    asymptomatic and does not see cardiologist.   . Hematuria   . Renal calculi     left  . Bilateral ureteral calculi   . History of kidney stones   . Renal insufficiency   . Chronic back pain   . 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 Arthritis     hands  . DDD (degenerative disc disease), lumbosacral     Past Surgical History  Procedure Laterality Date  . Biceps tendon repair Right 2004 approx  . Cystoscopy with retrograde pyelogram, ureteroscopy and stent placement Right 01/24/2014    Procedure: CYSTOSCOPY WITH RETROGRADE PYELOGRAM, URETEROSCOPY AND STENT PLACEMENT, holmium laser, stone extraction;  Surgeon: Danae Chen, MD;  Location: [facility];  Service: Urology;  Laterality: Right;  . Cystoscopy with retrograde pyelogram, ureteroscopy and stent placement Right 02/07/2014    Procedure: RIGHT URETEROSCOPY, STONE EXTRACTION AND POSSIBLE STENT PLACEMENT;  Surgeon: Anner Crete, MD;  Location: [facility];  Service: Urology;  Laterality: Right;  . Holmium laser application Right 02/07/2014    Procedure: HOLMIUM LASER APPLICATION;  Surgeon: Anner Crete, MD;  Location: [facility];  Service: Urology;  Laterality: Right;  . Cystoscopy with retrograde pyelogram, ureteroscopy and stent placement Bilateral 02/04/2015    Procedure: CYSTOSCOPY WITH RETROGRADE PYELOGRAM, URETEROSCOPY AND STENT PLACEMENT, BILATERAL;  Surgeon: Jethro Bolus, MD;  Location: [facility];   Service: Urology;  Laterality: Bilateral;  . Extracorporeal shock wave lithotripsy  Left 02-13-2015//  Right 01-17-2014  . Cysto/  right retrograde pyelogram/ ureteroscopy attempted stone manipulation/  right stent placement  03-03-2009  . Right ureteroscopic stone extraction  03-13-2009  . Left ureteroscopic laser lithotripsy stone extraction w/ stent placement  03-09-2011;   09-18-2009  . Left ureteroscopic stone extraction w/ stent  09-25-2009  . Posterior fusion lumbar spine  09-08-2010    Re-do Laminectomy L3-S1 w/  Decompression and fusion   . Lumbar laminectomy  x2  ----  L3 - L5//  L5 -- S1  . Anterior cervical decomp/discectomy fusion  C5--C7  06-05-2002//  C7--T1   03-29-2006  . Transthoracic echocardiogram  07-04-2007    Mild focal basal septal hypertrophy/  ef 60%/  mild MVP involving anterior and posterior leaflets w/ trivial MR/  trivial pericardial effusion posterior to the heart  . Cystoscopy with ureteroscopy, stone basketry and stent placement Bilateral 02/20/2015    Procedure: CYSTOSCOPY WITH BILATERAL URETEROSCOPY, STONE EXTRACTION WITH LASER  AND STENT PLACEMENT;  Surgeon: Bjorn Pippin, MD;  Location: [facility];  Service: Urology;  Laterality: Bilateral;  . Holmium laser application N/A 02/20/2015    Procedure: HOLMIUM LASER APPLICATION;  Surgeon: Bjorn Pippin, MD;  Location: [facility];  Service: Urology;  Laterality: N/A;  . Laparoscopic cholecystectomy single port N/A 04/19/2015    Procedure: LAPAROSCOPIC CHOLECYSTECTOMY;  Surgeon: Lattie Haw, MD;  Location: [facility];  Service: General;  Laterality: N/A;  . Colonoscopy with propofol N/A 06/27/2015  Procedure: COLONOSCOPY WITH PROPOFOL;  Surgeon: Midge Minium, MD;  Location[facility];  Service: Endoscopy;  Laterality: N/A;    Prior to Admission medications   Medication Sig Start Date End Date Taking? Authorizing Provider  diazepam (VALIUM) 5 MG tablet Take 1 tablet (5 mg total)  by mouth every 8 (eight) hours as needed for anxiety. 05/05/15 05/04/16 Yes Rebecka Apley, MD  HYDROmorphone (DILAUDID) 2 MG tablet Take 1 tablet (2 mg total) by mouth every 4 (four) hours as needed for moderate pain or severe pain. 02/20/15  Yes Bjorn Pippin, MD  omeprazole (PRILOSEC OTC) 20 MG tablet Take 20 mg by mouth every morning.    Yes Historical Provider, MD    Allergies as of 10/09/2015 - Review Complete 10/09/2015  Allergen Reaction Noted  . Cheese Other (See Comments) 06/23/2015  . Codeine Itching 01/09/2014  . Lactose intolerance (gi) Other (See Comments) 06/23/2015    Family History  Problem Relation Age of Onset  . Cancer Mother   . Diabetes Mother   . Hypertension Mother   . Stroke Mother   . Colon cancer Mother   . Hypertension Brother   . Colon cancer Brother     Pre-cancerous Polyps removed  . Kidney disease Brother     Chronic- secondary to Hypertension  . Stroke Father   . Thyroid disease Daughter   . Colon cancer Maternal Aunt     x 2  . Colon cancer Maternal Uncle     Social History   Social History  . Marital Status: Widowed    Spouse Name: N/A  . Number of Children: N/A  . Years of Education: N/A   Occupational History  . Not on file.   Social History Main Topics  . Smoking status: Former Smoker    Types: Pipe    Quit date: 02/17/2000  . Smokeless tobacco: Never Used  . Alcohol Use: 0.6 oz/week    1 Shots of liquor per week     Comment: occasionally   . Drug Use: No  . Sexual Activity: Not on file   Other Topics Concern  . Not on file   Social History Narrative    Review of Systems: See HPI, otherwise negative ROS  Physical Exam: BP 122/87 mmHg  Pulse 78  Temp(Src) 97.7 F (36.5 C) (Temporal)  Resp 16  Ht  (1.803 m)  Wt 179 lb (81.194 kg)  BMI 24.98 kg/m2  SpO2 98% General:   Alert,  pleasant and cooperative in NAD Head:  Normocephalic and atraumatic. Neck:  Supple; no masses or thyromegaly. Lungs:  Clear  throughout to auscultation.    Heart:  Regular rate and rhythm. Abdomen:  Soft, nontender and nondistended. Normal bowel sounds, without guarding, and without rebound.   Neurologic:  Alert and  oriented x4;  grossly normal neurologically.  Impression/Plan: Anthony [name] is here for an endoscopy to be performed for Nausea  Risks, benefits, limitations, and alternatives regarding  endoscopy have been reviewed with the patient.  Questions have been answered.  All parties agreeable.   Darlina Rumpf, MD  10/17/2015, 12:30 PM

## 2015-10-17 NOTE — Op Note (Signed)
[facility] Patient Name: Anthony [name] Procedure Date: 10/17/2015 12:07 PM MRN: [id number] Account #: [id number] Date of Birth: 1951/06/16 Admit Type: Outpatient Age: 65 Room: [facility] OR ROOM 01 Gender: Male Note Status: Finalized Procedure:            Upper GI endoscopy Indications:          Nausea Providers:            Midge Minium, MD Referring MD:         Windle Guard, MD (Referring MD) Medicines:            Propofol per Anesthesia Complications:        No immediate complications. Procedure:            Pre-Anesthesia Assessment:                       - Prior to the procedure, a History and Physical was                        performed, and patient medications and allergies were                        reviewed. The patient's tolerance of previous                        anesthesia was also reviewed. The risks and benefits of                        the procedure and the sedation options and risks were                        discussed with the patient. All questions were                        answered, and informed consent was obtained. Prior                        Anticoagulants: The patient has taken no previous                        anticoagulant or antiplatelet agents. ASA Grade                        Assessment: II - A patient with mild systemic disease.                        After reviewing the risks and benefits, the patient was                        deemed in satisfactory condition to undergo the                        procedure.                       After obtaining informed consent, the endoscope was                        passed under direct vision. Throughout the procedure,  the patient's blood pressure, pulse, and oxygen                        saturations were monitored continuously. The Olympus                        [id number]-HQ190 Endoscope (S#. [id number]) was introduced                        through the mouth,  and advanced to the second part of                        duodenum. The upper GI endoscopy was accomplished                        without difficulty. The patient tolerated the procedure                        well. Findings:      A large hiatal hernia was present.      Localized mild inflammation characterized by erythema was found in the       gastric antrum. Biopsies were taken with a cold forceps for histology.      The examined duodenum was normal. Impression:           - Large hiatal hernia.                       - Gastritis. Biopsied.                       - Normal examined duodenum. Recommendation:       - Await pathology results. Procedure Code(s):    --- Professional ---                       [phone], Esophagogastroduodenoscopy, flexible, transoral;                        with biopsy, single or multiple Diagnosis Code(s):    --- Professional ---                       R11.0, Nausea                       K44.9, Diaphragmatic hernia without obstruction or                        gangrene                       K29.70, Gastritis, unspecified, without bleeding CPT copyright 2016 American Medical Association. All rights reserved. The codes documented in this report are preliminary and upon coder review may  be revised to meet current compliance requirements. Midge Minium, MD 10/17/2015 12:24:53 PM This report has been signed electronically. Number of Addenda: 0 Note Initiated On: 10/17/2015 12:07 PM Total Procedure Duration: 0 hours 4 minutes 46 seconds       [facility]

## 2015-10-20 ENCOUNTER — Encounter: Payer: Self-pay | Admitting: Gastroenterology

## 2015-10-22 ENCOUNTER — Encounter: Payer: Self-pay | Admitting: Gastroenterology

## 2015-11-03 ENCOUNTER — Ambulatory Visit (INDEPENDENT_AMBULATORY_CARE_PROVIDER_SITE_OTHER): Payer: BLUE CROSS/BLUE SHIELD | Admitting: Gastroenterology

## 2015-11-03 ENCOUNTER — Encounter: Payer: Self-pay | Admitting: Gastroenterology

## 2015-11-03 VITALS — BP 140/88 | HR 71 | Temp 98.1°F | Ht 71.0 in | Wt 186.0 lb

## 2015-11-03 DIAGNOSIS — R7989 Other specified abnormal findings of blood chemistry: Secondary | ICD-10-CM | POA: Diagnosis not present

## 2015-11-03 DIAGNOSIS — R945 Abnormal results of liver function studies: Secondary | ICD-10-CM

## 2015-11-03 MED ORDER — PANTOPRAZOLE SODIUM 40 MG PO TBEC: 40.0000 mg | DELAYED_RELEASE_TABLET | Freq: Every day | ORAL | Status: DC

## 2015-11-03 NOTE — Progress Notes (Signed)
   Primary Care Physician: Anthony [name],Anthony OLIVER, Anthony [name]  Primary Gastroenterologist:  Dr. Midge Miniumarren Marzella [name]  Chief Complaint  Patient presents with  . Follow up elevated liver enzymes    HPI: Anthony [name] is a 65 y.o. male here for follow-up of abnormal liver enzymes and abdominal pain. The patient reports that his abdominal pain is better but not completely gone. The patient continues to use NSAIDs for his back pain. He was also found to have gastritis on his upper endoscopy.  The patient has been on omeprazole without much help.  Current Outpatient Prescriptions  Medication Sig Dispense Refill  . diazepam (VALIUM) 5 MG tablet Take 1 tablet (5 mg total) by mouth every 8 (eight) hours as needed for anxiety. 12 tablet 0  . HYDROmorphone (DILAUDID) 2 MG tablet Take 1 tablet (2 mg total) by mouth every 4 (four) hours as needed for moderate pain or severe pain. 30 tablet 0  . omeprazole (PRILOSEC OTC) 20 MG tablet Take 20 mg by mouth every morning.     . pantoprazole (PROTONIX) 40 MG tablet Take 1 tablet (40 mg total) by mouth daily before breakfast. 30 tablet 11   No current facility-administered medications for this visit.    Allergies as of 11/03/2015 - Review Complete 11/03/2015  Allergen Reaction Noted  . Cheese Other (See Comments) 06/23/2015  . Codeine Itching 01/09/2014  . Lactose intolerance (gi) Other (See Comments) 06/23/2015    ROS:  General: Negative for anorexia, weight loss, fever, chills, fatigue, weakness. ENT: Negative for hoarseness, difficulty swallowing , nasal congestion. CV: Negative for chest pain, angina, palpitations, dyspnea on exertion, peripheral edema.  Respiratory: Negative for dyspnea at rest, dyspnea on exertion, cough, sputum, wheezing.  GI: See history of present illness. GU:  Negative for dysuria, hematuria, urinary incontinence, urinary frequency, nocturnal urination.  Endo: Negative for unusual weight change.    Physical Examination:   BP 140/88  mmHg  Pulse 71  Temp(Src) 98.1 F (36.7 C) (Oral)  Ht 5\' 11"  (1.803 m)  Wt 186 lb (84.369 kg)  BMI 25.95 kg/m2  General: Well-nourished, well-developed in no acute distress.  Eyes: No icterus. Conjunctivae pink. Mouth: Oropharyngeal mucosa moist and pink , no lesions erythema or exudate. Lungs: Clear to auscultation bilaterally. Non-labored. Heart: Regular rate and rhythm, no murmurs rubs or gallops.  Abdomen: Bowel sounds are normal, nontender, nondistended, no hepatosplenomegaly or masses, no abdominal bruits or hernia , no rebound or guarding.   Extremities: No lower extremity edema. No clubbing or deformities. Neuro: Alert and oriented x 3.  Grossly intact. Skin: Warm and dry, no jaundice.   Psych: Alert and cooperative, normal mood and affect.  Labs:    Imaging Studies: No results found.  Assessment and Plan:   Anthony [name] is a 65 y.o. y/o male  who has gastritis on his upper endoscopy and has been told to avoid NSAIDs. His liver enzymes were better last time he was seen but not back to normal. The patient also has a high iron level. He will have a repeat liver enzymes checked today and a hemochromatosis gene sent off. He will also be switched to Protonix 40 mg per day. The patient has been explained the plan and agrees with it.   Note: This dictation was prepared with Dragon dictation along with smaller phrase technology. Any transcriptional errors that result from this process are unintentional.

## 2015-11-04 ENCOUNTER — Telehealth: Payer: Self-pay

## 2015-11-04 NOTE — Telephone Encounter (Signed)
-----  Message from Lucilla Lame, MD sent at 11/04/2015  8:00 AM EDT ----- Let the patient know that his labs are much better then before with only the alk phos slightly increased.

## 2015-11-04 NOTE — Telephone Encounter (Signed)
Pt notified of results

## 2015-11-07 LAB — HEPATIC FUNCTION PANEL
ALT: 21 IU/L (ref 0–44)
AST: 19 IU/L (ref 0–40)
Albumin: 4.1 g/dL (ref 3.6–4.8)
Alkaline Phosphatase: 156 IU/L — ABNORMAL HIGH (ref 39–117)
Bilirubin Total: 0.4 mg/dL (ref 0.0–1.2)
Bilirubin, Direct: 0.16 mg/dL (ref 0.00–0.40)
Total Protein: 6.4 g/dL (ref 6.0–8.5)

## 2015-11-07 LAB — HEMOCHROMATOSIS DNA-PCR(C282Y,H63D)

## 2015-11-11 ENCOUNTER — Telehealth: Payer: Self-pay

## 2015-11-11 NOTE — Telephone Encounter (Deleted)
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 One in 9 white man are carriers. This is unlikely causing him any problems. 

## 2015-11-11 NOTE — Telephone Encounter (Signed)
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 One in 9 white man are carriers. This is unlikely causing him any problems.

## 2015-11-11 NOTE — Telephone Encounter (Signed)
Pt notified of results. Advised him to notify his children of this so they may get checked as well.

## 2016-09-15 ENCOUNTER — Other Ambulatory Visit: Payer: Self-pay | Admitting: Gastroenterology

## 2016-09-17 ENCOUNTER — Other Ambulatory Visit: Payer: Self-pay

## 2016-09-17 MED ORDER — PANTOPRAZOLE SODIUM 40 MG PO TBEC: 40.0000 mg | DELAYED_RELEASE_TABLET | Freq: Every day | ORAL | 11 refills | Status: DC

## 2017-03-23 DIAGNOSIS — M25511 Pain in right shoulder: Secondary | ICD-10-CM | POA: Diagnosis not present

## 2017-04-01 DIAGNOSIS — M25511 Pain in right shoulder: Secondary | ICD-10-CM | POA: Diagnosis not present

## 2017-08-31 ENCOUNTER — Other Ambulatory Visit: Payer: Self-pay | Admitting: Gastroenterology

## 2017-11-06 ENCOUNTER — Inpatient Hospital Stay
Admission: EM | Admit: 2017-11-06 | Discharge: 2017-11-07 | DRG: 728 | Disposition: A | Discharge status: Home / Self Care | Payer: BLUE CROSS/BLUE SHIELD | Attending: Internal Medicine | Admitting: Internal Medicine

## 2017-11-06 ENCOUNTER — Encounter: Payer: Self-pay | Admitting: Emergency Medicine

## 2017-11-06 DIAGNOSIS — Z981 Arthrodesis status: Secondary | ICD-10-CM

## 2017-11-06 DIAGNOSIS — N410 Acute prostatitis: Secondary | ICD-10-CM | POA: Diagnosis not present

## 2017-11-06 DIAGNOSIS — Z87442 Personal history of urinary calculi: Secondary | ICD-10-CM

## 2017-11-06 DIAGNOSIS — R339 Retention of urine, unspecified: Secondary | ICD-10-CM | POA: Diagnosis not present

## 2017-11-06 DIAGNOSIS — Z87891 Personal history of nicotine dependence: Secondary | ICD-10-CM | POA: Diagnosis not present

## 2017-11-06 DIAGNOSIS — R338 Other retention of urine: Secondary | ICD-10-CM

## 2017-11-06 DIAGNOSIS — I341 Nonrheumatic mitral (valve) prolapse: Secondary | ICD-10-CM | POA: Diagnosis present

## 2017-11-06 DIAGNOSIS — N183 Chronic kidney disease, stage 3 (moderate): Secondary | ICD-10-CM | POA: Diagnosis present

## 2017-11-06 DIAGNOSIS — Z79899 Other long term (current) drug therapy: Secondary | ICD-10-CM

## 2017-11-06 DIAGNOSIS — B961 Klebsiella pneumoniae [K. pneumoniae] as the cause of diseases classified elsewhere: Secondary | ICD-10-CM | POA: Diagnosis present

## 2017-11-06 DIAGNOSIS — K219 Gastro-esophageal reflux disease without esophagitis: Secondary | ICD-10-CM | POA: Diagnosis not present

## 2017-11-06 DIAGNOSIS — N390 Urinary tract infection, site not specified: Secondary | ICD-10-CM | POA: Diagnosis present

## 2017-11-06 LAB — URINALYSIS, COMPLETE (UACMP) WITH MICROSCOPIC
Bilirubin Urine: NEGATIVE
Glucose, UA: NEGATIVE mg/dL
Ketones, ur: 5 mg/dL — AB
Nitrite: NEGATIVE
Protein, ur: NEGATIVE mg/dL
Specific Gravity, Urine: 1.015 (ref 1.005–1.030)
Squamous Epithelial / LPF: NONE SEEN
pH: 5 (ref 5.0–8.0)

## 2017-11-06 LAB — BASIC METABOLIC PANEL
Anion gap: 11 (ref 5–15)
BUN: 26 mg/dL — ABNORMAL HIGH (ref 6–20)
CO2: 20 mmol/L — ABNORMAL LOW (ref 22–32)
Calcium: 9.4 mg/dL (ref 8.9–10.3)
Chloride: 101 mmol/L (ref 101–111)
Creatinine, Ser: 1.53 mg/dL — ABNORMAL HIGH (ref 0.61–1.24)
GFR calc Af Amer: 53 mL/min — ABNORMAL LOW (ref >60)
GFR calc non Af Amer: 46 mL/min — ABNORMAL LOW (ref >60)
Glucose, Bld: 119 mg/dL — ABNORMAL HIGH (ref 65–99)
Potassium: 4 mmol/L (ref 3.5–5.1)
Sodium: 132 mmol/L — ABNORMAL LOW (ref 135–145)

## 2017-11-06 LAB — CBC
HCT: 50.7 % (ref 40.0–52.0)
Hemoglobin: 16.7 g/dL (ref 13.0–18.0)
MCH: 28.6 pg (ref 26.0–34.0)
MCHC: 33 g/dL (ref 32.0–36.0)
MCV: 86.5 fL (ref 80.0–100.0)
Platelets: 170 10*3/uL (ref 150–440)
RBC: 5.86 MIL/uL (ref 4.40–5.90)
RDW: 14.3 % (ref 11.5–14.5)
WBC: 23.3 10*3/uL — ABNORMAL HIGH (ref 3.8–10.6)

## 2017-11-06 MED ORDER — LIDOCAINE HCL 2 % EX GEL
1.0000 "application " | Freq: Once | CUTANEOUS | Status: AC
  Administered 2017-11-06: 1 via URETHRAL

## 2017-11-06 MED ORDER — LEVOFLOXACIN 500 MG PO TABS
500.0000 mg | ORAL_TABLET | Freq: Once | ORAL | Status: AC
  Administered 2017-11-07: 500 mg via ORAL
  Filled 2017-11-06: qty 1

## 2017-11-06 MED ORDER — TAMSULOSIN HCL 0.4 MG PO CAPS: 0.4000 mg | ORAL_CAPSULE | Freq: Every day | ORAL | 0 refills | Status: DC

## 2017-11-06 MED ORDER — LEVOFLOXACIN 500 MG PO TABS: 500.0000 mg | ORAL_TABLET | Freq: Every day | ORAL | 0 refills | Status: DC

## 2017-11-06 MED ORDER — SODIUM CHLORIDE 0.9 % IV SOLN
1.0000 g | Freq: Once | INTRAVENOUS | Status: AC
  Administered 2017-11-06: 1 g via INTRAVENOUS
  Filled 2017-11-06: qty 10

## 2017-11-06 MED ORDER — LIDOCAINE HCL 2 % EX GEL
CUTANEOUS | Status: AC
  Filled 2017-11-06: qty 10

## 2017-11-06 NOTE — ED Triage Notes (Signed)
Pt comes into the ED via POV c/o lower abdominal pain associates with his bladder.  Patient states that the last full void he had was yesterday.  Denies any BPH or other prostate problems. Patient in NAD at this time with even and unlabored respirations.

## 2017-11-06 NOTE — ED Provider Notes (Addendum)
[facility] Emergency Department Provider Note    None    (approximate)  I have reviewed the triage vital signs and the nursing notes.   HISTORY  Chief Complaint Urinary Retention    HPI Anthony [name] is a 67 y.o. male presents with 24 hours of suprapubic pain and inability to empty his bladder.  States that with pressure he has been able to squeeze out some urine when he bends over.  States he is also had some pain with defecation.  Denies any history of enlarged prostate.  Has had some chills at home but no measured fevers.  Denies any back pain.  Is never had symptoms like this before.  Has not noticed any blood in his urine.  The pain upon arrival was severe but after Foley catheter placement has resolved.  Past Medical History:  Diagnosis Date  . Arthritis    hands  . Bilateral ureteral calculi   . Chronic back pain   . DDD (degenerative disc disease), lumbosacral   . GERD (gastroesophageal reflux disease)   . H/O mitral valve prolapse 2008 per echo   asymptomatic and does not see cardiologist.   . Hematuria   . History of kidney stones   . 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 Renal calculi    left  . Renal insufficiency    Family History  Problem Relation Age of Onset  . Cancer Mother   . Diabetes Mother   . Hypertension Mother   . Stroke Mother   . Colon cancer Mother   . Hypertension Brother   . Colon cancer Brother        Pre-cancerous Polyps removed  . Kidney disease Brother        Chronic- secondary to Hypertension  . Stroke Father   . Thyroid disease Daughter   . Colon cancer Maternal Aunt        x 2  . Colon cancer Maternal Uncle    Past Surgical History:  Procedure Laterality Date  . ANTERIOR CERVICAL DECOMP/DISCECTOMY FUSION  C5--C7  06-05-2002//  C7--T1   03-29-2006  . BICEPS TENDON REPAIR Right 2004 approx  . COLONOSCOPY WITH PROPOFOL N/A 06/27/2015   Procedure:  COLONOSCOPY WITH PROPOFOL;  Surgeon: Midge Minium, MD;  Location: [facility];  Service: Endoscopy;  Laterality: N/A;  . CYSTO/  RIGHT RETROGRADE PYELOGRAM/ URETEROSCOPY ATTEMPTED STONE MANIPULATION/  RIGHT STENT PLACEMENT  03-03-2009  . CYSTOSCOPY WITH RETROGRADE PYELOGRAM, URETEROSCOPY AND STENT PLACEMENT Right 01/24/2014   Procedure: CYSTOSCOPY WITH RETROGRADE PYELOGRAM, URETEROSCOPY AND STENT PLACEMENT, holmium laser, stone extraction;  Surgeon: Danae Chen, MD;  Location: [facility];  Service: Urology;  Laterality: Right;  . CYSTOSCOPY WITH RETROGRADE PYELOGRAM, URETEROSCOPY AND STENT PLACEMENT Right 02/07/2014   Procedure: RIGHT URETEROSCOPY, STONE EXTRACTION AND POSSIBLE STENT PLACEMENT;  Surgeon: Anner Crete, MD;  Location: [facility];  Service: Urology;  Laterality: Right;  . CYSTOSCOPY WITH RETROGRADE PYELOGRAM, URETEROSCOPY AND STENT PLACEMENT Bilateral 02/04/2015   Procedure: CYSTOSCOPY WITH RETROGRADE PYELOGRAM, URETEROSCOPY AND STENT PLACEMENT, BILATERAL;  Surgeon: Jethro Bolus, MD;  Location: [facility];  Service: Urology;  Laterality: Bilateral;  . CYSTOSCOPY WITH URETEROSCOPY, STONE BASKETRY AND STENT PLACEMENT Bilateral 02/20/2015   Procedure: CYSTOSCOPY WITH BILATERAL URETEROSCOPY, STONE EXTRACTION WITH LASER  AND STENT PLACEMENT;  Surgeon: Bjorn Pippin, MD;  Location: [facility];  Service: Urology;  Laterality: Bilateral;  .  ESOPHAGOGASTRODUODENOSCOPY (EGD) WITH PROPOFOL N/A 10/17/2015   Procedure: ESOPHAGOGASTRODUODENOSCOPY (EGD) WITH PROPOFOL;  Surgeon: Midge Minium, MD;  Location: [facility];  Service: Endoscopy;  Laterality: N/A;  . EXTRACORPOREAL SHOCK WAVE LITHOTRIPSY  Left 02-13-2015//  Right 01-17-2014  . HOLMIUM LASER APPLICATION Right 02/07/2014   Procedure: HOLMIUM LASER APPLICATION;  Surgeon: Anner Crete, MD;  Location: [facility];  Service: Urology;  Laterality: Right;  . HOLMIUM LASER  APPLICATION N/A 02/20/2015   Procedure: HOLMIUM LASER APPLICATION;  Surgeon: Bjorn Pippin, MD;  Location: [facility];  Service: Urology;  Laterality: N/A;  . LAPAROSCOPIC CHOLECYSTECTOMY SINGLE PORT N/A 04/19/2015   Procedure: LAPAROSCOPIC CHOLECYSTECTOMY;  Surgeon: Lattie Haw, MD;  Location: [facility];  Service: General;  Laterality: N/A;  . LEFT URETEROSCOPIC LASER LITHOTRIPSY STONE EXTRACTION W/ STENT PLACEMENT  03-09-2011;   09-18-2009  . LEFT URETEROSCOPIC STONE EXTRACTION W/ STENT  09-25-2009  . LUMBAR LAMINECTOMY  x2  ----  L3 - L5//  L5 -- S1  . POSTERIOR FUSION LUMBAR SPINE  09-08-2010   Re-do Laminectomy L3-S1 w/  Decompression and fusion   . RIGHT URETEROSCOPIC STONE EXTRACTION  03-13-2009  . TRANSTHORACIC ECHOCARDIOGRAM  07-04-2007   Mild focal basal septal hypertrophy/  ef 60%/  mild MVP involving anterior and posterior leaflets w/ trivial MR/  trivial pericardial effusion posterior to the heart   Patient Active Problem List   Diagnosis Date Noted  . Nausea   . Hiatal hernia   . Gastritis   . Elevated transaminase level 09/18/2015  . Hyperbilirubinemia 09/18/2015  . GERD (gastroesophageal reflux disease) 09/18/2015  . BPH (benign prostatic hyperplasia) 09/18/2015  . Special screening for malignant neoplasms, colon   . Second degree hemorrhoids   . Acute cholecystitis 04/19/2015  . Cholecystitis   . Epigastric abdominal pain   . Right ureteral calculus 02/05/2015  . Left ureteral calculus 02/05/2015  . CKD (chronic kidney disease), stage III (HCC) 02/05/2015  . Ureteral stone 02/04/2015      Prior to Admission medications   Medication Sig Start Date End Date Taking? Authorizing Provider  pantoprazole (PROTONIX) 40 MG tablet TAKE 1 TABLET (40 MG TOTAL) BY MOUTH DAILY BEFORE BREAKFAST. 09/01/17  Yes Midge Minium, MD  levofloxacin (LEVAQUIN) 500 MG tablet Take 1 tablet (500 mg total) by mouth daily for 21 days. 11/06/17 11/27/17  Willy Eddy, MD    tamsulosin (FLOMAX) 0.4 MG CAPS capsule Take 1 capsule (0.4 mg total) by mouth daily after supper. 11/06/17   Willy Eddy, MD    Allergies Cheese; Codeine; and Lactose intolerance (gi)    Social History Social History   Tobacco Use  . Smoking status: Former Smoker    Types: Pipe    Last attempt to quit: 02/17/2000    Years since quitting: 17.7  . Smokeless tobacco: Never Used  Substance Use Topics  . Alcohol use: Yes    Alcohol/week: 0.6 oz    Types: 1 Shots of liquor per week    Comment: occasionally   . Drug use: No    Review of Systems Patient denies headaches, rhinorrhea, blurry vision, numbness, shortness of breath, chest pain, edema, cough, abdominal pain, nausea, vomiting, diarrhea, dysuria, fevers, rashes or hallucinations unless otherwise stated above in HPI. ____________________________________________   PHYSICAL EXAM:  VITAL SIGNS: Vitals:   11/06/17 2135  BP: 115/78  Pulse: (!) 123  Resp: 18  Temp: 98.5 F (36.9 C)  SpO2: 98%    Constitutional: Alert and oriented. Well appearing  and in no acute distress. Eyes: Conjunctivae are normal.  Head: Atraumatic. Nose: No congestion/rhinnorhea. Mouth/Throat: Mucous membranes are moist.   Neck: No stridor. Painless ROM.  Cardiovascular: Normal rate, regular rhythm. Grossly normal heart sounds.  Good peripheral circulation. Respiratory: Normal respiratory effort.  No retractions. Lungs CTAB. Gastrointestinal: Soft and nontender. No distention. No abdominal bruits. No CVA tenderness. Genitourinary: Does have boggy tender prostate without palpable abscess. Musculoskeletal: No lower extremity tenderness nor edema.  No joint effusions. Neurologic:  Normal speech and language. No gross focal neurologic deficits are appreciated. No facial droop Skin:  Skin is warm, dry and intact. No rash noted. Psychiatric: Mood and affect are normal. Speech and behavior are  normal.  ____________________________________________   LABS (all labs ordered are listed, but only abnormal results are displayed)  Results for orders placed or performed during the hospital encounter of 11/06/17 (from the past 24 hour(s))  Urinalysis, Complete w Microscopic     Status: Abnormal   Collection Time: 11/06/17  9:38 PM  Result Value Ref Range   Color, Urine YELLOW (A) YELLOW   APPearance HAZY (A) CLEAR   Specific Gravity, Urine 1.015 1.005 - 1.030   pH 5.0 5.0 - 8.0   Glucose, UA NEGATIVE NEGATIVE mg/dL   Hgb urine dipstick MODERATE (A) NEGATIVE   Bilirubin Urine NEGATIVE NEGATIVE   Ketones, ur 5 (A) NEGATIVE mg/dL   Protein, ur NEGATIVE NEGATIVE mg/dL   Nitrite NEGATIVE NEGATIVE   Leukocytes, UA SMALL (A) NEGATIVE   RBC / HPF 0-5 0 - 5 RBC/hpf   WBC, UA TOO NUMEROUS TO COUNT 0 - 5 WBC/hpf   Bacteria, UA MANY (A) NONE SEEN   Squamous Epithelial / LPF NONE SEEN NONE SEEN  Basic metabolic panel     Status: Abnormal   Collection Time: 11/06/17  9:38 PM  Result Value Ref Range   Sodium 132 (L) 135 - 145 mmol/L   Potassium 4.0 3.5 - 5.1 mmol/L   Chloride 101 101 - 111 mmol/L   CO2 20 (L) 22 - 32 mmol/L   Glucose, Bld 119 (H) 65 - 99 mg/dL   BUN 26 (H) 6 - 20 mg/dL   Creatinine, Ser 1.611.53 (H) 0.61 - 1.24 mg/dL   Calcium 9.4 8.9 - 09.610.3 mg/dL   GFR calc non Af Amer 46 (L) >60 mL/min   GFR calc Af Amer 53 (L) >60 mL/min   Anion gap 11 5 - 15  CBC     Status: Abnormal   Collection Time: 11/06/17  9:38 PM  Result Value Ref Range   WBC 23.3 (H) 3.8 - 10.6 K/uL   RBC 5.86 4.40 - 5.90 MIL/uL   Hemoglobin 16.7 13.0 - 18.0 g/dL   HCT 04.550.7 40.940.0 - 81.152.0 %   MCV 86.5 80.0 - 100.0 fL   MCH 28.6 26.0 - 34.0 pg   MCHC 33.0 32.0 - 36.0 g/dL   RDW 91.414.3 78.211.5 - 95.614.5 %   Platelets 170 150 - 440 K/uL    ____________________________________________  ____________________________________________  RADIOLOGY   ____________________________________________   PROCEDURES  Procedure(s) performed:  Procedures    Critical Care performed: no ____________________________________________   INITIAL IMPRESSION / ASSESSMENT AND PLAN / ED COURSE  Pertinent labs & imaging results that were available during my care of the patient were reviewed by me and considered in my medical decision making (see chart for details).  DDX: Prostatitis, acute cystitis, BPH, urinary retention, urethritis, Pilo, stone  Shela LeffWilliam R Suthers is a 67 y.o.  who presents to the ED with symptoms as described above.  Did have significant resolution of symptoms after Foley catheter placement and decompression of his bladder.  Based on his bladder volume greater than 800 and concern for distention injury we will keep Foley in place.  Physical exam is consistent with acute prostatitis.  Patient was given dose of IV Rocephin and tolerating oral Levaquin.  Urine will be sent for culture.  His abdominal exam is soft and benign.  Heart rate improved still elevated.  Do not feel CT imaging clinically indicated but does have slowly increasing temperature.  Due to concern for severe UTI concern for sepsis do feel patient would benefit from admission to the hospital for continued IV hydration and hemodynamic monitoring with IV fluids and IV antibiotics.      As part of my medical decision making, I reviewed the following data within the electronic MEDICAL RECORD NUMBER Nursing notes reviewed and incorporated, Labs reviewed, notes from prior ED visits.   ____________________________________________   FINAL CLINICAL IMPRESSION(S) / ED DIAGNOSES  Final diagnoses:  Acute urinary retention  Acute prostatitis      NEW MEDICATIONS STARTED DURING THIS VISIT:  New Prescriptions   LEVOFLOXACIN (LEVAQUIN) 500 MG TABLET    Take 1 tablet  (500 mg total) by mouth daily for 21 days.   TAMSULOSIN (FLOMAX) 0.4 MG CAPS CAPSULE    Take 1 capsule (0.4 mg total) by mouth daily after supper.     Note:  This document was prepared using Dragon voice recognition software and may include unintentional dictation errors.    Willy Eddy, MD 11/06/17 2359    Willy Eddy, MD 11/07/17 [phone]

## 2017-11-06 NOTE — ED Notes (Signed)
Pt states yesterday he had chills and back pain. Pt states today was only able to void a very small amount in the am and then non since. Pt complains of pelvic pain on arrival. Pt was moaning and in obvious distress when arrived to room. Pt reports resolution of pelvic pain after insertion of urinary catheter. Pt with produced after urinary catheter placed. This rn did meet slight resistance from prostate during placement of catheter.

## 2017-11-07 ENCOUNTER — Other Ambulatory Visit: Payer: Self-pay

## 2017-11-07 DIAGNOSIS — N410 Acute prostatitis: Secondary | ICD-10-CM | POA: Diagnosis not present

## 2017-11-07 DIAGNOSIS — Z79899 Other long term (current) drug therapy: Secondary | ICD-10-CM | POA: Diagnosis not present

## 2017-11-07 DIAGNOSIS — N183 Chronic kidney disease, stage 3 (moderate): Secondary | ICD-10-CM | POA: Diagnosis not present

## 2017-11-07 DIAGNOSIS — I341 Nonrheumatic mitral (valve) prolapse: Secondary | ICD-10-CM | POA: Diagnosis present

## 2017-11-07 DIAGNOSIS — R339 Retention of urine, unspecified: Secondary | ICD-10-CM | POA: Diagnosis not present

## 2017-11-07 DIAGNOSIS — K219 Gastro-esophageal reflux disease without esophagitis: Secondary | ICD-10-CM | POA: Diagnosis present

## 2017-11-07 DIAGNOSIS — B961 Klebsiella pneumoniae [K. pneumoniae] as the cause of diseases classified elsewhere: Secondary | ICD-10-CM | POA: Diagnosis present

## 2017-11-07 DIAGNOSIS — Z981 Arthrodesis status: Secondary | ICD-10-CM | POA: Diagnosis not present

## 2017-11-07 DIAGNOSIS — N390 Urinary tract infection, site not specified: Secondary | ICD-10-CM | POA: Diagnosis not present

## 2017-11-07 DIAGNOSIS — Z87442 Personal history of urinary calculi: Secondary | ICD-10-CM | POA: Diagnosis not present

## 2017-11-07 DIAGNOSIS — Z87891 Personal history of nicotine dependence: Secondary | ICD-10-CM | POA: Diagnosis not present

## 2017-11-07 LAB — BASIC METABOLIC PANEL
Anion gap: 9 (ref 5–15)
BUN: 25 mg/dL — ABNORMAL HIGH (ref 6–20)
CO2: 21 mmol/L — ABNORMAL LOW (ref 22–32)
Calcium: 8.6 mg/dL — ABNORMAL LOW (ref 8.9–10.3)
Chloride: 105 mmol/L (ref 101–111)
Creatinine, Ser: 1.44 mg/dL — ABNORMAL HIGH (ref 0.61–1.24)
GFR calc Af Amer: 57 mL/min — ABNORMAL LOW (ref >60)
GFR calc non Af Amer: 50 mL/min — ABNORMAL LOW (ref >60)
Glucose, Bld: 101 mg/dL — ABNORMAL HIGH (ref 65–99)
Potassium: 3.9 mmol/L (ref 3.5–5.1)
Sodium: 135 mmol/L (ref 135–145)

## 2017-11-07 LAB — CBC
HCT: 44.1 % (ref 40.0–52.0)
Hemoglobin: 14.7 g/dL (ref 13.0–18.0)
MCH: 28.9 pg (ref 26.0–34.0)
MCHC: 33.4 g/dL (ref 32.0–36.0)
MCV: 86.7 fL (ref 80.0–100.0)
Platelets: 141 10*3/uL — ABNORMAL LOW (ref 150–440)
RBC: 5.09 MIL/uL (ref 4.40–5.90)
RDW: 14 % (ref 11.5–14.5)
WBC: 17.8 10*3/uL — ABNORMAL HIGH (ref 3.8–10.6)

## 2017-11-07 LAB — GLUCOSE, CAPILLARY: Glucose-Capillary: 86 mg/dL (ref 65–99)

## 2017-11-07 LAB — LACTIC ACID, PLASMA: Lactic Acid, Venous: 1 mmol/L (ref 0.5–1.9)

## 2017-11-07 MED ORDER — TAMSULOSIN HCL 0.4 MG PO CAPS: 0.4000 mg | ORAL_CAPSULE | Freq: Every day | ORAL | 0 refills | Status: DC

## 2017-11-07 MED ORDER — TRAZODONE HCL 50 MG PO TABS
25.0000 mg | ORAL_TABLET | Freq: Every evening | ORAL | Status: DC | PRN
Start: 2017-11-07 — End: 2017-11-07

## 2017-11-07 MED ORDER — BISACODYL 10 MG RE SUPP
10.0000 mg | Freq: Once | RECTAL | Status: AC
  Administered 2017-11-07: 10 mg via RECTAL
  Filled 2017-11-07: qty 1

## 2017-11-07 MED ORDER — TAMSULOSIN HCL 0.4 MG PO CAPS
0.4000 mg | ORAL_CAPSULE | Freq: Every day | ORAL | Status: DC
  Administered 2017-11-07: 0.4 mg via ORAL
  Filled 2017-11-07: qty 1

## 2017-11-07 MED ORDER — ONDANSETRON HCL 4 MG/2ML IJ SOLN: 4.0000 mg | Freq: Four times a day (QID) | INTRAMUSCULAR | Status: DC | PRN

## 2017-11-07 MED ORDER — BISACODYL 5 MG PO TBEC
5.0000 mg | DELAYED_RELEASE_TABLET | Freq: Every day | ORAL | Status: DC | PRN
Start: 2017-11-07 — End: 2017-11-07
  Administered 2017-11-07: 5 mg via ORAL
  Filled 2017-11-07: qty 1

## 2017-11-07 MED ORDER — ACETAMINOPHEN 650 MG RE SUPP: 650.0000 mg | Freq: Four times a day (QID) | RECTAL | Status: DC | PRN

## 2017-11-07 MED ORDER — ACETAMINOPHEN 325 MG PO TABS: 650.0000 mg | ORAL_TABLET | Freq: Four times a day (QID) | ORAL | Status: DC | PRN

## 2017-11-07 MED ORDER — SODIUM CHLORIDE 0.9 % IV BOLUS (SEPSIS)
1000.0000 mL | Freq: Once | INTRAVENOUS | Status: AC
  Administered 2017-11-07: 1000 mL via INTRAVENOUS

## 2017-11-07 MED ORDER — HYDROCODONE-ACETAMINOPHEN 5-325 MG PO TABS: 1.0000 | ORAL_TABLET | ORAL | Status: DC | PRN

## 2017-11-07 MED ORDER — ONDANSETRON HCL 4 MG PO TABS: 4.0000 mg | ORAL_TABLET | Freq: Four times a day (QID) | ORAL | Status: DC | PRN

## 2017-11-07 MED ORDER — SODIUM CHLORIDE 0.9 % IV SOLN
Freq: Once | INTRAVENOUS | Status: AC
  Administered 2017-11-07: 03:00:00 via INTRAVENOUS

## 2017-11-07 MED ORDER — LEVOFLOXACIN 500 MG PO TABS: 500.0000 mg | ORAL_TABLET | Freq: Every day | ORAL | 0 refills | Status: DC

## 2017-11-07 MED ORDER — HEPARIN SODIUM (PORCINE) 5000 UNIT/ML IJ SOLN
5000.0000 [IU] | Freq: Three times a day (TID) | INTRAMUSCULAR | Status: DC
  Administered 2017-11-07: 5000 [IU] via SUBCUTANEOUS
  Filled 2017-11-07: qty 1

## 2017-11-07 MED ORDER — DOCUSATE SODIUM 100 MG PO CAPS
100.0000 mg | ORAL_CAPSULE | Freq: Two times a day (BID) | ORAL | Status: DC
  Administered 2017-11-07: 100 mg via ORAL
  Filled 2017-11-07: qty 1

## 2017-11-07 MED ORDER — LEVOFLOXACIN 500 MG PO TABS
500.0000 mg | ORAL_TABLET | Freq: Every day | ORAL | Status: DC
  Filled 2017-11-07: qty 1

## 2017-11-07 MED ORDER — PANTOPRAZOLE SODIUM 40 MG PO TBEC
40.0000 mg | DELAYED_RELEASE_TABLET | Freq: Every day | ORAL | Status: DC
  Administered 2017-11-07: 40 mg via ORAL
  Filled 2017-11-07: qty 1

## 2017-11-07 NOTE — Progress Notes (Signed)
Discharge instructions reviewed with the patient.  Instructions and education given on foley care.  Patient being sent out via wheelchair with belongings to friends waiting car

## 2017-11-07 NOTE — H&P (Signed)
[facility] -  at [facility]   PATIENT NAME: Anthony [name]    MR#:  [id number]  DATE OF BIRTH:  [birth year]  DATE OF ADMISSION:  11/06/2017  PRIMARY CARE PHYSICIAN: [name], MD   REQUESTING/REFERRING PHYSICIAN:   CHIEF COMPLAINT:   Chief Complaint  Patient presents with  . Urinary Retention    HISTORY OF PRESENT ILLNESS: Anthony [name]  is a 67 y.o. male with a known history of osteoarthritis , kidney stones and CKD 3. Patient presented to emergency room for acute onset of dysuria and suprapubic pressure started in the past 2 days, gradually getting worse.  For the past 24 hours, patient has had difficulty emptying the urinary bladder.  He denies any fever or chills, no bleeding.  He cannot identify any factors day with worsen or improve his symptoms.  No similar episodes in the past. Blood test done in the emergency room are notable for elevated WBC at 23.3.  Creatinine level is 1.53.  UA is positive for UTI.  Bladder scan done in the emergency room revealed 900 mL urine in the bladder.  Foley catheter was placed with good improvement in the suprapubic pressure. Patient is admitted for further evaluation and treatment.  PAST MEDICAL HISTORY:   Past Medical History:  Diagnosis Date  . Arthritis    hands  . Bilateral ureteral calculi   . Chronic back pain   . DDD (degenerative disc disease), lumbosacral   . GERD (gastroesophageal reflux disease)   . H/O mitral valve prolapse 2008 per echo   asymptomatic and does not see cardiologist.   . Hematuria   . History of kidney stones   . 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 Renal calculi    left  . Renal insufficiency     PAST SURGICAL HISTORY:  Past Surgical History:  Procedure Laterality Date  . ANTERIOR CERVICAL DECOMP/DISCECTOMY FUSION  C5--C7  06-05-2002//  C7--T1   03-29-2006  . BICEPS TENDON REPAIR Right 2004 approx  . COLONOSCOPY  WITH PROPOFOL N/A 06/27/2015   Procedure: COLONOSCOPY WITH PROPOFOL;  Surgeon: Midge Minium, MD;  Location: [facility];  Service: Endoscopy;  Laterality: N/A;  . CYSTO/  RIGHT RETROGRADE PYELOGRAM/ URETEROSCOPY ATTEMPTED STONE MANIPULATION/  RIGHT STENT PLACEMENT  03-03-2009  . CYSTOSCOPY WITH RETROGRADE PYELOGRAM, URETEROSCOPY AND STENT PLACEMENT Right 01/24/2014   Procedure: CYSTOSCOPY WITH RETROGRADE PYELOGRAM, URETEROSCOPY AND STENT PLACEMENT, holmium laser, stone extraction;  Surgeon: Danae Chen, MD;  Location: [facility];  Service: Urology;  Laterality: Right;  . CYSTOSCOPY WITH RETROGRADE PYELOGRAM, URETEROSCOPY AND STENT PLACEMENT Right 02/07/2014   Procedure: RIGHT URETEROSCOPY, STONE EXTRACTION AND POSSIBLE STENT PLACEMENT;  Surgeon: Anner Crete, MD;  Location: [facility];  Service: Urology;  Laterality: Right;  . CYSTOSCOPY WITH RETROGRADE PYELOGRAM, URETEROSCOPY AND STENT PLACEMENT Bilateral 02/04/2015   Procedure: CYSTOSCOPY WITH RETROGRADE PYELOGRAM, URETEROSCOPY AND STENT PLACEMENT, BILATERAL;  Surgeon: Jethro Bolus, MD;  Location: [facility];  Service: Urology;  Laterality: Bilateral;  . CYSTOSCOPY WITH URETEROSCOPY, STONE BASKETRY AND STENT PLACEMENT Bilateral 02/20/2015   Procedure: CYSTOSCOPY WITH BILATERAL URETEROSCOPY, STONE EXTRACTION WITH LASER  AND STENT PLACEMENT;  Surgeon: Bjorn Pippin, MD;  Location: [facility];  Service: Urology;  Laterality: Bilateral;  . ESOPHAGOGASTRODUODENOSCOPY (EGD) WITH PROPOFOL N/A 10/17/2015   Procedure: ESOPHAGOGASTRODUODENOSCOPY (EGD) WITH PROPOFOL;  Surgeon: Midge Minium, MD;  Location: [facility];  Service:  Endoscopy;  Laterality: N/A;  . EXTRACORPOREAL SHOCK WAVE LITHOTRIPSY  Left 02-13-2015//  Right 01-17-2014  . HOLMIUM LASER APPLICATION Right 02/07/2014   Procedure: HOLMIUM LASER APPLICATION;  Surgeon: Anner Crete, MD;  Location: [facility];  Service: Urology;   Laterality: Right;  . HOLMIUM LASER APPLICATION N/A 02/20/2015   Procedure: HOLMIUM LASER APPLICATION;  Surgeon: Bjorn Pippin, MD;  Location: [facility];  Service: Urology;  Laterality: N/A;  . LAPAROSCOPIC CHOLECYSTECTOMY SINGLE PORT N/A 04/19/2015   Procedure: LAPAROSCOPIC CHOLECYSTECTOMY;  Surgeon: Lattie Haw, MD;  Location: [facility];  Service: General;  Laterality: N/A;  . LEFT URETEROSCOPIC LASER LITHOTRIPSY STONE EXTRACTION W/ STENT PLACEMENT  03-09-2011;   09-18-2009  . LEFT URETEROSCOPIC STONE EXTRACTION W/ STENT  09-25-2009  . LUMBAR LAMINECTOMY  x2  ----  L3 - L5//  L5 -- S1  . POSTERIOR FUSION LUMBAR SPINE  09-08-2010   Re-do Laminectomy L3-S1 w/  Decompression and fusion   . RIGHT URETEROSCOPIC STONE EXTRACTION  03-13-2009  . TRANSTHORACIC ECHOCARDIOGRAM  07-04-2007   Mild focal basal septal hypertrophy/  ef 60%/  mild MVP involving anterior and posterior leaflets w/ trivial MR/  trivial pericardial effusion posterior to the heart    SOCIAL HISTORY:  Social History   Tobacco Use  . Smoking status: Former Smoker    Types: Pipe    Last attempt to quit: 02/17/2000    Years since quitting: 17.7  . Smokeless tobacco: Never Used  Substance Use Topics  . Alcohol use: Yes    Alcohol/week: 0.6 oz    Types: 1 Shots of liquor per week    Comment: occasionally     FAMILY HISTORY:  Family History  Problem Relation Age of Onset  . Cancer Mother   . Diabetes Mother   . Hypertension Mother   . Stroke Mother   . Colon cancer Mother   . Hypertension Brother   . Colon cancer Brother        Pre-cancerous Polyps removed  . Kidney disease Brother        Chronic- secondary to Hypertension  . Stroke Father   . Thyroid disease Daughter   . Colon cancer Maternal Aunt        x 2  . Colon cancer Maternal Uncle     DRUG ALLERGIES:  Allergies  Allergen Reactions  . Cheese Other (See Comments)    cramps  . Codeine Itching  . Lactose Intolerance (Gi) Other (See  Comments)    GI distress    REVIEW OF SYSTEMS:   CONSTITUTIONAL: No fever, fatigue or weakness.  EYES: No blurred or double vision.  EARS, NOSE, AND THROAT: No tinnitus or ear pain.  RESPIRATORY: No cough, shortness of breath, wheezing or hemoptysis.  CARDIOVASCULAR: No chest pain, orthopnea, edema.  GASTROINTESTINAL: No nausea, vomiting, diarrhea or abdominal pain.  GENITOURINARY: Positive for suprapubic pressure, dysuria and difficulty urinating.  No hematuria.  ENDOCRINE: No polyuria, nocturia,  HEMATOLOGY: No bleeding SKIN: No rash or lesion. MUSCULOSKELETAL: Positive history of osteoarthritis.   NEUROLOGIC: No tingling, numbness, weakness.  PSYCHIATRY: No anxiety or depression.   MEDICATIONS AT HOME:  Prior to Admission medications   Medication Sig Start Date End Date Taking? Authorizing Provider  pantoprazole (PROTONIX) 40 MG tablet TAKE 1 TABLET (40 MG TOTAL) BY MOUTH DAILY BEFORE BREAKFAST. 09/01/17  Yes Midge Minium, MD  levofloxacin (LEVAQUIN) 500 MG tablet Take 1 tablet (500 mg total) by mouth daily for 21 days. 11/06/17 11/27/17  Willy Eddyobinson, Patrick, MD  tamsulosin (FLOMAX) 0.4 MG CAPS capsule Take 1 capsule (0.4 mg total) by mouth daily after supper. 11/06/17   Willy Eddyobinson, Patrick, MD      PHYSICAL EXAMINATION:   VITAL SIGNS: Blood pressure 136/78, pulse (!) 101, temperature 98.2 F (36.8 C), temperature source Oral, resp. rate 18, height 5\' 10"  (1.778 m), weight 80.8 kg (178 lb 1.6 oz), SpO2 94 %.  GENERAL:  67 y.o.-year-old patient lying in the bed, in mild distress, secondary to suprapubic pressure.  EYES: Pupils equal, round, reactive to light and accommodation. No scleral icterus. Extraocular muscles intact.  HEENT: Head atraumatic, normocephalic. Oropharynx and nasopharynx clear.  NECK:  Supple, no jugular venous distention. No thyroid enlargement, no tenderness.  LUNGS: Normal breath sounds bilaterally, no wheezing, rales,rhonchi or crepitation. No use of accessory  muscles of respiration.  CARDIOVASCULAR: S1, S2 normal.  No S3/S4.  ABDOMEN: There is tenderness with palpation in the suprapubic area.  Otherwise, the abdomen is soft, nondistended. Bowel sounds present. No organomegaly or mass.  EXTREMITIES: No pedal edema, cyanosis, or clubbing.  NEUROLOGIC: Cranial nerves II through XII are intact. Muscle strength 5/5 in all extremities. Sensation intact. Gait not checked.  PSYCHIATRIC: The patient is alert and oriented x 3.  SKIN: No obvious rash.   LABORATORY PANEL:   CBC Recent Labs  Lab 11/06/17 2138  WBC 23.3*  HGB 16.7  HCT 50.7  PLT 170  MCV 86.5  MCH 28.6  MCHC 33.0  RDW 14.3   ------------------------------------------------------------------------------------------------------------------  Chemistries  Recent Labs  Lab 11/06/17 2138  NA 132*  K 4.0  CL 101  CO2 20*  GLUCOSE 119*  BUN 26*  CREATININE 1.53*  CALCIUM 9.4   ------------------------------------------------------------------------------------------------------------------ estimated creatinine clearance is 49.7 mL/min (A) (by C-G formula based on SCr of 1.53 mg/dL (H)). ------------------------------------------------------------------------------------------------------------------ No results for input(s): TSH, T4TOTAL, T3FREE, THYROIDAB in the last 72 hours.  Invalid input(s): FREET3   Coagulation profile No results for input(s): INR, PROTIME in the last 168 hours. ------------------------------------------------------------------------------------------------------------------- No results for input(s): DDIMER in the last 72 hours. -------------------------------------------------------------------------------------------------------------------  Cardiac Enzymes No results for input(s): CKMB, TROPONINI, MYOGLOBIN in the last 168 hours.  Invalid input(s):  CK ------------------------------------------------------------------------------------------------------------------ Invalid input(s): POCBNP  ---------------------------------------------------------------------------------------------------------------  Urinalysis    Component Value Date/Time   COLORURINE YELLOW (A) 11/06/2017 2138   APPEARANCEUR HAZY (A) 11/06/2017 2138   LABSPEC 1.015 11/06/2017 2138   PHURINE 5.0 11/06/2017 2138   GLUCOSEU NEGATIVE 11/06/2017 2138   HGBUR MODERATE (A) 11/06/2017 2138   BILIRUBINUR NEGATIVE 11/06/2017 2138   KETONESUR 5 (A) 11/06/2017 2138   PROTEINUR NEGATIVE 11/06/2017 2138   UROBILINOGEN 0.2 09/03/2010 0849   NITRITE NEGATIVE 11/06/2017 2138   LEUKOCYTESUR SMALL (A) 11/06/2017 2138     RADIOLOGY: No results found.  EKG: Orders placed or performed during the hospital encounter of 09/18/15  . ED EKG  . ED EKG  . EKG 12-Lead  . EKG 12-Lead  . EKG    IMPRESSION AND PLAN:  1.  Acute prostatitis.  We will start Levaquin and Flomax.  Continue to monitor blood cultures and urine culture results.  2.  Acute urinary retention, secondary to acute prostatitis.  Foley catheter was placed.  Patient was started on Levaquin antibiotic, IV fluids and Flomax.  3.  Acute UTI, started on Levaquin, while pending final urine culture result. 4.  CKD 3, stable.  Creatinine is 1.53, at baseline.  Continue to monitor kidney function closely and avoid nephrotoxic medications.  All the records are reviewed and case discussed with ED provider. Management plans discussed with the patient, family and they are in agreement.  CODE STATUS:    Code Status Orders  (From admission, onward)        Start     Ordered   11/07/17 0237  Full code  Continuous     11/07/17 0236    Code Status History    Date Active Date Inactive Code Status Order ID Comments User Context   09/18/2015 04:58 09/19/2015 14:10 Full Code [id number]  Oralia Manis, MD Inpatient    04/19/2015 02:05 04/22/2015 20:27 Full Code [id number]  Natale Lay, MD Inpatient   02/04/2015 23:12 02/05/2015 13:10 Full Code [id number]  Jethro Bolus, MD Inpatient       TOTAL TIME TAKING CARE OF THIS PATIENT: 45 minutes.    Cammy Copa M.D on 11/07/2017 at 5:00 AM  Between 7am to 6pm - Pager - [phone]  After 6pm go to [email or web address] - password [email or web address] [facility]  [facility]  Office  [phone]  CC: Primary care physician; [name], MD

## 2017-11-07 NOTE — ED Notes (Signed)
Foley anchor applied to pt right leg after preparing for movement to room (transport) foley pulled against pt

## 2017-11-09 ENCOUNTER — Other Ambulatory Visit: Payer: Self-pay

## 2017-11-09 ENCOUNTER — Encounter: Payer: Self-pay | Admitting: Emergency Medicine

## 2017-11-09 DIAGNOSIS — Z79899 Other long term (current) drug therapy: Secondary | ICD-10-CM | POA: Insufficient documentation

## 2017-11-09 DIAGNOSIS — Z87891 Personal history of nicotine dependence: Secondary | ICD-10-CM | POA: Insufficient documentation

## 2017-11-09 DIAGNOSIS — K5900 Constipation, unspecified: Secondary | ICD-10-CM | POA: Diagnosis not present

## 2017-11-09 DIAGNOSIS — K625 Hemorrhage of anus and rectum: Secondary | ICD-10-CM | POA: Insufficient documentation

## 2017-11-09 DIAGNOSIS — R109 Unspecified abdominal pain: Secondary | ICD-10-CM | POA: Diagnosis not present

## 2017-11-09 DIAGNOSIS — N183 Chronic kidney disease, stage 3 (moderate): Secondary | ICD-10-CM | POA: Diagnosis not present

## 2017-11-09 DIAGNOSIS — N132 Hydronephrosis with renal and ureteral calculous obstruction: Secondary | ICD-10-CM | POA: Diagnosis not present

## 2017-11-09 LAB — COMPREHENSIVE METABOLIC PANEL
ALT: 19 U/L (ref 17–63)
AST: 33 U/L (ref 15–41)
Albumin: 3.6 g/dL (ref 3.5–5.0)
Alkaline Phosphatase: 70 U/L (ref 38–126)
Anion gap: 9 (ref 5–15)
BUN: 22 mg/dL — ABNORMAL HIGH (ref 6–20)
CO2: 23 mmol/L (ref 22–32)
Calcium: 9.1 mg/dL (ref 8.9–10.3)
Chloride: 108 mmol/L (ref 101–111)
Creatinine, Ser: 1.47 mg/dL — ABNORMAL HIGH (ref 0.61–1.24)
GFR calc Af Amer: 56 mL/min — ABNORMAL LOW (ref >60)
GFR calc non Af Amer: 48 mL/min — ABNORMAL LOW (ref >60)
Glucose, Bld: 124 mg/dL — ABNORMAL HIGH (ref 65–99)
Potassium: 3.5 mmol/L (ref 3.5–5.1)
Sodium: 140 mmol/L (ref 135–145)
Total Bilirubin: 0.7 mg/dL (ref 0.3–1.2)
Total Protein: 7.1 g/dL (ref 6.5–8.1)

## 2017-11-09 LAB — CBC
HCT: 47.8 % (ref 40.0–52.0)
Hemoglobin: 15.8 g/dL (ref 13.0–18.0)
MCH: 28.7 pg (ref 26.0–34.0)
MCHC: 33 g/dL (ref 32.0–36.0)
MCV: 87.1 fL (ref 80.0–100.0)
Platelets: 190 10*3/uL (ref 150–440)
RBC: 5.49 MIL/uL (ref 4.40–5.90)
RDW: 13.9 % (ref 11.5–14.5)
WBC: 7.8 10*3/uL (ref 3.8–10.6)

## 2017-11-09 NOTE — ED Triage Notes (Signed)
Patient ambulatory to triage with steady gait, without difficulty or distress noted; pt reports d/c Monday for placement of foley cath; since has had hemorrhoids; st had urge to have BM and subsequent "heavy" bleeding since; denies hx of same; denies abd pain

## 2017-11-09 NOTE — Discharge Summary (Signed)
[facility] [place] at [facility]   PATIENT NAME: Anthony [name]    MR#:  [id number]  DATE OF BIRTH:  06/29/1951  DATE OF ADMISSION:  11/06/2017   ADMITTING PHYSICIAN: Cammy CopaAngela Maier, MD  DATE OF DISCHARGE: 11/07/2017  2:43 PM  PRIMARY CARE PHYSICIAN: Kaleen MaskElkins, Wilson Oliver, MD   ADMISSION DIAGNOSIS:  Acute prostatitis [N41.0] Acute urinary retention [R33.8] DISCHARGE DIAGNOSIS:  Active Problems:   Acute prostatitis  SECONDARY DIAGNOSIS:   Past Medical History:  Diagnosis Date  . Arthritis    hands  . Bilateral ureteral calculi   . Chronic back pain   . DDD (degenerative disc disease), lumbosacral   . GERD (gastroesophageal reflux disease)   . H/O mitral valve prolapse 2008 per echo   asymptomatic and does not see cardiologist.   . Hematuria   . History of kidney stones   . 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 Renal calculi    left  . Renal insufficiency    HOSPITAL COURSE:  67 y.o. male with a known history of osteoarthritis , kidney stones and CKD 3 admitted for acute onset of dysuria and suprapubic pressure started in the past 2 days, gradually getting worse.  For the past 24 hours, patient has had difficulty emptying the urinary bladder.    1.  Acute prostatitis.  improving on Levaquin and Flomax. Urine c/s growing Klebsiella 2.  Acute urinary retention, secondary to acute prostatitis.  Foley catheter was placed and instructed to keep it indwelling till f/up with Urology as an outpt and continue Flomax.  3.  Acute Klebsiella UTI: improving on Levaquin 4.  CKD 3, stable.  Creatinine is 1.53, at baseline.  DISCHARGE CONDITIONS:  stable CONSULTS OBTAINED:   DRUG ALLERGIES:   Allergies  Allergen Reactions  . Cheese Other (See Comments)    cramps  . Codeine Itching  . Lactose Intolerance (Gi) Other (See Comments)    GI distress   DISCHARGE MEDICATIONS:   Allergies as of 11/07/2017    Reactions   Cheese Other (See Comments)   cramps   Codeine Itching   Lactose Intolerance (gi) Other (See Comments)   GI distress      Medication List    TAKE these medications   levofloxacin 500 MG tablet Commonly known as:  LEVAQUIN Take 1 tablet (500 mg total) by mouth daily.   pantoprazole 40 MG tablet Commonly known as:  PROTONIX TAKE 1 TABLET (40 MG TOTAL) BY MOUTH DAILY BEFORE BREAKFAST.   tamsulosin 0.4 MG Caps capsule Commonly known as:  FLOMAX Take 1 capsule (0.4 mg total) by mouth daily.        DISCHARGE INSTRUCTIONS:  Keep foley indwelling until follow up with Urology as an outpt as scheduled DIET:  Regular diet DISCHARGE CONDITION:  Good ACTIVITY:  Activity as tolerated OXYGEN:  Home Oxygen: No.  Oxygen Delivery: room air DISCHARGE LOCATION:  home   If you experience worsening of your admission symptoms, develop shortness of breath, life threatening emergency, suicidal or homicidal thoughts you must seek medical attention immediately by calling 911 or calling your MD immediately  if symptoms less severe.  You Must read complete instructions/literature along with all the possible adverse reactions/side effects for all the Medicines you take and that have been prescribed to you. Take any new Medicines after you have completely understood and accpet all the possible adverse reactions/side effects.   Please note  You were cared for by a hospitalist during your hospital stay. If you have any questions about your discharge medications or the care you received while you were in the hospital after you are discharged, you can call the unit and asked to speak with the hospitalist on call if the hospitalist that took care of you is not available. Once you are discharged, your primary care physician will handle any further medical issues. Please note that NO REFILLS for any discharge medications will be authorized once you are discharged, as it is imperative that you  return to your primary care physician (or establish a relationship with a primary care physician if you do not have one) for your aftercare needs so that they can reassess your need for medications and monitor your lab values.    On the day of Discharge:  VITAL SIGNS:  Blood pressure (!) 142/78, pulse (!) 102, temperature 98.7 F (37.1 C), temperature source Oral, resp. rate 18, height 5\' 10"  (1.778 m), weight 80.8 kg (178 lb 1.6 oz), SpO2 96 %. PHYSICAL EXAMINATION:  GENERAL:  67 y.o.-year-old patient lying in the bed with no acute distress.  EYES: Pupils equal, round, reactive to light and accommodation. No scleral icterus. Extraocular muscles intact.  HEENT: Head atraumatic, normocephalic. Oropharynx and nasopharynx clear.  NECK:  Supple, no jugular venous distention. No thyroid enlargement, no tenderness.  LUNGS: Normal breath sounds bilaterally, no wheezing, rales,rhonchi or crepitation. No use of accessory muscles of respiration.  CARDIOVASCULAR: S1, S2 normal. No murmurs, rubs, or gallops.  ABDOMEN: Soft, non-tender, non-distended. Bowel sounds present. No organomegaly or mass.  EXTREMITIES: No pedal edema, cyanosis, or clubbing.  NEUROLOGIC: Cranial nerves II through XII are intact. Muscle strength 5/5 in all extremities. Sensation intact. Gait not checked.  PSYCHIATRIC: The patient is alert and oriented x 3.  SKIN: No obvious rash, lesion, or ulcer.  DATA REVIEW:   CBC Recent Labs  Lab 11/07/17 0452  WBC 17.8*  HGB 14.7  HCT 44.1  PLT 141*    Chemistries  Recent Labs  Lab 11/07/17 0452  NA 135  K 3.9  CL 105  CO2 21*  GLUCOSE 101*  BUN 25*  CREATININE 1.44*  CALCIUM 8.6*     Follow-up Information    Bjorn Pippin, MD. Go on 11/14/2017.   Specialty:  Urology Why:  Monday, 3/25 @ 8 am Contact information: [address] [place] [address] [phone]        [name], MD. Schedule an appointment as soon as possible for a visit in 1 week(s).    Specialty:  Family Medicine Why:  Please call to schedule followup visit in one week [phone] Contact information: [address] [place] [address] [phone]            Management plans discussed with the patient, family and they are in agreement.  CODE STATUS: Prior   TOTAL TIME TAKING CARE OF THIS PATIENT: 45 minutes.    Delfino Lovett M.D on 11/09/2017 at 6:22 PM  Between 7am to 6pm - Pager - [phone]  After 6pm go to [email or web address] - Social research officer, government  [facility] [place] [facility]  Office  [phone]  CC: Primary care physician; [name], MD   Note: This dictation was prepared with Dragon dictation along with smaller phrase technology. Any transcriptional errors that result from this process are unintentional.

## 2017-11-10 ENCOUNTER — Emergency Department: Payer: BLUE CROSS/BLUE SHIELD

## 2017-11-10 ENCOUNTER — Emergency Department
Admission: EM | Admit: 2017-11-10 | Discharge: 2017-11-10 | Disposition: A | Discharge status: Home / Self Care | Payer: BLUE CROSS/BLUE SHIELD | Attending: Emergency Medicine | Admitting: Emergency Medicine

## 2017-11-10 DIAGNOSIS — N132 Hydronephrosis with renal and ureteral calculous obstruction: Secondary | ICD-10-CM | POA: Diagnosis not present

## 2017-11-10 DIAGNOSIS — K5900 Constipation, unspecified: Secondary | ICD-10-CM

## 2017-11-10 DIAGNOSIS — R109 Unspecified abdominal pain: Secondary | ICD-10-CM

## 2017-11-10 DIAGNOSIS — K625 Hemorrhage of anus and rectum: Secondary | ICD-10-CM

## 2017-11-10 LAB — TYPE AND SCREEN
ABO/RH(D): O NEG
Antibody Screen: NEGATIVE

## 2017-11-10 LAB — URINE CULTURE: Culture: >=100000 — AB

## 2017-11-10 MED ORDER — IOPAMIDOL (ISOVUE-370) INJECTION 76%
100.0000 mL | Freq: Once | INTRAVENOUS | Status: AC | PRN
  Administered 2017-11-10: 100 mL via INTRAVENOUS

## 2017-11-10 MED ORDER — ONDANSETRON HCL 4 MG/2ML IJ SOLN
4.0000 mg | INTRAMUSCULAR | Status: AC
  Administered 2017-11-10: 4 mg via INTRAVENOUS
  Filled 2017-11-10: qty 2

## 2017-11-10 MED ORDER — MORPHINE SULFATE (PF) 4 MG/ML IV SOLN
4.0000 mg | Freq: Once | INTRAVENOUS | Status: AC
  Administered 2017-11-10: 4 mg via INTRAVENOUS
  Filled 2017-11-10: qty 1

## 2017-11-10 NOTE — Discharge Instructions (Addendum)
As we discussed, your work-up tonight was reassuring and we think that your abdominal discomfort is most likely due to constipation.  Although you do have some rectal bleeding, it is likely related to your hemorrhoid.  We encourage you to follow-up with your gastroenterologist, Dr. Servando SnareWohl, at the next available opportunity.  For your constipation, we recommend that you use one or more of the following over-the-counter medications in the order described:   1)  Colace (or Dulcolax) 100 mg:  This is a stool softener, and you may take it once or twice a day as needed. 2)  Senna tablets:  This is a bowel stimulant that will help "push" out your stool. It is the next step to add after you have tried a stool softener. 3)  Miralax (powder):  This medication works by drawing additional fluid into your intestines and helps to flush out your stool.  Mix the powder with water or juice according to label instructions.  It may help if the Colace and Senna are not sufficient, but you must be sure to use the recommended amount of water or juice when you mix up the powder. 4)  Look for magnesium citrate at the pharmacy (it is usually a small glass bottle).  Drink the bottle according to the label instructions.  Remember that narcotic pain medications are constipating, so avoid them or minimize their use.  Drink plenty of fluids.  Return to the emergency department if you develop new or worsening symptoms that concern you.

## 2017-11-10 NOTE — ED Provider Notes (Signed)
[facility] Emergency Department Provider Note  ____________________________________________   First MD Initiated Contact with Patient 11/10/17 0012     (approximate)  I have reviewed the triage vital signs and the nursing notes.   HISTORY  Chief Complaint Rectal Bleeding    HPI Anthony [name] is a 67 y.o. male with medical history as listed below most notable for recent onset of urinary retention for which he still has an indwelling Foley catheter as well as a recent complaint of constipation.  He presents tonight for evaluation of acute onset rectal bleeding over the last 24+ hours.  He reports that he has a large hemorrhoid which occasionally gives him problems.  He does not have a history of rectal bleeding, however.  He says that he had a colonoscopy about 2 years ago and the doctor (Dr. Servando SnareWohl) did not report to him anything particularly bad or concerning.  He states that he started to have some minimal rectal bleeding yesterday and then had a higher volume of bleeding when he was wiping after attempting to have a bowel movement and straining heavily.  He occasionally has had blood on the toilet paper each time he is wiped since then.  He has not had a large volume of blood passed per rectum.  He has had intermittent abdominal cramping which can be severe at times but generally has not been bothering him.  He denies fever/chills, chest pain, shortness of breath, nausea, vomiting.  He states he has been taking Colace and recently a suppository to help him have bowel movements.  He has been doing okay on the Foley catheter and has been taking antibiotics for a urinary tract infection.  He has a follow-up appointment scheduled with his regular doctor and he is planning to see urology to have the Foley catheter removed.  He describes the rectal bleeding as severe and nothing in particular makes it better and straining to have a bowel movement seems to make it  worse.  Past Medical History:  Diagnosis Date  . Arthritis    hands  . Bilateral ureteral calculi   . Chronic back pain   . DDD (degenerative disc disease), lumbosacral   . GERD (gastroesophageal reflux disease)   . H/O mitral valve prolapse 2008 per echo   asymptomatic and does not see cardiologist.   . Hematuria   . History of kidney stones   . 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 Renal calculi    left  . Renal insufficiency     Patient Active Problem List   Diagnosis Date Noted  . Acute prostatitis 11/07/2017  . Nausea   . Hiatal hernia   . Gastritis   . Elevated transaminase level 09/18/2015  . Hyperbilirubinemia 09/18/2015  . GERD (gastroesophageal reflux disease) 09/18/2015  . BPH (benign prostatic hyperplasia) 09/18/2015  . Special screening for malignant neoplasms, colon   . Second degree hemorrhoids   . Acute cholecystitis 04/19/2015  . Cholecystitis   . Epigastric abdominal pain   . Right ureteral calculus 02/05/2015  . Left ureteral calculus 02/05/2015  . CKD (chronic kidney disease), stage III (HCC) 02/05/2015  . Ureteral stone 02/04/2015    Past Surgical History:  Procedure Laterality Date  . ANTERIOR CERVICAL DECOMP/DISCECTOMY FUSION  C5--C7  06-05-2002//  C7--T1   03-29-2006  . BICEPS TENDON REPAIR Right 2004 approx  . COLONOSCOPY WITH PROPOFOL N/A 06/27/2015   Procedure: COLONOSCOPY  WITH PROPOFOL;  Surgeon: Midge Minium, MD;  Location: [facility];  Service: Endoscopy;  Laterality: N/A;  . CYSTO/  RIGHT RETROGRADE PYELOGRAM/ URETEROSCOPY ATTEMPTED STONE MANIPULATION/  RIGHT STENT PLACEMENT  03-03-2009  . CYSTOSCOPY WITH RETROGRADE PYELOGRAM, URETEROSCOPY AND STENT PLACEMENT Right 01/24/2014   Procedure: CYSTOSCOPY WITH RETROGRADE PYELOGRAM, URETEROSCOPY AND STENT PLACEMENT, holmium laser, stone extraction;  Surgeon: Danae Chen, MD;  Location: [facility];  Service: Urology;   Laterality: Right;  . CYSTOSCOPY WITH RETROGRADE PYELOGRAM, URETEROSCOPY AND STENT PLACEMENT Right 02/07/2014   Procedure: RIGHT URETEROSCOPY, STONE EXTRACTION AND POSSIBLE STENT PLACEMENT;  Surgeon: Anner Crete, MD;  Location[facility];  Service: Urology;  Laterality: Right;  . CYSTOSCOPY WITH RETROGRADE PYELOGRAM, URETEROSCOPY AND STENT PLACEMENT Bilateral 02/04/2015   Procedure: CYSTOSCOPY WITH RETROGRADE PYELOGRAM, URETEROSCOPY AND STENT PLACEMENT, BILATERAL;  Surgeon: Jethro Bolus, MD;  Location: [facility];  Service: Urology;  Laterality: Bilateral;  . CYSTOSCOPY WITH URETEROSCOPY, STONE BASKETRY AND STENT PLACEMENT Bilateral 02/20/2015   Procedure: CYSTOSCOPY WITH BILATERAL URETEROSCOPY, STONE EXTRACTION WITH LASER  AND STENT PLACEMENT;  Surgeon: Bjorn Pippin, MD;  Location: [facility];  Service: Urology;  Laterality: Bilateral;  . ESOPHAGOGASTRODUODENOSCOPY (EGD) WITH PROPOFOL N/A 10/17/2015   Procedure: ESOPHAGOGASTRODUODENOSCOPY (EGD) WITH PROPOFOL;  Surgeon: Midge Minium, MD;  Location: [facility];  Service: Endoscopy;  Laterality: N/A;  . EXTRACORPOREAL SHOCK WAVE LITHOTRIPSY  Left 02-13-2015//  Right 01-17-2014  . HOLMIUM LASER APPLICATION Right 02/07/2014   Procedure: HOLMIUM LASER APPLICATION;  Surgeon: Anner Crete, MD;  Location: [facility];  Service: Urology;  Laterality: Right;  . HOLMIUM LASER APPLICATION N/A 02/20/2015   Procedure: HOLMIUM LASER APPLICATION;  Surgeon: Bjorn Pippin, MD;  Location: [facility];  Service: Urology;  Laterality: N/A;  . LAPAROSCOPIC CHOLECYSTECTOMY SINGLE PORT N/A 04/19/2015   Procedure: LAPAROSCOPIC CHOLECYSTECTOMY;  Surgeon: Lattie Haw, MD;  Location: [facility];  Service: General;  Laterality: N/A;  . LEFT URETEROSCOPIC LASER LITHOTRIPSY STONE EXTRACTION W/ STENT PLACEMENT  03-09-2011;   09-18-2009  . LEFT URETEROSCOPIC STONE EXTRACTION W/ STENT  09-25-2009  . LUMBAR LAMINECTOMY   x2  ----  L3 - L5//  L5 -- S1  . POSTERIOR FUSION LUMBAR SPINE  09-08-2010   Re-do Laminectomy L3-S1 w/  Decompression and fusion   . RIGHT URETEROSCOPIC STONE EXTRACTION  03-13-2009  . TRANSTHORACIC ECHOCARDIOGRAM  07-04-2007   Mild focal basal septal hypertrophy/  ef 60%/  mild MVP involving anterior and posterior leaflets w/ trivial MR/  trivial pericardial effusion posterior to the heart    Prior to Admission medications   Medication Sig Start Date End Date Taking? Authorizing Provider  levofloxacin (LEVAQUIN) 500 MG tablet Take 1 tablet (500 mg total) by mouth daily. 11/07/17  Yes Delfino Lovett, MD  pantoprazole (PROTONIX) 40 MG tablet TAKE 1 TABLET (40 MG TOTAL) BY MOUTH DAILY BEFORE BREAKFAST. 09/01/17  Yes Midge Minium, MD  tamsulosin (FLOMAX) 0.4 MG CAPS capsule Take 1 capsule (0.4 mg total) by mouth daily. 11/08/17  Yes Delfino Lovett, MD    Allergies Cheese; Codeine; and Lactose intolerance (gi)  Family History  Problem Relation Age of Onset  . Cancer Mother   . Diabetes Mother   . Hypertension Mother   . Stroke Mother   . Colon cancer Mother   . Hypertension Brother   . Colon cancer Brother        Pre-cancerous Polyps removed  . Kidney disease Brother  Chronic- secondary to Hypertension  . Stroke Father   . Thyroid disease Daughter   . Colon cancer Maternal Aunt        x 2  . Colon cancer Maternal Uncle     Social History Social History   Tobacco Use  . Smoking status: Former Smoker    Types: Pipe    Last attempt to quit: 02/17/2000    Years since quitting: 17.7  . Smokeless tobacco: Never Used  Substance Use Topics  . Alcohol use: Yes    Alcohol/week: 0.6 oz    Types: 1 Shots of liquor per week    Comment: occasionally   . Drug use: No    Review of Systems Constitutional: No fever/chills Eyes: No visual changes. ENT: No sore throat. Cardiovascular: Denies chest pain. Respiratory: Denies shortness of breath. Gastrointestinal: Bright red blood per  rectum .  Intermittent abdominal cramping.  No nausea, no vomiting.  No diarrhea.  +constipation. Genitourinary: Recent ED visit for acute urinary retention requiring indwelling Foley catheter. Musculoskeletal: Negative for neck pain.  Negative for back pain. Integumentary: Negative for rash. Neurological: Negative for headaches, focal weakness or numbness.   ____________________________________________   PHYSICAL EXAM:  VITAL SIGNS: ED Triage Vitals  Enc Vitals Group     BP 11/09/17 2300 128/87     Pulse Rate 11/09/17 2300 (!) 104     Resp 11/09/17 2300 18     Temp 11/09/17 2300 98 F (36.7 C)     Temp Source 11/09/17 2300 Oral     SpO2 11/09/17 2300 98 %     Weight 11/09/17 2300 81.2 kg (179 lb)     Height 11/09/17 2300 1.778 m (5\' 10" )     Head Circumference --      Peak Flow --      Pain Score 11/10/17 0009 8     Pain Loc --      Pain Edu? --      Excl. in GC? --     Constitutional: Alert and oriented. Well appearing and in no acute distress. Eyes: Conjunctivae are normal.  Head: Atraumatic. Nose: No congestion/rhinnorhea. Mouth/Throat: Mucous membranes are moist. Neck: No stridor.  No meningeal signs.   Cardiovascular: Normal rate, regular rhythm. Good peripheral circulation. Grossly normal heart sounds. Respiratory: Normal respiratory effort.  No retractions. Lungs CTAB. Gastrointestinal: Soft and nontender. No distention.  Rectal: Large nonthrombosed external hemorrhoid, severely tender to palpation but soft.  Pain with digital rectal exam most likely due to the hemorrhoid.  Minimal gross bright red blood on digital exam, Hemoccult positive. Musculoskeletal: No lower extremity tenderness nor edema. No gross deformities of extremities. Neurologic:  Normal speech and language. No gross focal neurologic deficits are appreciated.  Skin:  Skin is warm, dry and intact. No rash noted. Psychiatric: Mood and affect are normal. Speech and behavior are  normal.  ____________________________________________   LABS (all labs ordered are listed, but only abnormal results are displayed)  Labs Reviewed  COMPREHENSIVE METABOLIC PANEL - Abnormal; Notable for the following components:      Result Value   Glucose, Bld 124 (*)    BUN 22 (*)    Creatinine, Ser 1.47 (*)    GFR calc non Af Amer 48 (*)    GFR calc Af Amer 56 (*)    All other components within normal limits  CBC  TYPE AND SCREEN   ____________________________________________  EKG  No indication for EKG ____________________________________________  RADIOLOGY  ED MD interpretation: Signs of  cystitis, signs of constipation, no obvious evidence of bowel mass and no evidence of acute GI bleeding on this special GI bleeding protocol CT angio  Official radiology report(s): Ct Angio Abd/pel W/ And/or W/o  Result Date: 11/10/2017 CLINICAL DATA:  Intermittent abdominal pain. New onset GI bleeding, concern for possible bowel mass. New onset urinary retention. EXAM: CTA ABDOMEN AND PELVIS wITHOUT AND WITH CONTRAST TECHNIQUE: Multidetector CT imaging of the abdomen and pelvis was performed using the standard protocol during bolus administration of intravenous contrast. Multiplanar reconstructed images and MIPs were obtained and reviewed to evaluate the vascular anatomy. CONTRAST:  ISOVUE-370 IOPAMIDOL (ISOVUE-370) INJECTION 76% COMPARISON:  CT 05/05/2015 FINDINGS: VASCULAR Aorta: Normal caliber aorta without aneurysm, dissection, vasculitis or significant stenosis. Celiac: Patent without evidence of aneurysm, dissection, vasculitis or significant stenosis. SMA: Patent without evidence of aneurysm, dissection, vasculitis or significant stenosis. Incidental replaced right hepatic artery. Renals: Both renal arteries are patent without evidence of aneurysm, dissection, vasculitis, fibromuscular dysplasia or significant stenosis. IMA: Patent without evidence of aneurysm, dissection,  vasculitis or significant stenosis. Inflow: Patent without evidence of aneurysm, dissection, vasculitis or significant stenosis. Proximal Outflow: Bilateral common femoral and visualized portions of the superficial and profunda femoral arteries are patent without evidence of aneurysm, dissection, vasculitis or significant stenosis. Veins: No venous abnormality. Portal and mesenteric veins are patent. The IVC is unremarkable. Review of the MIP images confirms the above findings. NON-VASCULAR Lower chest: Mild bibasilar atelectasis. No consolidation or pleural fluid. Moderate hiatal hernia. Hepatobiliary: No focal hepatic lesion. Clips in the gallbladder fossa postcholecystectomy. No biliary dilatation. Pancreas: No ductal dilatation or inflammation. Spleen: Normal in size without focal abnormality. Adrenals/Urinary Tract: Normal adrenal glands. Bilateral hydronephrosis which appears chronic and unchanged from prior exam. Nonobstructing stones in the upper left kidney. There is homogeneous renal enhancement. Only minimal bilateral perinephric edema. Urinary bladder is decompressed by Foley catheter, there is bladder wall thickening and perivesicular edema. Stomach/Bowel: No contrast extravasation to localize site of GI bleed. Colonic diverticulosis without diverticulitis. Moderate stool burden. No colonic wall thickening. No obvious colonic or bowel mass allowing for lack of enteric contrast. No small bowel dilatation or inflammation. Normal appendix. Moderate hiatal hernia. Lymphatic: No enlarged abdominal or pelvic lymph nodes. Reproductive: Prominent prostate gland. Other: No ascites or free air.  No intra-abdominal abscess. Musculoskeletal: Postsurgical and degenerative change in the lumbar spine. There are no acute or suspicious osseous abnormalities. IMPRESSION: VASCULAR 1. No bowel extravasation to localize site of GI bleed. 2. 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 NON-VASCULAR 1. Colonic  diverticulosis without diverticulitis. No obvious colonic or bowel mass allowing for lack of enteric contrast. Normal appendix. 2. Urinary bladder decompressed by Foley catheter, there is bladder wall thickening and perivesicular edema, concerning for cystitis. 3. Bilateral hydronephrosis, which is chronic. Nonobstructing left renal stone. 4. Moderate hiatal hernia. 5. Prominent prostate gland. Electronically Signed   By: Rubye Oaks M.D.   On: 11/10/2017 03:12    ____________________________________________   PROCEDURES  Critical Care performed: No   Procedure(s) performed:   Procedures   ____________________________________________   INITIAL IMPRESSION / ASSESSMENT AND PLAN / ED COURSE  As part of my medical decision making, I reviewed the following data within the electronic MEDICAL RECORD NUMBER Nursing notes reviewed and incorporated, Labs reviewed  and Notes from prior ED visits  Differential diagnosis includes, but is not limited to, hemorrhoidal bleeding, bowel mass including neoplasm, diverticular bleed, AV malformation, etc.  The patient is well-appearing in no acute  distress.  His lab work was notable for slightly elevated creatinine at 1.47 and a BUN of 22 but otherwise his workup was reassuring including a normal hemoglobin.  His lab work is all within normal limits except for some mild hypertension.  He has no tachycardia and no evidence of acute infection.  Originally my plan was to obtain a CT scan with oral and IV contrast to look for any obvious bleeding sources including the possibility of a mass; I was concerned given that he has this new acute urinary obstruction requiring a catheter, recent constipation, and now some rectal bleeding and I thought it would be worthwhile to rule out a mass.  However, the radiology technologist pointed out that there is a new GI bleeding protocol for CT angio abd/pelvis, so I ordered that one thinking that would give [place] the most  comprehensive look.  There is no obvious source of the bleeding on the scan.  He reported to me that he is already taking medication for his cystitis and he has follow-up scheduled with urology.  He is well-appearing and in no acute distress and no pain after the workup after I came back to talk to him about the findings.  I explained about the constipation and he was not surprised.  We discussed the possibility of adding Senokot and MiraLAX to his regimen.  Given that he is an established patient with Dr. Servando Snare and is hemodynamically stable at this time, I do not feel that he would benefit from admission and he does not want to stay in the hospital.  He also reports that he had a colonoscopy within the last 2 years which is also reassuring.  I encouraged him to call the GI doctors office to schedule the next available follow-up appointment and  I gave my usual and customary return precautions.  He understands and agrees with the plan.     ____________________________________________  FINAL CLINICAL IMPRESSION(S) / ED DIAGNOSES  Final diagnoses:  Rectal bleeding  Abdominal pain, unspecified abdominal location  Constipation, unspecified constipation type     MEDICATIONS GIVEN DURING THIS VISIT:  Medications  iopamidol (ISOVUE-370) 76 % injection 100 mL (100 mLs Intravenous Contrast Given 11/10/17 0153)  morphine 4 MG/ML injection 4 mg (4 mg Intravenous Given 11/10/17 0319)  ondansetron (ZOFRAN) injection 4 mg (4 mg Intravenous Given 11/10/17 0319)     ED Discharge Orders    None       Note:  This document was prepared using Dragon voice recognition software and may include unintentional dictation errors.    Loleta Rose, MD 11/10/17 [phone]

## 2017-11-10 NOTE — ED Notes (Signed)
Lab results reviewed. Awaiting room for MD eval.  

## 2017-11-14 DIAGNOSIS — N200 Calculus of kidney: Secondary | ICD-10-CM | POA: Diagnosis not present

## 2017-11-14 DIAGNOSIS — N1330 Unspecified hydronephrosis: Secondary | ICD-10-CM | POA: Diagnosis not present

## 2017-11-14 DIAGNOSIS — R338 Other retention of urine: Secondary | ICD-10-CM | POA: Diagnosis not present

## 2017-11-28 ENCOUNTER — Other Ambulatory Visit: Payer: Self-pay | Admitting: Gastroenterology

## 2017-12-21 ENCOUNTER — Other Ambulatory Visit: Payer: Self-pay

## 2017-12-26 DIAGNOSIS — N1330 Unspecified hydronephrosis: Secondary | ICD-10-CM | POA: Diagnosis not present

## 2017-12-26 DIAGNOSIS — N401 Enlarged prostate with lower urinary tract symptoms: Secondary | ICD-10-CM | POA: Diagnosis not present

## 2017-12-26 DIAGNOSIS — R351 Nocturia: Secondary | ICD-10-CM | POA: Diagnosis not present

## 2017-12-26 DIAGNOSIS — N200 Calculus of kidney: Secondary | ICD-10-CM | POA: Diagnosis not present

## 2018-01-09 DIAGNOSIS — N200 Calculus of kidney: Secondary | ICD-10-CM | POA: Diagnosis not present

## 2018-01-10 ENCOUNTER — Other Ambulatory Visit: Payer: Self-pay | Admitting: Gastroenterology

## 2018-03-15 LAB — HIV ANTIBODY (ROUTINE TESTING W REFLEX): HIV Screen 4th Generation wRfx: NONREACTIVE

## 2018-05-16 ENCOUNTER — Other Ambulatory Visit: Payer: Self-pay | Admitting: Gastroenterology

## 2018-06-26 DIAGNOSIS — R351 Nocturia: Secondary | ICD-10-CM | POA: Diagnosis not present

## 2018-06-26 DIAGNOSIS — N200 Calculus of kidney: Secondary | ICD-10-CM | POA: Diagnosis not present

## 2018-06-26 DIAGNOSIS — N401 Enlarged prostate with lower urinary tract symptoms: Secondary | ICD-10-CM | POA: Diagnosis not present

## 2018-09-26 DIAGNOSIS — M5136 Other intervertebral disc degeneration, lumbar region: Secondary | ICD-10-CM | POA: Diagnosis not present

## 2018-09-26 DIAGNOSIS — Z6827 Body mass index (BMI) 27.0-27.9, adult: Secondary | ICD-10-CM | POA: Diagnosis not present

## 2018-09-26 DIAGNOSIS — M5416 Radiculopathy, lumbar region: Secondary | ICD-10-CM | POA: Diagnosis not present

## 2018-10-16 DIAGNOSIS — M5136 Other intervertebral disc degeneration, lumbar region: Secondary | ICD-10-CM | POA: Diagnosis not present

## 2018-10-17 DIAGNOSIS — M5416 Radiculopathy, lumbar region: Secondary | ICD-10-CM | POA: Diagnosis not present

## 2018-10-17 DIAGNOSIS — M48061 Spinal stenosis, lumbar region without neurogenic claudication: Secondary | ICD-10-CM | POA: Diagnosis not present

## 2018-10-17 DIAGNOSIS — M5126 Other intervertebral disc displacement, lumbar region: Secondary | ICD-10-CM | POA: Diagnosis not present

## 2018-10-24 DIAGNOSIS — R030 Elevated blood-pressure reading, without diagnosis of hypertension: Secondary | ICD-10-CM | POA: Diagnosis not present

## 2018-10-24 DIAGNOSIS — M48062 Spinal stenosis, lumbar region with neurogenic claudication: Secondary | ICD-10-CM | POA: Diagnosis not present

## 2018-10-24 DIAGNOSIS — Z6828 Body mass index (BMI) 28.0-28.9, adult: Secondary | ICD-10-CM | POA: Diagnosis not present

## 2018-10-31 ENCOUNTER — Other Ambulatory Visit: Payer: Self-pay | Admitting: Neurosurgery

## 2018-11-03 ENCOUNTER — Other Ambulatory Visit: Payer: Self-pay | Admitting: Gastroenterology

## 2018-12-18 ENCOUNTER — Other Ambulatory Visit (HOSPITAL_COMMUNITY): Payer: BLUE CROSS/BLUE SHIELD

## 2018-12-20 DIAGNOSIS — N200 Calculus of kidney: Secondary | ICD-10-CM | POA: Diagnosis not present

## 2018-12-27 DIAGNOSIS — N401 Enlarged prostate with lower urinary tract symptoms: Secondary | ICD-10-CM | POA: Diagnosis not present

## 2018-12-27 DIAGNOSIS — R351 Nocturia: Secondary | ICD-10-CM | POA: Diagnosis not present

## 2018-12-27 DIAGNOSIS — N200 Calculus of kidney: Secondary | ICD-10-CM | POA: Diagnosis not present

## 2019-01-10 ENCOUNTER — Other Ambulatory Visit: Payer: Self-pay | Admitting: Neurosurgery

## 2019-01-24 NOTE — Pre-Procedure Instructions (Signed)
[id number] [place] [facility]- [place], [place] - [address] [place] [id number] Phone: [phone] Fax: [phone]      Your procedure is scheduled on January 29, 2019  [facility] to [facility] Main Entrance "A" at 1100 A.M., and check in at the Admitting office.  Call this number if you have problems the morning of surgery:  [phone]  Call [phone] if you have any questions prior to your surgery date Monday-Friday 8am-4pm    Remember:  Do not eat or drink after midnight.   Take these medicines the morning of surgery with A SIP OF WATER   Pantoprazole (Protonix)  Tamsulosin (Flomax)  7 days prior to surgery STOP taking any Aspirin (unless otherwise instructed by your surgeon), Aleve, Naproxen, Ibuprofen, Motrin, Advil, Goody's, BC's, all herbal medications, fish oil, and all vitamins.    The Morning of Surgery  Do not wear jewelry.  Do not wear lotions, powders, cologne, or deodorant  Men may shave face and neck.  Do not bring valuables to the hospital.  [facility] is not responsible for any belongings or valuables.  If you are a smoker, DO NOT Smoke 24 hours prior to surgery IF you wear a CPAP at night please bring your mask, tubing, and machine the morning of surgery   Remember that you must have someone to transport you home after your surgery, and remain with you for 24 hours if you are discharged the same day.   Contacts, glasses, hearing aids, dentures or bridgework may not be worn into surgery.    Leave your suitcase in the car.  After surgery it may be brought to your room.  For patients admitted to the hospital, discharge time will be determined by your treatment team.  Patients discharged the day of surgery will not be allowed to drive home.    Special instructions:   [place]- Preparing For Surgery  Before surgery, you can play an important role. Because skin is not sterile, your skin needs to be as free of germs as  possible. You can reduce the number of germs on your skin by washing with CHG (chlorahexidine gluconate) Soap before surgery.  CHG is an antiseptic cleaner which kills germs and bonds with the skin to continue killing germs even after washing.    Oral Hygiene is also important to reduce your risk of infection.  Remember - BRUSH YOUR TEETH THE MORNING OF SURGERY WITH YOUR REGULAR TOOTHPASTE  Please do not use if you have an allergy to CHG or antibacterial soaps. If your skin becomes reddened/irritated stop using the CHG.  Do not shave (including legs and underarms) for at least 48 hours prior to first CHG shower. It is OK to shave your face.  Please follow these instructions carefully.   1. Shower the NIGHT BEFORE SURGERY and the MORNING OF SURGERY with CHG Soap.   2. If you chose to wash your hair, wash your hair first as usual with your normal shampoo.  3. After you shampoo, rinse your hair and body thoroughly to remove the shampoo.  4. Use CHG as you would any other liquid soap. You can apply CHG directly to the skin and wash gently with a scrungie or a clean washcloth.   5. Apply the CHG Soap to your body ONLY FROM THE NECK DOWN.  Do not use on open wounds or open sores. Avoid contact with your eyes, ears, mouth and genitals (private parts). Wash Face and genitals (private  parts)  with your normal soap.   6. Wash thoroughly, paying special attention to the area where your surgery will be performed.  7. Thoroughly rinse your body with warm water from the neck down.  8. DO NOT shower/wash with your normal soap after using and rinsing off the CHG Soap.  9. Pat yourself dry with a CLEAN TOWEL.  10. Wear CLEAN PAJAMAS to bed the night before surgery, wear comfortable clothes the morning of surgery  11. Place CLEAN SHEETS on your bed the night of your first shower and DO NOT SLEEP WITH PETS.    Day of Surgery:  Do not apply any deodorants/lotions.  Please wear clean clothes to the  hospital/surgery center.   Remember to brush your teeth WITH YOUR REGULAR TOOTHPASTE.   Please read over the following fact sheets that you were given.

## 2019-01-25 ENCOUNTER — Other Ambulatory Visit (HOSPITAL_COMMUNITY)
Admission: RE | Admit: 2019-01-25 | Discharge: 2019-01-25 | Disposition: A | Discharge status: Home / Self Care | Payer: BC Managed Care – PPO | Source: Ambulatory Visit | Attending: Neurosurgery | Admitting: Neurosurgery

## 2019-01-25 ENCOUNTER — Other Ambulatory Visit: Payer: Self-pay

## 2019-01-25 ENCOUNTER — Encounter (HOSPITAL_COMMUNITY): Payer: Self-pay

## 2019-01-25 ENCOUNTER — Encounter (HOSPITAL_COMMUNITY)
Admission: RE | Admit: 2019-01-25 | Discharge: 2019-01-25 | Disposition: A | Discharge status: Home / Self Care | Payer: BC Managed Care – PPO | Source: Ambulatory Visit | Attending: Neurosurgery | Admitting: Neurosurgery

## 2019-01-25 DIAGNOSIS — Z1159 Encounter for screening for other viral diseases: Secondary | ICD-10-CM | POA: Diagnosis not present

## 2019-01-25 DIAGNOSIS — Z01812 Encounter for preprocedural laboratory examination: Secondary | ICD-10-CM | POA: Diagnosis not present

## 2019-01-25 HISTORY — DX: Other complications of anesthesia, initial encounter: T88.59XA

## 2019-01-25 LAB — CBC
HCT: 52.5 % — ABNORMAL HIGH (ref 39.0–52.0)
Hemoglobin: 17.1 g/dL — ABNORMAL HIGH (ref 13.0–17.0)
MCH: 29.2 pg (ref 26.0–34.0)
MCHC: 32.6 g/dL (ref 30.0–36.0)
MCV: 89.6 fL (ref 80.0–100.0)
Platelets: 179 10*3/uL (ref 150–400)
RBC: 5.86 MIL/uL — ABNORMAL HIGH (ref 4.22–5.81)
RDW: 13.1 % (ref 11.5–15.5)
WBC: 7.8 10*3/uL (ref 4.0–10.5)
nRBC: 0 % (ref 0.0–0.2)

## 2019-01-25 LAB — BASIC METABOLIC PANEL
Anion gap: 9 (ref 5–15)
BUN: 19 mg/dL (ref 8–23)
CO2: 24 mmol/L (ref 22–32)
Calcium: 9.9 mg/dL (ref 8.9–10.3)
Chloride: 109 mmol/L (ref 98–111)
Creatinine, Ser: 1.98 mg/dL — ABNORMAL HIGH (ref 0.61–1.24)
GFR calc Af Amer: 40 mL/min — ABNORMAL LOW (ref >60)
GFR calc non Af Amer: 34 mL/min — ABNORMAL LOW (ref >60)
Glucose, Bld: 97 mg/dL (ref 70–99)
Potassium: 4.6 mmol/L (ref 3.5–5.1)
Sodium: 142 mmol/L (ref 135–145)

## 2019-01-25 LAB — SURGICAL PCR SCREEN
MRSA, PCR: NEGATIVE
Staphylococcus aureus: POSITIVE — AB

## 2019-01-25 LAB — TYPE AND SCREEN
ABO/RH(D): O NEG
Antibody Screen: NEGATIVE

## 2019-01-25 NOTE — Progress Notes (Signed)
PCP - Dr. Windle Guard  Cardiologist - denies   Urology-Dr.Wrenn  Chest x-ray - NA  EKG - 01-25-19 (Epic)  Stress Test - 07-07-07 (Epic)  ECHO - 07-04-07 (epic)  Cardiac Cath - denies  AICD-denies PM-denies LOOP-denies  Sleep Study -N  CPAP - NA  LABS-PCR, T/S, CBC, BMP  ASA- denies  ERAS-NA  HA1C-NA Fasting Blood Sugar -  Checks Blood Sugar __0__ times a day  Anesthesia-Y.  H/o MVP  Pt denies having chest pain, sob, or fever at this time. All instructions explained to the pt, with a verbal understanding of the material. Pt agrees to go over the instructions while at home for a better understanding. The opportunity to ask questions was provided.

## 2019-01-25 NOTE — Progress Notes (Addendum)
  Coronavirus Screening  COVID test to be done today at [facility] Have you experienced the following symptoms:  Cough yes/no: No Fever (>100.5F)  yes/no: No Runny nose yes/no: No Sore throat yes/no: No Difficulty breathing/shortness of breath  yes/no: No  Have you or a family member traveled in the last 14 days and where? yes/no: No  Loss of sense or smell-N   If the patient indicates "YES" to the above questions, their PAT will be rescheduled to limit the exposure to others and, the surgeon will be notified. THE PATIENT WILL NEED TO BE ASYMPTOMATIC FOR 14 DAYS.   If the patient is not experiencing any of these symptoms, the PAT nurse will instruct them to NOT bring anyone with them to their appointment since they may have these symptoms or traveled as well.   Please remind your patients and families that hospital visitation restrictions are in effect and the importance of the restrictions.

## 2019-01-25 NOTE — Progress Notes (Signed)
Left message with surgeon's office about Serum Creatinine=1.98.  Revonda Standard ,[place] also made aware.

## 2019-01-25 NOTE — Progress Notes (Signed)
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 Instructed patient to [facility] [phone].

## 2019-01-26 ENCOUNTER — Encounter (HOSPITAL_COMMUNITY): Payer: Self-pay

## 2019-01-26 LAB — NOVEL CORONAVIRUS, NAA (HOSP ORDER, SEND-OUT TO REF LAB; TAT 18-24 HRS): SARS-CoV-2, NAA: NOT DETECTED

## 2019-01-26 NOTE — Anesthesia Preprocedure Evaluation (Addendum)
Anesthesia Evaluation  Patient identified by MRN, date of birth, ID band Patient awake    Reviewed: Allergy & Precautions, NPO status , Patient's Chart, lab work & pertinent test results  Airway Mallampati: II  TM Distance: >3 FB Neck ROM: Full    Dental no notable dental hx. (+) Poor Dentition   Pulmonary neg pulmonary ROS, former smoker,    Pulmonary exam normal breath sounds clear to auscultation       Cardiovascular Exercise Tolerance: Good Normal cardiovascular exam Rhythm:Regular Rate:Normal     Neuro/Psych negative neurological ROS     GI/Hepatic Neg liver ROS, hiatal hernia, GERD  ,  Endo/Other    Renal/GU CRFRenal diseaseCr 1.98 K+ 4.6     Musculoskeletal   Abdominal   Peds  Hematology negative hematology ROS (+)   Anesthesia Other Findings   Reproductive/Obstetrics                           Anesthesia Physical Anesthesia Plan  ASA: III  Anesthesia Plan: General   Post-op Pain Management:    Induction: Intravenous  PONV Risk Score and Plan: 3 and Treatment may vary due to age or medical condition, Ondansetron and Dexamethasone  Airway Management Planned: Video Laryngoscope Planned and Oral ETT  Additional Equipment:   Intra-op Plan:   Post-operative Plan: Extubation in OR  Informed Consent: I have reviewed the patients History and Physical, chart, labs and discussed the procedure including the risks, benefits and alternatives for the proposed anesthesia with the patient or authorized representative who has indicated his/her understanding and acceptance.     Dental advisory given  Plan Discussed with: CRNA  Anesthesia Plan Comments: (PAT note written 01/26/2019 by Shonna Chock, PA-C.   GA w glidescope )      Anesthesia Quick Evaluation

## 2019-01-26 NOTE — Progress Notes (Signed)
Anesthesia Chart Review:  Case:  [id number] Date/Time:  01/29/19 1248   Procedure:  Lumbar 2-3 Posterior lumbar interbody fusion/explore fusion (N/A ) - Lumbar 2-3 Posterior lumbar interbody fusion/explore fusion   Anesthesia type:  General   Pre-op diagnosis:  Lumbar adjacent segment disease with spondylolisthesis   Location:  [facility] OR ROOM 18 / [place] [facility]   Surgeon:  Newman Pies, MD      DISCUSSION: Patient is a 68 year old male scheduled for the above procedure.  History includes former smoker (quit 2001), GERD, MVP, renal insufficiency/CKD stage III, nephrolithiasis, chronic back pain, ACDF (C5-6/6-7 06/05/02; C6-T1 03/29/06), L3S1 posterolateral fusion 09/09/10. Reported tremors after 2016 cholecystectomy.   Preoperative BUN 19, Cr 1.98, eGFR 34 consistent with history of CKD stage III, although Cr in 10/2017 was primarily ~ 1.5 by [facility] labs which were done during 10/2017 hospitalization/ED visit for rectal bleeding, acute prostatitis with urinary retention requiring indwelling foley catheter. Patient reports bleeding has resolved (colonosocpy/EGD < 5 years ago). He also denies any acute urinary issues. His nephrolithiasis/CKD is followed by urologist Dr. Jeffie Pollock whom he sees about every six months with UA, labs. He was reportedly seen 1-2 months ago and was told everything was stable. Patient has been on Naproxen 2 tabs BID for about a year, but these have been on hold for about 2 weeks now. We discussed that NSAIDS can worsen CKD.   He denied chest pain, SOB, edema. Despite back and leg pain/right foot numbness, he continues to work. He drives a dump truck, but work also involves shoveling, Librarian, academic for paving, Social research officer, government. Sleeping poorly due to pain and looking forward to surgery.   Discussed renal function with anesthesiologist Wallis Bamberg, MD. Patient with known CKD stage III followed by urology. He denied any acute symptoms. No edema or hyperkalemia. If no acute changes then it is anticipated  that he can proceed proceed as planned.      VS: BP (!) 128/94   Pulse 97   Temp (!) 36.3 C   Resp 20   Ht 5' 10.5" (1.791 m)   Wt 84.7 kg   SpO2 99%   BMI 26.41 kg/m   PROVIDERS: Leonard Downing, MD is PCP Irine Seal, MD is urologist   LABS: Preoperative labs noted. Cr 1.98. Since 2010 Cr 1.43-2.2, primarily being in the ~ 1.5 range. (all labs ordered are listed, but only abnormal results are displayed)  Labs Reviewed  SURGICAL PCR SCREEN - Abnormal; Notable for the following components:      Result Value   Staphylococcus aureus POSITIVE (*)    All other components within normal limits  BASIC METABOLIC PANEL - Abnormal; Notable for the following components:   Creatinine, Ser 1.98 (*)    GFR calc non Af Amer 34 (*)    GFR calc Af Amer 40 (*)    All other components within normal limits  CBC - Abnormal; Notable for the following components:   RBC 5.86 (*)    Hemoglobin 17.1 (*)    HCT 52.5 (*)    All other components within normal limits  TYPE AND SCREEN    IMAGING: CTA abd/pelvis 11/10/17: IMPRESSION: VASCULAR 1. No bowel extravasation to localize site of GI bleed. 2. 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 NON-VASCULAR 1. Colonic diverticulosis without diverticulitis. No obvious colonic or bowel mass allowing for lack of enteric contrast. Normal appendix. 2. Urinary bladder decompressed by Foley catheter, there is bladder wall thickening and perivesicular edema, concerning for cystitis.  3. Bilateral hydronephrosis, which is chronic. Nonobstructing left renal stone. 4. Moderate hiatal hernia. 5. Prominent prostate gland.   EKG: 01/25/19: NSR   CV: Nuclear stress test 07/07/07: IMPRESSION: Normal myocardial perfusion study with ejection fraction calculated at 65%.  Echo 07/04/07: SUMMARY - Overall left ventricular systolic function was normal. Left    ventricular ejection fraction was estimated to be 60 %. There     were no left ventricular regional wall motion abnormalities.    There was mild focal basal septal hypertrophy. Features were    consistent with a pseudonormal left ventricular filling    pattern, with concomitant abnormal relaxation and increased    filling pressure. - There was a mild mitral valve prolapse involving the anterior and    posterior leaflets. - Left atrial size was at the upper limits of normal. - There was a trivial pericardial effusion posterior to the heart.   Past Medical History:  Diagnosis Date  . Arthritis    hands  . Bilateral ureteral calculi   . Chronic back pain   . DDD (degenerative disc disease), lumbosacral   . GERD (gastroesophageal reflux disease)   . H/O mitral valve prolapse 2008 per echo   asymptomatic and does not see cardiologist.   . Hematuria   . History of kidney stones   . 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 Renal calculi    left  . Renal insufficiency     Past Surgical History:  Procedure Laterality Date  . ANTERIOR CERVICAL DECOMP/DISCECTOMY FUSION  C5--C7  06-05-2002//  C7--T1   03-29-2006  . BICEPS TENDON REPAIR Right 2004 approx  . COLONOSCOPY WITH PROPOFOL N/A 06/27/2015   Procedure: COLONOSCOPY WITH PROPOFOL;  Surgeon: Lucilla Lame, MD;  Location: [place];  Service: Endoscopy;  Laterality: N/A;  . CYSTO/  RIGHT RETROGRADE PYELOGRAM/ URETEROSCOPY ATTEMPTED STONE MANIPULATION/  RIGHT STENT PLACEMENT  03-03-2009  . CYSTOSCOPY WITH RETROGRADE PYELOGRAM, URETEROSCOPY AND STENT PLACEMENT Right 01/24/2014   Procedure: CYSTOSCOPY WITH RETROGRADE PYELOGRAM, URETEROSCOPY AND STENT PLACEMENT, holmium laser, stone extraction;  Surgeon: Arvil Persons, MD;  Location: [facility];  Service: Urology;  Laterality: Right;  . CYSTOSCOPY WITH RETROGRADE PYELOGRAM, URETEROSCOPY AND STENT PLACEMENT Right 02/07/2014   Procedure: RIGHT URETEROSCOPY, STONE  EXTRACTION AND POSSIBLE STENT PLACEMENT;  Surgeon: Malka So, MD;  Location: [facility];  Service: Urology;  Laterality: Right;  . CYSTOSCOPY WITH RETROGRADE PYELOGRAM, URETEROSCOPY AND STENT PLACEMENT Bilateral 02/04/2015   Procedure: CYSTOSCOPY WITH RETROGRADE PYELOGRAM, URETEROSCOPY AND STENT PLACEMENT, BILATERAL;  Surgeon: Carolan Clines, MD;  Location: [facility];  Service: Urology;  Laterality: Bilateral;  . CYSTOSCOPY WITH URETEROSCOPY, STONE BASKETRY AND STENT PLACEMENT Bilateral 02/20/2015   Procedure: CYSTOSCOPY WITH BILATERAL URETEROSCOPY, STONE EXTRACTION WITH LASER  AND STENT PLACEMENT;  Surgeon: Irine Seal, MD;  Location[facility];  Service: Urology;  Laterality: Bilateral;  . ESOPHAGOGASTRODUODENOSCOPY (EGD) WITH PROPOFOL N/A 10/17/2015   Procedure: ESOPHAGOGASTRODUODENOSCOPY (EGD) WITH PROPOFOL;  Surgeon: Lucilla Lame, MD;  Location: [place];  Service: Endoscopy;  Laterality: N/A;  . EXTRACORPOREAL SHOCK WAVE LITHOTRIPSY  Left 02-13-2015//  Right 01-17-2014  . HOLMIUM LASER APPLICATION Right 2/82/0601   Procedure: HOLMIUM LASER APPLICATION;  Surgeon: Malka So, MD;  Location: [facility];  Service: Urology;  Laterality: Right;  . HOLMIUM LASER APPLICATION N/A 5/61/5379   Procedure: HOLMIUM LASER APPLICATION;  Surgeon: Irine Seal, MD;  Location: [place];  Service: Urology;  Laterality: N/A;  . LAPAROSCOPIC CHOLECYSTECTOMY SINGLE PORT N/A 04/19/2015   Procedure: LAPAROSCOPIC CHOLECYSTECTOMY;  Surgeon: Florene Glen, MD;  Location: [facility];  Service: General;  Laterality: N/A;  . LEFT URETEROSCOPIC LASER LITHOTRIPSY STONE EXTRACTION W/ STENT PLACEMENT  03-09-2011;   09-18-2009  . LEFT URETEROSCOPIC STONE EXTRACTION W/ STENT  09-25-2009  . LUMBAR LAMINECTOMY  x2  ----  L3 - L5//  L5 -- S1  . POSTERIOR FUSION LUMBAR SPINE  09-08-2010   Re-do Laminectomy L3-S1 w/  Decompression and fusion   . RIGHT  URETEROSCOPIC STONE EXTRACTION  03-13-2009  . TRANSTHORACIC ECHOCARDIOGRAM  07-04-2007   Mild focal basal septal hypertrophy/  ef 60%/  mild MVP involving anterior and posterior leaflets w/ trivial MR/  trivial pericardial effusion posterior to the heart    MEDICATIONS: . levofloxacin (LEVAQUIN) 500 MG tablet  . naproxen sodium (ALEVE) 220 MG tablet  . pantoprazole (PROTONIX) 40 MG tablet  . potassium citrate (UROCIT-K) 10 MEQ (1080 MG) SR tablet  . tamsulosin (FLOMAX) 0.4 MG CAPS capsule   No current facility-administered medications for this encounter.     Myra Gianotti, PA-C Surgical Short Stay/Anesthesiology [facility] Phone [phone] [facility] Phone [phone] 01/26/2019 1:40 PM

## 2019-01-28 ENCOUNTER — Other Ambulatory Visit: Payer: Self-pay | Admitting: Gastroenterology

## 2019-01-29 ENCOUNTER — Inpatient Hospital Stay (HOSPITAL_COMMUNITY): Payer: BC Managed Care – PPO | Admitting: Vascular Surgery

## 2019-01-29 ENCOUNTER — Encounter (HOSPITAL_COMMUNITY)
Admission: RE | Disposition: A | Discharge status: Home Health | Payer: Self-pay | Source: Home / Self Care | Attending: Neurosurgery

## 2019-01-29 ENCOUNTER — Inpatient Hospital Stay (HOSPITAL_COMMUNITY): Payer: BC Managed Care – PPO | Admitting: Anesthesiology

## 2019-01-29 ENCOUNTER — Other Ambulatory Visit: Payer: Self-pay

## 2019-01-29 ENCOUNTER — Inpatient Hospital Stay (HOSPITAL_COMMUNITY)
Admission: RE | Admit: 2019-01-29 | Discharge: 2019-02-02 | DRG: 455 | Disposition: A | Discharge status: Home Health | Payer: BC Managed Care – PPO | Attending: Neurosurgery | Admitting: Neurosurgery

## 2019-01-29 ENCOUNTER — Inpatient Hospital Stay (HOSPITAL_COMMUNITY): Payer: BC Managed Care – PPO

## 2019-01-29 ENCOUNTER — Encounter (HOSPITAL_COMMUNITY): Payer: Self-pay | Admitting: Certified Registered Nurse Anesthetist

## 2019-01-29 DIAGNOSIS — M5136 Other intervertebral disc degeneration, lumbar region: Secondary | ICD-10-CM | POA: Diagnosis not present

## 2019-01-29 DIAGNOSIS — Z800 Family history of malignant neoplasm of digestive organs: Secondary | ICD-10-CM

## 2019-01-29 DIAGNOSIS — Z87442 Personal history of urinary calculi: Secondary | ICD-10-CM

## 2019-01-29 DIAGNOSIS — Z885 Allergy status to narcotic agent status: Secondary | ICD-10-CM

## 2019-01-29 DIAGNOSIS — M48062 Spinal stenosis, lumbar region with neurogenic claudication: Secondary | ICD-10-CM | POA: Diagnosis present

## 2019-01-29 DIAGNOSIS — E739 Lactose intolerance, unspecified: Secondary | ICD-10-CM | POA: Diagnosis present

## 2019-01-29 DIAGNOSIS — K449 Diaphragmatic hernia without obstruction or gangrene: Secondary | ICD-10-CM | POA: Diagnosis present

## 2019-01-29 DIAGNOSIS — K219 Gastro-esophageal reflux disease without esophagitis: Secondary | ICD-10-CM | POA: Diagnosis present

## 2019-01-29 DIAGNOSIS — Z981 Arthrodesis status: Secondary | ICD-10-CM

## 2019-01-29 DIAGNOSIS — G8929 Other chronic pain: Secondary | ICD-10-CM | POA: Diagnosis present

## 2019-01-29 DIAGNOSIS — M6281 Muscle weakness (generalized): Secondary | ICD-10-CM | POA: Diagnosis not present

## 2019-01-29 DIAGNOSIS — Z823 Family history of stroke: Secondary | ICD-10-CM | POA: Diagnosis not present

## 2019-01-29 DIAGNOSIS — N400 Enlarged prostate without lower urinary tract symptoms: Secondary | ICD-10-CM | POA: Diagnosis not present

## 2019-01-29 DIAGNOSIS — M5416 Radiculopathy, lumbar region: Secondary | ICD-10-CM | POA: Diagnosis not present

## 2019-01-29 DIAGNOSIS — Z833 Family history of diabetes mellitus: Secondary | ICD-10-CM | POA: Diagnosis not present

## 2019-01-29 DIAGNOSIS — M5116 Intervertebral disc disorders with radiculopathy, lumbar region: Principal | ICD-10-CM | POA: Diagnosis present

## 2019-01-29 DIAGNOSIS — Z841 Family history of disorders of kidney and ureter: Secondary | ICD-10-CM | POA: Diagnosis not present

## 2019-01-29 DIAGNOSIS — J9811 Atelectasis: Secondary | ICD-10-CM | POA: Diagnosis not present

## 2019-01-29 DIAGNOSIS — Z791 Long term (current) use of non-steroidal anti-inflammatories (NSAID): Secondary | ICD-10-CM

## 2019-01-29 DIAGNOSIS — Z91018 Allergy to other foods: Secondary | ICD-10-CM

## 2019-01-29 DIAGNOSIS — Z79899 Other long term (current) drug therapy: Secondary | ICD-10-CM | POA: Diagnosis not present

## 2019-01-29 DIAGNOSIS — N183 Chronic kidney disease, stage 3 (moderate): Secondary | ICD-10-CM | POA: Diagnosis not present

## 2019-01-29 DIAGNOSIS — Z9049 Acquired absence of other specified parts of digestive tract: Secondary | ICD-10-CM | POA: Diagnosis not present

## 2019-01-29 DIAGNOSIS — M4316 Spondylolisthesis, lumbar region: Secondary | ICD-10-CM | POA: Diagnosis not present

## 2019-01-29 DIAGNOSIS — R509 Fever, unspecified: Secondary | ICD-10-CM

## 2019-01-29 DIAGNOSIS — Z8249 Family history of ischemic heart disease and other diseases of the circulatory system: Secondary | ICD-10-CM | POA: Diagnosis not present

## 2019-01-29 DIAGNOSIS — Z87891 Personal history of nicotine dependence: Secondary | ICD-10-CM | POA: Diagnosis not present

## 2019-01-29 DIAGNOSIS — M4326 Fusion of spine, lumbar region: Secondary | ICD-10-CM | POA: Diagnosis not present

## 2019-01-29 DIAGNOSIS — Z419 Encounter for procedure for purposes other than remedying health state, unspecified: Secondary | ICD-10-CM

## 2019-01-29 SURGERY — POSTERIOR LUMBAR FUSION 1 LEVEL
Anesthesia: General

## 2019-01-29 MED ORDER — MEPERIDINE HCL 25 MG/ML IJ SOLN: 6.2500 mg | INTRAMUSCULAR | Status: DC | PRN

## 2019-01-29 MED ORDER — SODIUM CHLORIDE 0.9% FLUSH
3.0000 mL | Freq: Two times a day (BID) | INTRAVENOUS | Status: DC
  Administered 2019-01-29 – 2019-02-02 (×8): 3 mL via INTRAVENOUS

## 2019-01-29 MED ORDER — EPHEDRINE SULFATE-NACL 50-0.9 MG/10ML-% IV SOSY
PREFILLED_SYRINGE | INTRAVENOUS | Status: DC | PRN
  Administered 2019-01-29: 15 mg via INTRAVENOUS

## 2019-01-29 MED ORDER — BUPIVACAINE-EPINEPHRINE (PF) 0.5% -1:200000 IJ SOLN
INTRAMUSCULAR | Status: DC | PRN
  Administered 2019-01-29: 10 mL via PERINEURAL

## 2019-01-29 MED ORDER — BUPIVACAINE LIPOSOME 1.3 % IJ SUSP
20.0000 mL | INTRAMUSCULAR | Status: AC
  Administered 2019-01-29: 20 mL
  Filled 2019-01-29: qty 20

## 2019-01-29 MED ORDER — SUCCINYLCHOLINE CHLORIDE 200 MG/10ML IV SOSY
PREFILLED_SYRINGE | INTRAVENOUS | Status: DC | PRN
  Administered 2019-01-29: 80 mg via INTRAVENOUS

## 2019-01-29 MED ORDER — CHLORHEXIDINE GLUCONATE CLOTH 2 % EX PADS: 6.0000 | MEDICATED_PAD | Freq: Once | CUTANEOUS | Status: DC

## 2019-01-29 MED ORDER — SODIUM CHLORIDE 0.9 % IV SOLN
INTRAVENOUS | Status: DC | PRN
  Administered 2019-01-29: 15:00:00

## 2019-01-29 MED ORDER — OXYCODONE HCL 5 MG PO TABS
10.0000 mg | ORAL_TABLET | ORAL | Status: DC | PRN
  Administered 2019-01-29 – 2019-02-02 (×4): 10 mg via ORAL
  Filled 2019-01-29 (×3): qty 2

## 2019-01-29 MED ORDER — ONDANSETRON HCL 4 MG/2ML IJ SOLN
INTRAMUSCULAR | Status: AC
  Filled 2019-01-29: qty 2

## 2019-01-29 MED ORDER — HEMOSTATIC AGENTS (NO CHARGE) OPTIME
TOPICAL | Status: DC | PRN
  Administered 2019-01-29: 1 via TOPICAL

## 2019-01-29 MED ORDER — ROCURONIUM BROMIDE 50 MG/5ML IV SOSY
PREFILLED_SYRINGE | INTRAVENOUS | Status: DC | PRN
  Administered 2019-01-29: 20 mg via INTRAVENOUS
  Administered 2019-01-29: 40 mg via INTRAVENOUS
  Administered 2019-01-29 (×2): 20 mg via INTRAVENOUS

## 2019-01-29 MED ORDER — CEFAZOLIN SODIUM-DEXTROSE 2-4 GM/100ML-% IV SOLN
2.0000 g | INTRAVENOUS | Status: AC
  Administered 2019-01-29: 2 g via INTRAVENOUS

## 2019-01-29 MED ORDER — OXYCODONE HCL 5 MG PO TABS
ORAL_TABLET | ORAL | Status: AC
  Filled 2019-01-29: qty 2

## 2019-01-29 MED ORDER — THROMBIN 20000 UNITS EX SOLR
CUTANEOUS | Status: DC | PRN
  Administered 2019-01-29: 16:00:00 via TOPICAL

## 2019-01-29 MED ORDER — PHENYLEPHRINE 40 MCG/ML (10ML) SYRINGE FOR IV PUSH (FOR BLOOD PRESSURE SUPPORT)
PREFILLED_SYRINGE | INTRAVENOUS | Status: DC | PRN
  Administered 2019-01-29: 40 ug via INTRAVENOUS
  Administered 2019-01-29 (×2): 80 ug via INTRAVENOUS

## 2019-01-29 MED ORDER — CEFAZOLIN SODIUM-DEXTROSE 2-4 GM/100ML-% IV SOLN
INTRAVENOUS | Status: AC
  Filled 2019-01-29: qty 100

## 2019-01-29 MED ORDER — CYCLOBENZAPRINE HCL 10 MG PO TABS
ORAL_TABLET | ORAL | Status: AC
  Filled 2019-01-29: qty 1

## 2019-01-29 MED ORDER — HYDROMORPHONE HCL 1 MG/ML IJ SOLN
0.2500 mg | INTRAMUSCULAR | Status: DC | PRN
  Administered 2019-01-29: 0.5 mg via INTRAVENOUS

## 2019-01-29 MED ORDER — BUPIVACAINE-EPINEPHRINE (PF) 0.5% -1:200000 IJ SOLN
INTRAMUSCULAR | Status: AC
  Filled 2019-01-29: qty 30

## 2019-01-29 MED ORDER — DOCUSATE SODIUM 100 MG PO CAPS
100.0000 mg | ORAL_CAPSULE | Freq: Two times a day (BID) | ORAL | Status: DC
  Administered 2019-01-29 – 2019-02-02 (×8): 100 mg via ORAL
  Filled 2019-01-29 (×8): qty 1

## 2019-01-29 MED ORDER — MENTHOL 3 MG MT LOZG: 1.0000 | LOZENGE | OROMUCOSAL | Status: DC | PRN

## 2019-01-29 MED ORDER — ACETAMINOPHEN 650 MG RE SUPP: 650.0000 mg | RECTAL | Status: DC | PRN

## 2019-01-29 MED ORDER — SODIUM CHLORIDE 0.9 % IV SOLN
INTRAVENOUS | Status: DC
  Administered 2019-01-29 (×2): via INTRAVENOUS

## 2019-01-29 MED ORDER — HYDROMORPHONE HCL 1 MG/ML IJ SOLN
0.2500 mg | INTRAMUSCULAR | Status: DC | PRN
  Administered 2019-01-29 (×3): 0.5 mg via INTRAVENOUS

## 2019-01-29 MED ORDER — PHENYLEPHRINE 40 MCG/ML (10ML) SYRINGE FOR IV PUSH (FOR BLOOD PRESSURE SUPPORT)
PREFILLED_SYRINGE | INTRAVENOUS | Status: AC
  Filled 2019-01-29: qty 10

## 2019-01-29 MED ORDER — ONDANSETRON HCL 4 MG PO TABS: 4.0000 mg | ORAL_TABLET | Freq: Four times a day (QID) | ORAL | Status: DC | PRN

## 2019-01-29 MED ORDER — CEFAZOLIN SODIUM-DEXTROSE 2-4 GM/100ML-% IV SOLN
2.0000 g | Freq: Three times a day (TID) | INTRAVENOUS | Status: AC
  Administered 2019-01-29 – 2019-01-30 (×2): 2 g via INTRAVENOUS
  Filled 2019-01-29 (×2): qty 100

## 2019-01-29 MED ORDER — PANTOPRAZOLE SODIUM 40 MG PO TBEC
40.0000 mg | DELAYED_RELEASE_TABLET | Freq: Every day | ORAL | Status: DC
  Administered 2019-01-30 – 2019-02-02 (×4): 40 mg via ORAL
  Filled 2019-01-29 (×4): qty 1

## 2019-01-29 MED ORDER — FENTANYL CITRATE (PF) 100 MCG/2ML IJ SOLN
INTRAMUSCULAR | Status: DC | PRN
  Administered 2019-01-29 (×8): 50 ug via INTRAVENOUS
  Administered 2019-01-29: 100 ug via INTRAVENOUS

## 2019-01-29 MED ORDER — PROMETHAZINE HCL 25 MG/ML IJ SOLN: 6.2500 mg | INTRAMUSCULAR | Status: DC | PRN

## 2019-01-29 MED ORDER — ONDANSETRON HCL 4 MG/2ML IJ SOLN
INTRAMUSCULAR | Status: DC | PRN
  Administered 2019-01-29 (×2): 4 mg via INTRAVENOUS

## 2019-01-29 MED ORDER — HYDROMORPHONE HCL 1 MG/ML IJ SOLN
INTRAMUSCULAR | Status: AC
  Administered 2019-01-29: 0.5 mg via INTRAVENOUS
  Filled 2019-01-29: qty 1

## 2019-01-29 MED ORDER — ONDANSETRON HCL 4 MG/2ML IJ SOLN
4.0000 mg | Freq: Four times a day (QID) | INTRAMUSCULAR | Status: DC | PRN
  Administered 2019-01-29 – 2019-02-01 (×5): 4 mg via INTRAVENOUS
  Filled 2019-01-29 (×4): qty 2

## 2019-01-29 MED ORDER — THROMBIN 5000 UNITS EX SOLR
CUTANEOUS | Status: AC
  Filled 2019-01-29: qty 5000

## 2019-01-29 MED ORDER — DEXAMETHASONE SODIUM PHOSPHATE 10 MG/ML IJ SOLN
INTRAMUSCULAR | Status: DC | PRN
  Administered 2019-01-29: 5 mg via INTRAVENOUS
  Administered 2019-01-29: 10 mg via INTRAVENOUS

## 2019-01-29 MED ORDER — THROMBIN 20000 UNITS EX SOLR
CUTANEOUS | Status: AC
  Filled 2019-01-29: qty 20000

## 2019-01-29 MED ORDER — HYDROMORPHONE HCL 1 MG/ML IJ SOLN
1.0000 mg | INTRAMUSCULAR | Status: DC | PRN
  Administered 2019-01-30 – 2019-02-01 (×8): 1 mg via INTRAVENOUS
  Filled 2019-01-29 (×8): qty 1

## 2019-01-29 MED ORDER — ACETAMINOPHEN 325 MG PO TABS
650.0000 mg | ORAL_TABLET | ORAL | Status: DC | PRN
  Administered 2019-01-31 – 2019-02-01 (×2): 650 mg via ORAL
  Filled 2019-01-29 (×2): qty 2

## 2019-01-29 MED ORDER — POTASSIUM CITRATE ER 10 MEQ (1080 MG) PO TBCR
10.0000 meq | EXTENDED_RELEASE_TABLET | Freq: Three times a day (TID) | ORAL | Status: DC
  Administered 2019-01-29 – 2019-02-02 (×11): 10 meq via ORAL
  Filled 2019-01-29 (×13): qty 1

## 2019-01-29 MED ORDER — PROPOFOL 10 MG/ML IV BOLUS
INTRAVENOUS | Status: DC | PRN
  Administered 2019-01-29: 150 mg via INTRAVENOUS

## 2019-01-29 MED ORDER — FENTANYL CITRATE (PF) 250 MCG/5ML IJ SOLN
INTRAMUSCULAR | Status: AC
  Filled 2019-01-29: qty 5

## 2019-01-29 MED ORDER — PHENOL 1.4 % MT LIQD: 1.0000 | OROMUCOSAL | Status: DC | PRN

## 2019-01-29 MED ORDER — 0.9 % SODIUM CHLORIDE (POUR BTL) OPTIME
TOPICAL | Status: DC | PRN
  Administered 2019-01-29: 1000 mL

## 2019-01-29 MED ORDER — TAMSULOSIN HCL 0.4 MG PO CAPS
0.4000 mg | ORAL_CAPSULE | Freq: Every day | ORAL | Status: DC
  Administered 2019-01-29 – 2019-02-02 (×5): 0.4 mg via ORAL
  Filled 2019-01-29 (×5): qty 1

## 2019-01-29 MED ORDER — OXYCODONE HCL 5 MG PO TABS
5.0000 mg | ORAL_TABLET | ORAL | Status: DC | PRN
  Administered 2019-01-30 – 2019-02-01 (×4): 5 mg via ORAL
  Filled 2019-01-29 (×4): qty 1

## 2019-01-29 MED ORDER — BACITRACIN ZINC 500 UNIT/GM EX OINT
TOPICAL_OINTMENT | CUTANEOUS | Status: AC
  Filled 2019-01-29: qty 28.35

## 2019-01-29 MED ORDER — LIDOCAINE 2% (20 MG/ML) 5 ML SYRINGE
INTRAMUSCULAR | Status: DC | PRN
  Administered 2019-01-29: 100 mg via INTRAVENOUS

## 2019-01-29 MED ORDER — SUGAMMADEX SODIUM 200 MG/2ML IV SOLN
INTRAVENOUS | Status: DC | PRN
  Administered 2019-01-29: 400 mg via INTRAVENOUS

## 2019-01-29 MED ORDER — VANCOMYCIN HCL 1 G IV SOLR
INTRAVENOUS | Status: DC | PRN
  Administered 2019-01-29: 1000 mg via TOPICAL

## 2019-01-29 MED ORDER — VANCOMYCIN HCL 1000 MG IV SOLR
INTRAVENOUS | Status: AC
  Filled 2019-01-29: qty 1000

## 2019-01-29 MED ORDER — BACITRACIN ZINC 500 UNIT/GM EX OINT
TOPICAL_OINTMENT | CUTANEOUS | Status: DC | PRN
  Administered 2019-01-29: 1 via TOPICAL

## 2019-01-29 MED ORDER — DEXAMETHASONE SODIUM PHOSPHATE 10 MG/ML IJ SOLN
INTRAMUSCULAR | Status: AC
  Filled 2019-01-29: qty 1

## 2019-01-29 MED ORDER — SODIUM CHLORIDE 0.9 % IV SOLN
INTRAVENOUS | Status: DC | PRN
  Administered 2019-01-29: 30 ug/min via INTRAVENOUS

## 2019-01-29 MED ORDER — ROCURONIUM BROMIDE 10 MG/ML (PF) SYRINGE
PREFILLED_SYRINGE | INTRAVENOUS | Status: AC
  Filled 2019-01-29: qty 10

## 2019-01-29 MED ORDER — THROMBIN 5000 UNITS EX SOLR
OROMUCOSAL | Status: DC | PRN
  Administered 2019-01-29: 15:00:00 via TOPICAL
  Administered 2019-01-29: 5 mL via TOPICAL

## 2019-01-29 MED ORDER — SODIUM CHLORIDE 0.9 % IV SOLN
250.0000 mL | INTRAVENOUS | Status: DC
  Administered 2019-01-29: 250 mL via INTRAVENOUS

## 2019-01-29 MED ORDER — LIDOCAINE 2% (20 MG/ML) 5 ML SYRINGE
INTRAMUSCULAR | Status: AC
  Filled 2019-01-29: qty 5

## 2019-01-29 MED ORDER — MIDAZOLAM HCL 2 MG/2ML IJ SOLN: 0.5000 mg | Freq: Once | INTRAMUSCULAR | Status: DC | PRN

## 2019-01-29 MED ORDER — EPHEDRINE 5 MG/ML INJ
INTRAVENOUS | Status: AC
  Filled 2019-01-29: qty 10

## 2019-01-29 MED ORDER — SODIUM CHLORIDE 0.9% FLUSH: 3.0000 mL | INTRAVENOUS | Status: DC | PRN

## 2019-01-29 MED ORDER — MIDAZOLAM HCL 2 MG/2ML IJ SOLN
INTRAMUSCULAR | Status: DC | PRN
  Administered 2019-01-29: 2 mg via INTRAVENOUS

## 2019-01-29 MED ORDER — BISACODYL 10 MG RE SUPP: 10.0000 mg | Freq: Every day | RECTAL | Status: DC | PRN

## 2019-01-29 MED ORDER — ACETAMINOPHEN 500 MG PO TABS
1000.0000 mg | ORAL_TABLET | Freq: Four times a day (QID) | ORAL | Status: AC
  Administered 2019-01-29 – 2019-01-30 (×4): 1000 mg via ORAL
  Filled 2019-01-29 (×4): qty 2

## 2019-01-29 MED ORDER — MIDAZOLAM HCL 2 MG/2ML IJ SOLN
INTRAMUSCULAR | Status: AC
  Filled 2019-01-29: qty 2

## 2019-01-29 MED ORDER — PROMETHAZINE HCL 25 MG/ML IJ SOLN
12.5000 mg | Freq: Four times a day (QID) | INTRAMUSCULAR | Status: DC | PRN
  Administered 2019-01-29 – 2019-02-01 (×6): 12.5 mg via INTRAVENOUS
  Filled 2019-01-29 (×6): qty 1

## 2019-01-29 MED ORDER — SUCCINYLCHOLINE CHLORIDE 200 MG/10ML IV SOSY
PREFILLED_SYRINGE | INTRAVENOUS | Status: AC
  Filled 2019-01-29: qty 10

## 2019-01-29 MED ORDER — CYCLOBENZAPRINE HCL 10 MG PO TABS
10.0000 mg | ORAL_TABLET | Freq: Three times a day (TID) | ORAL | Status: DC | PRN
  Administered 2019-02-02: 10 mg via ORAL
  Filled 2019-01-29: qty 1

## 2019-01-29 SURGICAL SUPPLY — 73 items
BAG DECANTER FOR FLEXI CONT (MISCELLANEOUS) ×3 IMPLANT
BASKET BONE COLLECTION (BASKET) ×3 IMPLANT
BENZOIN TINCTURE PRP APPL 2/3 (GAUZE/BANDAGES/DRESSINGS) ×3 IMPLANT
BLADE CLIPPER SURG (BLADE) ×3 IMPLANT
BUR MATCHSTICK NEURO 3.0 LAGG (BURR) ×6 IMPLANT
BUR PRECISION FLUTE 6.0 (BURR) ×6 IMPLANT
CANISTER SUCT 3000ML PPV (MISCELLANEOUS) IMPLANT
CARTRIDGE OIL MAESTRO DRILL (MISCELLANEOUS) ×1 IMPLANT
CLOSURE STERI-STRIP 1/2X4 (GAUZE/BANDAGES/DRESSINGS) ×1
CLOSURE WOUND 1/2 X4 (GAUZE/BANDAGES/DRESSINGS) ×1
CLSR STERI-STRIP ANTIMIC 1/2X4 (GAUZE/BANDAGES/DRESSINGS) ×2 IMPLANT
CONNECTOR EXPEDIUM TI 55MM (Connector) ×6 IMPLANT
CONNECTOR Z ROD UNIVERSAL 5.5 (Connector) ×6 IMPLANT
CONT SPEC 4OZ CLIKSEAL STRL BL (MISCELLANEOUS) ×3 IMPLANT
COVER BACK TABLE 60X90IN (DRAPES) ×3 IMPLANT
COVER WAND RF STERILE (DRAPES) IMPLANT
DECANTER SPIKE VIAL GLASS SM (MISCELLANEOUS) ×3 IMPLANT
DIFFUSER DRILL AIR PNEUMATIC (MISCELLANEOUS) ×3 IMPLANT
DRAPE C-ARM 42X72 X-RAY (DRAPES) ×6 IMPLANT
DRAPE HALF SHEET 40X57 (DRAPES) ×3 IMPLANT
DRAPE LAPAROTOMY 100X72X124 (DRAPES) ×3 IMPLANT
DRAPE SURG 17X23 STRL (DRAPES) ×12 IMPLANT
DRSG OPSITE POSTOP 4X8 (GAUZE/BANDAGES/DRESSINGS) ×3 IMPLANT
ELECT BLADE 4.0 EZ CLEAN MEGAD (MISCELLANEOUS) ×3
ELECT REM PT RETURN 9FT ADLT (ELECTROSURGICAL) ×3
ELECTRODE BLDE 4.0 EZ CLN MEGD (MISCELLANEOUS) ×1 IMPLANT
ELECTRODE REM PT RTRN 9FT ADLT (ELECTROSURGICAL) ×1 IMPLANT
EVACUATOR 1/8 PVC DRAIN (DRAIN) IMPLANT
GAUZE 4X4 16PLY RFD (DISPOSABLE) ×3 IMPLANT
GAUZE SPONGE 4X4 12PLY STRL (GAUZE/BANDAGES/DRESSINGS) IMPLANT
GLOVE BIO SURGEON STRL SZ8 (GLOVE) ×6 IMPLANT
GLOVE BIO SURGEON STRL SZ8.5 (GLOVE) ×6 IMPLANT
GLOVE BIOGEL PI IND STRL 7.0 (GLOVE) ×4 IMPLANT
GLOVE BIOGEL PI INDICATOR 7.0 (GLOVE) ×8
GLOVE EXAM NITRILE XL STR (GLOVE) IMPLANT
GOWN STRL REUS W/ TWL LRG LVL3 (GOWN DISPOSABLE) IMPLANT
GOWN STRL REUS W/ TWL XL LVL3 (GOWN DISPOSABLE) ×2 IMPLANT
GOWN STRL REUS W/TWL 2XL LVL3 (GOWN DISPOSABLE) IMPLANT
GOWN STRL REUS W/TWL LRG LVL3 (GOWN DISPOSABLE)
GOWN STRL REUS W/TWL XL LVL3 (GOWN DISPOSABLE) ×4
HEMOSTAT POWDER KIT SURGIFOAM (HEMOSTASIS) ×6 IMPLANT
KIT BASIN OR (CUSTOM PROCEDURE TRAY) ×3 IMPLANT
KIT TURNOVER KIT B (KITS) ×3 IMPLANT
MILL MEDIUM DISP (BLADE) ×3 IMPLANT
NEEDLE HYPO 21X1.5 SAFETY (NEEDLE) ×3 IMPLANT
NEEDLE HYPO 22GX1.5 SAFETY (NEEDLE) ×3 IMPLANT
NS IRRIG 1000ML POUR BTL (IV SOLUTION) ×3 IMPLANT
OIL CARTRIDGE MAESTRO DRILL (MISCELLANEOUS) ×3
PACK LAMINECTOMY NEURO (CUSTOM PROCEDURE TRAY) ×3 IMPLANT
PAD ARMBOARD 7.5X6 YLW CONV (MISCELLANEOUS) ×9 IMPLANT
PATTIES SURGICAL .5 X.5 (GAUZE/BANDAGES/DRESSINGS) ×3 IMPLANT
PATTIES SURGICAL .5 X1 (DISPOSABLE) IMPLANT
PATTIES SURGICAL 1X1 (DISPOSABLE) ×3 IMPLANT
PUTTY DBM 10CC CALC GRAN (Putty) ×3 IMPLANT
SCREW SET SINGLE INNER (Screw) ×6 IMPLANT
SCREW VIPER 7X50MM (Screw) ×6 IMPLANT
SEALANT ADHERUS EXTEND TIP (MISCELLANEOUS) ×3 IMPLANT
SPACER RISE 10X22 8-14MM-10 (Spacer) ×6 IMPLANT
SPONGE LAP 4X18 RFD (DISPOSABLE) ×3 IMPLANT
SPONGE NEURO XRAY DETECT 1X3 (DISPOSABLE) IMPLANT
SPONGE SURGIFOAM ABS GEL 100 (HEMOSTASIS) ×3 IMPLANT
STRIP BIOACTIVE 10CC 25X50X8 (Miscellaneous) ×3 IMPLANT
STRIP CLOSURE SKIN 1/2X4 (GAUZE/BANDAGES/DRESSINGS) ×2 IMPLANT
SUT PROLENE 6 0 BV (SUTURE) ×6 IMPLANT
SUT VIC AB 1 CT1 18XBRD ANBCTR (SUTURE) ×2 IMPLANT
SUT VIC AB 1 CT1 8-18 (SUTURE) ×4
SUT VIC AB 2-0 CP2 18 (SUTURE) ×6 IMPLANT
SYR 20CC LL (SYRINGE) IMPLANT
TAP CANN VIPER2 DL 6.0 (TAP) ×3 IMPLANT
TOWEL GREEN STERILE (TOWEL DISPOSABLE) ×3 IMPLANT
TOWEL GREEN STERILE FF (TOWEL DISPOSABLE) ×3 IMPLANT
TRAY FOLEY MTR SLVR 16FR STAT (SET/KITS/TRAYS/PACK) ×3 IMPLANT
WATER STERILE IRR 1000ML POUR (IV SOLUTION) ×3 IMPLANT

## 2019-01-29 NOTE — Anesthesia Postprocedure Evaluation (Signed)
Anesthesia Post Note  Patient: DARRELD HOFFER  Procedure(s) Performed: Lumbar Two-Three Posterior lumbar interbody fusion (N/A )     Patient location during evaluation: PACU Anesthesia Type: General Level of consciousness: sedated, patient cooperative and oriented Pain management: pain level controlled (improving, pt able to sleep) Vital Signs Assessment: post-procedure vital signs reviewed and stable Respiratory status: spontaneous breathing, nonlabored ventilation, respiratory function stable and patient connected to nasal cannula oxygen Cardiovascular status: blood pressure returned to baseline and stable Postop Assessment: no apparent nausea or vomiting Anesthetic complications: no    Last Vitals:  Vitals:   01/29/19 2020 01/29/19 2030  BP:  (!) 152/83  Pulse: 94 96  Resp: 15 13  Temp:  (!) 36.1 C  SpO2: 94% 100%    Last Pain:  Vitals:   01/29/19 2020  TempSrc:   PainSc: 10-Worst pain ever                 Geanna Divirgilio,E. Narciso Stoutenburg

## 2019-01-29 NOTE — Progress Notes (Signed)
Subjective: The patient is alert and pleasant.  He complains of nausea.  Objective: Vital signs in last 24 hours: Temp:  [97.9 F (36.6 C)] 97.9 F (36.6 C) (06/08 1900) Pulse Rate:  [86-95] 95 (06/08 1900) Resp:  [17-20] 17 (06/08 1900) BP: (147-166)/(87-99) 147/87 (06/08 1900) SpO2:  [98 %] 98 % (06/08 1900) Weight:  [83.9 kg] 83.9 kg (06/08 1014) Estimated body mass index is 26.17 kg/m as calculated from the following:   Height as of this encounter: 5' 10.5" (1.791 m).   Weight as of this encounter: 83.9 kg.   Intake/Output from previous day: No intake/output data recorded. Intake/Output this shift: No intake/output data recorded.  Physical exam the patient is alert and pleasant.  He is moving his lower extremities well.  Lab Results: No results for input(s): WBC, HGB, HCT, PLT in the last 72 hours. BMET No results for input(s): NA, K, CL, CO2, GLUCOSE, BUN, CREATININE, CALCIUM in the last 72 hours.  Studies/Results: No results found.  Assessment/Plan: The patient is doing well.  I spoke with Robin.  We will keep his head less than 30 degrees till tomorrow.  LOS: 0 days     Ophelia Charter 01/29/2019, 7:11 PM

## 2019-01-29 NOTE — Transfer of Care (Signed)
Immediate Anesthesia Transfer of Care Note  Patient: Anthony [name]  Procedure(s) Performed: Lumbar Two-Three Posterior lumbar interbody fusion (N/A )  Patient Location: PACU  Anesthesia Type:General  Level of Consciousness: awake, alert  and oriented  Airway & Oxygen Therapy: Patient Spontanous Breathing and Patient connected to nasal cannula oxygen  Post-op Assessment: Report given to RN and Post -op Vital signs reviewed and stable  Post vital signs: Reviewed and stable  Last Vitals:  Vitals Value Taken Time  BP 147/87 01/29/2019  6:59 PM  Temp    Pulse 92 01/29/2019  7:01 PM  Resp 15 01/29/2019  7:01 PM  SpO2 96 % 01/29/2019  7:01 PM  Vitals shown include unvalidated device data.  Last Pain:  Vitals:   01/29/19 1046  TempSrc:   PainSc: 4          Complications: No apparent anesthesia complications

## 2019-01-29 NOTE — H&P (Signed)
Subjective: The patient is a 68 year old white male whose had a lumbar fusion by another physician years ago.  He has developed recurrent back and leg pain consistent with neurogenic claudication.  He has failed medical management and was worked up with lumbar x-rays and lumbar MRI which demonstrated L2-3 disc degeneration and severe stenosis.  I discussed the various treatment options with him.  He has decided to proceed with surgery.  Past Medical History:  Diagnosis Date  . Arthritis    hands  . Bilateral ureteral calculi   . Chronic back pain   . Complication of anesthesia    self limited tremors after 04/19/15 cholecystectomy  . DDD (degenerative disc disease), lumbosacral   . GERD (gastroesophageal reflux disease)   . H/O mitral valve prolapse 2008 per echo   asymptomatic and does not see cardiologist.   . Hematuria   . History of kidney stones   . 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 Renal calculi    left  . Renal insufficiency     Past Surgical History:  Procedure Laterality Date  . ANTERIOR CERVICAL DECOMP/DISCECTOMY FUSION  C5--C7  06-05-2002//  C7--T1   03-29-2006  . BICEPS TENDON REPAIR Right 2004 approx  . COLONOSCOPY WITH PROPOFOL N/A 06/27/2015   Procedure: COLONOSCOPY WITH PROPOFOL;  Surgeon: Lucilla Lame, MD;  Location: [place];  Service: Endoscopy;  Laterality: N/A;  . CYSTO/  RIGHT RETROGRADE PYELOGRAM/ URETEROSCOPY ATTEMPTED STONE MANIPULATION/  RIGHT STENT PLACEMENT  03-03-2009  . CYSTOSCOPY WITH RETROGRADE PYELOGRAM, URETEROSCOPY AND STENT PLACEMENT Right 01/24/2014   Procedure: CYSTOSCOPY WITH RETROGRADE PYELOGRAM, URETEROSCOPY AND STENT PLACEMENT, holmium laser, stone extraction;  Surgeon: Arvil Persons, MD;  Location: [facility];  Service: Urology;  Laterality: Right;  . CYSTOSCOPY WITH RETROGRADE PYELOGRAM, URETEROSCOPY AND STENT PLACEMENT Right 02/07/2014   Procedure: RIGHT URETEROSCOPY,  STONE EXTRACTION AND POSSIBLE STENT PLACEMENT;  Surgeon: Malka So, MD;  Location: [facility];  Service: Urology;  Laterality: Right;  . CYSTOSCOPY WITH RETROGRADE PYELOGRAM, URETEROSCOPY AND STENT PLACEMENT Bilateral 02/04/2015   Procedure: CYSTOSCOPY WITH RETROGRADE PYELOGRAM, URETEROSCOPY AND STENT PLACEMENT, BILATERAL;  Surgeon: Carolan Clines, MD;  Location: [facility];  Service: Urology;  Laterality: Bilateral;  . CYSTOSCOPY WITH URETEROSCOPY, STONE BASKETRY AND STENT PLACEMENT Bilateral 02/20/2015   Procedure: CYSTOSCOPY WITH BILATERAL URETEROSCOPY, STONE EXTRACTION WITH LASER  AND STENT PLACEMENT;  Surgeon: Irine Seal, MD;  Location: [facility];  Service: Urology;  Laterality: Bilateral;  . ESOPHAGOGASTRODUODENOSCOPY (EGD) WITH PROPOFOL N/A 10/17/2015   Procedure: ESOPHAGOGASTRODUODENOSCOPY (EGD) WITH PROPOFOL;  Surgeon: Lucilla Lame, MD;  Location: [place];  Service: Endoscopy;  Laterality: N/A;  . EXTRACORPOREAL SHOCK WAVE LITHOTRIPSY  Left 02-13-2015//  Right 01-17-2014  . HOLMIUM LASER APPLICATION Right 11/16/7122   Procedure: HOLMIUM LASER APPLICATION;  Surgeon: Malka So, MD;  Location: [facility];  Service: Urology;  Laterality: Right;  . HOLMIUM LASER APPLICATION N/A 5/80/9983   Procedure: HOLMIUM LASER APPLICATION;  Surgeon: Irine Seal, MD;  Location: [facility];  Service: Urology;  Laterality: N/A;  . LAPAROSCOPIC CHOLECYSTECTOMY SINGLE PORT N/A 04/19/2015   Procedure: LAPAROSCOPIC CHOLECYSTECTOMY;  Surgeon: Florene Glen, MD;  Location: [facility];  Service: General;  Laterality: N/A;  . LEFT URETEROSCOPIC LASER LITHOTRIPSY STONE EXTRACTION W/ STENT PLACEMENT  03-09-2011;   09-18-2009  . LEFT URETEROSCOPIC STONE EXTRACTION W/ STENT  09-25-2009  . LUMBAR LAMINECTOMY  x2  ----  L3 - L5//  L5 -- S1  . POSTERIOR FUSION LUMBAR SPINE  09-08-2010   Re-do Laminectomy L3-S1 w/  Decompression and fusion   . RIGHT  URETEROSCOPIC STONE EXTRACTION  03-13-2009  . TRANSTHORACIC ECHOCARDIOGRAM  07-04-2007   Mild focal basal septal hypertrophy/  ef 60%/  mild MVP involving anterior and posterior leaflets w/ trivial MR/  trivial pericardial effusion posterior to the heart    Allergies  Allergen Reactions  . Cheese Other (See Comments)    cramps  . Codeine Itching  . Lactose Intolerance (Gi) Other (See Comments)    GI distress  . Morphine And Related Itching    Social History   Tobacco Use  . Smoking status: Former Smoker    Types: Pipe    Last attempt to quit: 02/17/2000    Years since quitting: 18.9  . Smokeless tobacco: Never Used  Substance Use Topics  . Alcohol use: Yes    Alcohol/week: 1.0 standard drinks    Types: 1 Shots of liquor per week    Comment: occasionally     Family History  Problem Relation Age of Onset  . Cancer Mother   . Diabetes Mother   . Hypertension Mother   . Stroke Mother   . Colon cancer Mother   . Hypertension Brother   . Colon cancer Brother        Pre-cancerous Polyps removed  . Kidney disease Brother        Chronic- secondary to Hypertension  . Stroke Father   . Thyroid disease Daughter   . Colon cancer Maternal Aunt        x 2  . Colon cancer Maternal Uncle    Prior to Admission medications   Medication Sig Start Date End Date Taking? Authorizing Provider  naproxen sodium (ALEVE) 220 MG tablet Take 220 mg by mouth daily as needed (pain).   Yes [provider]  pantoprazole (PROTONIX) 40 MG tablet Take 1 tablet (40 mg total) by mouth daily. **PLEASE SCHEDULE A FOLLOW UP APPT** 11/06/18  Yes Midge MiniumWohl, Darren, MD  potassium citrate (UROCIT-K) 10 MEQ (1080 MG) SR tablet Take 10 mEq by mouth 3 (three) times daily. 09/16/18  Yes [provider]  tamsulosin (FLOMAX) 0.4 MG CAPS capsule Take 1 capsule (0.4 mg total) by mouth daily. 11/08/17  Yes Delfino LovettShah, Vipul, MD  levofloxacin (LEVAQUIN) 500 MG tablet Take 1 tablet (500 mg total) by mouth  daily. Patient not taking: Reported on 12/04/2018 11/07/17   Delfino LovettShah, Vipul, MD     Review of Systems  Positive ROS: As above  All other systems have been reviewed and were otherwise negative with the exception of those mentioned in the HPI and as above.  Objective: Vital signs in last 24 hours: Temp:  [97.9 F (36.6 C)] 97.9 F (36.6 C) (06/08 1014) Pulse Rate:  [86] 86 (06/08 1014) Resp:  [20] 20 (06/08 1014) BP: (166)/(99) 166/99 (06/08 1014) SpO2:  [98 %] 98 % (06/08 1014) Weight:  [83.9 kg] 83.9 kg (06/08 1014) Estimated body mass index is 26.17 kg/m as calculated from the following:   Height as of this encounter: 5' 10.5" (1.791 m).   Weight as of this encounter: 83.9 kg.   General Appearance: Alert Head: Normocephalic, without obvious abnormality, atraumatic Eyes: PERRL, conjunctiva/corneas clear, EOM's intact,    Ears: Normal  Throat: Normal  Neck: Supple, Back: The patient's lumbar incision is well-healed. Lungs: Clear to auscultation bilaterally, respirations unlabored Heart: Regular rate  and rhythm, no murmur, rub or gallop Abdomen: Soft, non-tender Extremities: Extremities normal, atraumatic, no cyanosis or edema Skin: unremarkable  NEUROLOGIC:   Mental status: alert and oriented,Motor Exam - grossly normal Sensory Exam - grossly normal Reflexes:  Coordination - grossly normal Gait - grossly normal Balance - grossly normal Cranial Nerves: I: smell Not tested  II: visual acuity  OS: Normal  OD: Normal   II: visual fields Full to confrontation  II: pupils Equal, round, reactive to light  III,VII: ptosis None  III,IV,VI: extraocular muscles  Full ROM  V: mastication Normal  V: facial light touch sensation  Normal  V,VII: corneal reflex  Present  VII: facial muscle function - upper  Normal  VII: facial muscle function - lower Normal  VIII: hearing Not tested  IX: soft palate elevation  Normal  IX,X: gag reflex Present  XI: trapezius strength  5/5  XI:  sternocleidomastoid strength 5/5  XI: neck flexion strength  5/5  XII: tongue strength  Normal    Data Review Lab Results  Component Value Date   WBC 7.8 01/25/2019   HGB 17.1 (H) 01/25/2019   HCT 52.5 (H) 01/25/2019   MCV 89.6 01/25/2019   PLT 179 01/25/2019   Lab Results  Component Value Date   NA 142 01/25/2019   K 4.6 01/25/2019   CL 109 01/25/2019   CO2 24 01/25/2019   BUN 19 01/25/2019   CREATININE 1.98 (H) 01/25/2019   GLUCOSE 97 01/25/2019   Lab Results  Component Value Date   INR 1.00 09/03/2010    Assessment/Plan: L2-3 degenerative disc disease, spinal stenosis, lumbago, lumbar radiculopathy, neurogenic claudication: I have discussed situation with the patient.  We have discussed the various treatment options including surgery.  We have described the surgical treatment option of an L2-3 decompression, instrumentation and fusion with exploration of his old fusion.  We have shown him surgical models.  We have given him a surgical pamphlet.  We have discussed the risks, benefits, alternatives, expected postop course, and likelihood of achieving our goals with surgery.  We have answered all his questions.  He has decided to proceed with surgery.   Cristi LoronJeffrey D Xavia Kniskern 01/29/2019 1:18 PM

## 2019-01-29 NOTE — Op Note (Addendum)
Brief history: The patient is a 68 year old white male who is had a previous multilevel lumbar fusion by Dr. Phoebe PerchHirsch years ago.  He is developed recurrent back and leg pain consistent with neurogenic claudication.  He has failed medical management and was worked up with lumbar x-rays and lumbar MRI.  These studies demonstrated severe spinal stenosis and degenerative changes at L2-3.  I discussed the various treatment options with the patient including surgery he has decided to proceed with surgery after weighing the risks, benefits and alternatives.  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 Etc. requiring a wide decompression of the nerve roots.);  L2-3 posterior lumbar interbody fusion with local morselized autograft bone and Zimmer DBM; insertion of interbody prosthesis at L2-3 (globus peek expandable interbody prosthesis); posterior segmental instrumentation from L2 to S1 with Depuy titanium pedicle screws and rods; posterior lateral arthrodesis at L2-3 with local morselized autograft bone and Zimmer DBM; exploration of lumbar fusion  Surgeon: Dr. Delma OfficerJeff Tyanne Derocher  Asst.: Hildred PriestMegan Bergman nurse practitioner  Anesthesia: Gen. endotracheal  Estimated blood loss: 400 cc  Drains: None  Complications: Durotomy  Description of procedure: The patient was brought to the operating room by the anesthesia team. General endotracheal anesthesia was induced. The patient was turned to the prone position on the Wilson frame. The patient's lumbosacral region was  then prepared with Betadine scrub and Betadine solution. Sterile drapes were applied.  I then injected the area to be incised with Marcaine with epinephrine solution. I then used the scalpel to make a linear midline incision through the patient's previous surgical scar. I then used electrocautery to perform a bilateral subperiosteal dissection exposing the spinous process and lamina of L2 and L3 and expose the old hardware from L3 to the sacrum.  We then inserted the Verstrac retractor to provide exposure.  I explored the fusion by inspecting the arthrodesis and instrumentation from L3 to the sacrum.  There was a good posterior lateral arthrodesis and no evidence of loosening of the hardware  I began the decompression by using the high speed drill to perform laminotomies at L2-3 bilaterally. We then used the Kerrison punches to widen the laminotomy and removed the ligamentum flavum at L2-3 bilaterally. We used the Kerrison punches to remove the medial facets at L2-3 bilaterally. We performed wide foraminotomies about the bilateral L2 and L3 nerve roots completing the decompression.  We now turned our attention to the posterior lumbar interbody fusion. I used a scalpel to incise the intervertebral disc at L2-3 bilaterally. I then performed a partial intervertebral discectomy at L2-3 bilaterally using the pituitary forceps. We prepared the vertebral endplates at L2-3 bilaterally for the fusion by removing the soft tissues with the curettes. We then used the trial spacers to pick the appropriate sized interbody prosthesis.  In inserting the trial spacer we created a durotomy at L2-3 on the left.  I repaired this with interrupted 6-0 Prolene sutures.  We prefilled the prosthesis with a combination of local morselized autograft bone that we obtained during the decompression as well as Zimmer DBM. We inserted the prefilled prosthesis into the interspace at L2-3 bilaterally we then expanded  the prosthesis. There  was a good snug fit of the prosthesis in the interspace. We then filled and the remainder of the intervertebral disc space with local morselized autograft bone and Zimmer DBM. This completed the posterior lumbar interbody arthrodesis.  We now turned attention to the instrumentation. Under fluoroscopic guidance we cannulated the bilateral L2 pedicles with the bone probe. We then removed the bone probe. We then tapped the pedicle with a 6.0 millimeter tap. We then removed the tap. We probed inside the tapped pedicle with a ball probe to rule out cortical breaches. We then inserted a 7 x 50 millimeter pedicle screw into the L2 pedicles bilaterally under fluoroscopic guidance. We then palpated along the medial aspect of the pedicles to rule out cortical breaches. There were none. The nerve roots were not injured.  Because the old hardware was so overgrown with bone I thought it better to connect the new screws to the old construct using a side connector and rod bilaterally we tightened the nuts appropriately completing the instrumentation from L2 to sacrum  We now turned our attention to the posterior lateral arthrodesis at L2-3 bilaterally. We used the high-speed drill to decorticate the remainder of the facets, pars, transverse process at L2-3 bilaterally. We then applied a combination of local morselized autograft bone and Zimmer DBM over these decorticated posterior lateral structures. This completed the posterior lateral arthrodesis at L2-3.  We then obtained hemostasis using bipolar electrocautery. We irrigated the wound out with bacitracin solution. We inspected the thecal sac and nerve roots and noted they were well decompressed.  We placed Adheris over the exposed dura.  We then removed the retractor. We placed vancomycin powder in the wound.We reapproximated patient's thoracolumbar fascia with interrupted #1 Vicryl suture. We reapproximated patient's subcutaneous tissue with interrupted 2-0 Vicryl  suture. The reapproximated patient's skin with Steri-Strips and benzoin. The wound was then coated with bacitracin ointment. A sterile dressing was applied. The drapes were removed. The patient was subsequently returned to the supine position where they were extubated by the anesthesia team. He was then transported to the post anesthesia care unit in stable condition. All sponge instrument and needle counts were reportedly correct at the end of this case.

## 2019-01-29 NOTE — Anesthesia Procedure Notes (Signed)
Procedure Name: Intubation Date/Time: 01/29/2019 2:04 PM Performed by: Genelle Bal, CRNA Pre-anesthesia Checklist: Patient identified, Emergency Drugs available, Suction available and Patient being monitored Patient Re-evaluated:Patient Re-evaluated prior to induction Oxygen Delivery Method: Circle system utilized Preoxygenation: Pre-oxygenation with 100% oxygen Induction Type: IV induction Laryngoscope Size: Glidescope and 4 Grade View: Grade I Tube type: Oral Tube size: 7.5 mm Number of attempts: 1 Airway Equipment and Method: Stylet and Bite block Placement Confirmation: ETT inserted through vocal cords under direct vision,  positive ETCO2 and breath sounds checked- equal and bilateral Secured at: 22 cm Tube secured with: Tape Dental Injury: Teeth and Oropharynx as per pre-operative assessment  Comments: Elective glidescope intubation d/t hx cervical neck surgery

## 2019-01-30 LAB — CBC
HCT: 44.2 % (ref 39.0–52.0)
Hemoglobin: 14.7 g/dL (ref 13.0–17.0)
MCH: 29.5 pg (ref 26.0–34.0)
MCHC: 33.3 g/dL (ref 30.0–36.0)
MCV: 88.8 fL (ref 80.0–100.0)
Platelets: 159 10*3/uL (ref 150–400)
RBC: 4.98 MIL/uL (ref 4.22–5.81)
RDW: 13 % (ref 11.5–15.5)
WBC: 14.4 10*3/uL — ABNORMAL HIGH (ref 4.0–10.5)
nRBC: 0 % (ref 0.0–0.2)

## 2019-01-30 LAB — BASIC METABOLIC PANEL
Anion gap: 7 (ref 5–15)
BUN: 21 mg/dL (ref 8–23)
CO2: 22 mmol/L (ref 22–32)
Calcium: 8.4 mg/dL — ABNORMAL LOW (ref 8.9–10.3)
Chloride: 108 mmol/L (ref 98–111)
Creatinine, Ser: 1.71 mg/dL — ABNORMAL HIGH (ref 0.61–1.24)
GFR calc Af Amer: 47 mL/min — ABNORMAL LOW (ref >60)
GFR calc non Af Amer: 41 mL/min — ABNORMAL LOW (ref >60)
Glucose, Bld: 132 mg/dL — ABNORMAL HIGH (ref 70–99)
Potassium: 4.4 mmol/L (ref 3.5–5.1)
Sodium: 137 mmol/L (ref 135–145)

## 2019-01-30 MED FILL — Sodium Chloride IV Soln 0.9%: INTRAVENOUS | Qty: 1000 | Status: AC

## 2019-01-30 MED FILL — Thrombin (Recombinant) For Soln 5000 Unit: CUTANEOUS | Qty: 5000 | Status: AC

## 2019-01-30 MED FILL — Gelatin Absorbable MT Powder: OROMUCOSAL | Qty: 1 | Status: AC

## 2019-01-30 MED FILL — Heparin Sodium (Porcine) Inj 1000 Unit/ML: INTRAMUSCULAR | Qty: 30 | Status: AC

## 2019-01-30 NOTE — Progress Notes (Signed)
Pt's last covid screen 01/25/2019 pre-op. Dr. Hilbert Bible informed and inquired for repeat test.

## 2019-01-30 NOTE — Plan of Care (Signed)
Progressing towards goals

## 2019-01-30 NOTE — Progress Notes (Signed)
Subjective: The patient is alert and pleasant.  He has some hiccups.  He denies headaches.  His back is appropriately sore.  Objective: Vital signs in last 24 hours: Temp:  [97 F (36.1 C)-98.2 F (36.8 C)] 98 F (36.7 C) (06/09 0326) Pulse Rate:  [86-99] 91 (06/09 0326) Resp:  [13-27] 18 (06/09 0326) BP: (120-166)/(82-99) 123/84 (06/09 0326) SpO2:  [92 %-100 %] 99 % (06/09 0326) Weight:  [83.9 kg-86.1 kg] 86.1 kg (06/08 2128) Estimated body mass index is 27.24 kg/m as calculated from the following:   Height as of this encounter: 5\' 10"  (1.778 m).   Weight as of this encounter: 86.1 kg.   Intake/Output from previous day: 06/08 0701 - 06/09 0700 In: 2004 [I.V.:1904; IV Piggyback:100] Out: 6063 [Urine:1465; Blood:300] Intake/Output this shift: No intake/output data recorded.  Physical exam the patient is alert and pleasant.  His strength is normal in his lower extremities.  Lab Results: Recent Labs    01/30/19 0358  WBC 14.4*  HGB 14.7  HCT 44.2  PLT 159   BMET Recent Labs    01/30/19 0358  NA 137  K 4.4  CL 108  CO2 22  GLUCOSE 132*  BUN 21  CREATININE 1.71*  CALCIUM 8.4*    Studies/Results: Dg Lumbar Spine 2-3 Views  Result Date: 01/29/2019 CLINICAL DATA:  Surgical posterior fusion of L2-3. EXAM: LUMBAR SPINE - 2-3 VIEW; DG C-ARM 61-120 MIN FLUOROSCOPY TIME:  10 seconds. COMPARISON:  MRI of October 17, 2018. FINDINGS: Two intraoperative fluoroscopic images were obtained of the lumbar spine. These images demonstrate the patient be status post interval surgical posterior fusion of L2-3 with bilateral intrapedicular screw placement and interbody fusion. IMPRESSION: Fluoroscopic guidance was provided during surgical posterior fusion of L2-3. Electronically Signed   By: Marijo Conception M.D.   On: 01/29/2019 19:28   Dg C-arm 1-60 Min  Result Date: 01/29/2019 CLINICAL DATA:  Surgical posterior fusion of L2-3. EXAM: LUMBAR SPINE - 2-3 VIEW; DG C-ARM 61-120 MIN  FLUOROSCOPY TIME:  10 seconds. COMPARISON:  MRI of October 17, 2018. FINDINGS: Two intraoperative fluoroscopic images were obtained of the lumbar spine. These images demonstrate the patient be status post interval surgical posterior fusion of L2-3 with bilateral intrapedicular screw placement and interbody fusion. IMPRESSION: Fluoroscopic guidance was provided during surgical posterior fusion of L2-3. Electronically Signed   By: Marijo Conception M.D.   On: 01/29/2019 19:28    Assessment/Plan: Stop day #1: The patient is doing well.  I will raise his head of bed to 45 degrees.  If he has no headaches we can mobilize him later on today.  LOS: 1 day     Ophelia Charter 01/30/2019, 7:43 AM

## 2019-01-30 NOTE — Plan of Care (Signed)
  Problem: Education: Goal: Knowledge of General Education information will improve Description Including pain rating scale, medication(s)/side effects and non-pharmacologic comfort measures Outcome: Progressing   Problem: Health Behavior/Discharge Planning: Goal: Ability to manage health-related needs will improve Outcome: Progressing   Problem: Clinical Measurements: Goal: Ability to maintain clinical measurements within normal limits will improve Outcome: Progressing Goal: Will remain free from infection Outcome: Progressing Goal: Diagnostic test results will improve Outcome: Progressing Goal: Respiratory complications will improve Outcome: Progressing Goal: Cardiovascular complication will be avoided Outcome: Progressing   Problem: Activity: Goal: Risk for activity intolerance will decrease Outcome: Progressing   Problem: Coping: Goal: Level of anxiety will decrease Outcome: Progressing   Problem: Elimination: Goal: Will not experience complications related to bowel motility Outcome: Progressing Goal: Will not experience complications related to urinary retention Outcome: Progressing   Problem: Safety: Goal: Ability to remain free from injury will improve Outcome: Progressing   Problem: Skin Integrity: Goal: Risk for impaired skin integrity will decrease Outcome: Progressing   Analiese Krupka, BSN, RN 

## 2019-01-30 NOTE — Progress Notes (Signed)
Orthopedic Tech Progress Note Patient Details:  Anthony [name] 01/26/1951 [id number] Called in order to [facility] for [facility] Patient ID: Anthony [name], male   DOB: 1950/11/10, 68 y.o.   MRN: [id number]   Janit Pagan 01/30/2019, 8:37 AM

## 2019-01-31 ENCOUNTER — Inpatient Hospital Stay (HOSPITAL_COMMUNITY): Payer: BC Managed Care – PPO

## 2019-01-31 LAB — CBC
HCT: 43.5 % (ref 39.0–52.0)
Hemoglobin: 14.3 g/dL (ref 13.0–17.0)
MCH: 29.4 pg (ref 26.0–34.0)
MCHC: 32.9 g/dL (ref 30.0–36.0)
MCV: 89.5 fL (ref 80.0–100.0)
Platelets: 137 10*3/uL — ABNORMAL LOW (ref 150–400)
RBC: 4.86 MIL/uL (ref 4.22–5.81)
RDW: 13.1 % (ref 11.5–15.5)
WBC: 11.7 10*3/uL — ABNORMAL HIGH (ref 4.0–10.5)
nRBC: 0 % (ref 0.0–0.2)

## 2019-01-31 NOTE — Plan of Care (Signed)
  Problem: Education: Goal: Knowledge of General Education information will improve Description Including pain rating scale, medication(s)/side effects and non-pharmacologic comfort measures Outcome: Progressing   Problem: Health Behavior/Discharge Planning: Goal: Ability to manage health-related needs will improve Outcome: Progressing   Problem: Clinical Measurements: Goal: Ability to maintain clinical measurements within normal limits will improve Outcome: Progressing Goal: Will remain free from infection Outcome: Progressing Goal: Diagnostic test results will improve Outcome: Progressing Goal: Respiratory complications will improve Outcome: Progressing Goal: Cardiovascular complication will be avoided Outcome: Progressing   Problem: Activity: Goal: Risk for activity intolerance will decrease Outcome: Progressing   Problem: Coping: Goal: Level of anxiety will decrease Outcome: Progressing   Problem: Elimination: Goal: Will not experience complications related to bowel motility Outcome: Progressing Goal: Will not experience complications related to urinary retention Outcome: Progressing   Problem: Safety: Goal: Ability to remain free from injury will improve Outcome: Progressing   Problem: Skin Integrity: Goal: Risk for impaired skin integrity will decrease Outcome: Progressing   Anthony [name], BSN, RN

## 2019-01-31 NOTE — Progress Notes (Signed)
Subjective: The patient is alert and pleasant.  He denies headaches.  He is mobilizing.  Objective: Vital signs in last 24 hours: Temp:  [98 F (36.7 C)-101.4 F (38.6 C)] 101.4 F (38.6 C) (06/10 1248) Pulse Rate:  [82-117] 117 (06/10 1248) Resp:  [16-20] 20 (06/10 1248) BP: (121-146)/(71-87) 146/87 (06/10 1248) SpO2:  [90 %-99 %] 90 % (06/10 1248) Estimated body mass index is 27.24 kg/m as calculated from the following:   Height as of this encounter: 5\' 10"  (1.778 m).   Weight as of this encounter: 86.1 kg.   Intake/Output from previous day: 06/09 0701 - 06/10 0700 In: 340 [P.O.:340] Out: 1905 [Urine:1905] Intake/Output this shift: Total I/O In: -  Out: 100 [Urine:100]  Physical exam patient is alert and oriented.  He is moving his lower extremities well.  His wound is healing well.  Lab Results: Recent Labs    01/30/19 0358  WBC 14.4*  HGB 14.7  HCT 44.2  PLT 159   BMET Recent Labs    01/30/19 0358  NA 137  K 4.4  CL 108  CO2 22  GLUCOSE 132*  BUN 21  CREATININE 1.71*  CALCIUM 8.4*    Studies/Results: Dg Lumbar Spine 2-3 Views  Result Date: 01/29/2019 CLINICAL DATA:  Surgical posterior fusion of L2-3. EXAM: LUMBAR SPINE - 2-3 VIEW; DG C-ARM 61-120 MIN FLUOROSCOPY TIME:  10 seconds. COMPARISON:  MRI of October 17, 2018. FINDINGS: Two intraoperative fluoroscopic images were obtained of the lumbar spine. These images demonstrate the patient be status post interval surgical posterior fusion of L2-3 with bilateral intrapedicular screw placement and interbody fusion. IMPRESSION: Fluoroscopic guidance was provided during surgical posterior fusion of L2-3. Electronically Signed   By: Marijo Conception M.D.   On: 01/29/2019 19:28   Dg C-arm 1-60 Min  Result Date: 01/29/2019 CLINICAL DATA:  Surgical posterior fusion of L2-3. EXAM: LUMBAR SPINE - 2-3 VIEW; DG C-ARM 61-120 MIN FLUOROSCOPY TIME:  10 seconds. COMPARISON:  MRI of October 17, 2018. FINDINGS: Two  intraoperative fluoroscopic images were obtained of the lumbar spine. These images demonstrate the patient be status post interval surgical posterior fusion of L2-3 with bilateral intrapedicular screw placement and interbody fusion. IMPRESSION: Fluoroscopic guidance was provided during surgical posterior fusion of L2-3. Electronically Signed   By: Marijo Conception M.D.   On: 01/29/2019 19:28    Assessment/Plan: Postop day #2: We will mobilize him with PT and OT.  Low-grade fever: We will check cultures, chest x-ray and a CBC.  LOS: 2 days     Anthony [name] 01/31/2019, 2:12 PM

## 2019-01-31 NOTE — Plan of Care (Signed)
Progressing towards goals

## 2019-01-31 NOTE — Progress Notes (Signed)
PA made aware of elevated temperature.

## 2019-01-31 NOTE — Plan of Care (Signed)
  Problem: Education: Goal: Knowledge of General Education information will improve Description Including pain rating scale, medication(s)/side effects and non-pharmacologic comfort measures Outcome: Progressing   Problem: Health Behavior/Discharge Planning: Goal: Ability to manage health-related needs will improve Outcome: Progressing   Problem: Clinical Measurements: Goal: Ability to maintain clinical measurements within normal limits will improve Outcome: Progressing Goal: Will remain free from infection Outcome: Progressing Goal: Diagnostic test results will improve Outcome: Progressing Goal: Respiratory complications will improve Outcome: Progressing Goal: Cardiovascular complication will be avoided Outcome: Progressing   Problem: Activity: Goal: Risk for activity intolerance will decrease Outcome: Progressing   Problem: Coping: Goal: Level of anxiety will decrease Outcome: Progressing   Problem: Elimination: Goal: Will not experience complications related to bowel motility Outcome: Progressing Goal: Will not experience complications related to urinary retention Outcome: Progressing   Problem: Safety: Goal: Ability to remain free from injury will improve Outcome: Progressing   Problem: Skin Integrity: Goal: Risk for impaired skin integrity will decrease Outcome: Progressing   Brystal Kildow, BSN, RN 

## 2019-01-31 NOTE — Progress Notes (Signed)
   Providing Compassionate, Quality Care - Together   Subjective: Patient reports elevated temperature today. He denies headache. Reports his back is appropriately sore. He feels "itchy" around his incision, but thinks it's the bandage. He is sitting on the edge of his bed eating lunch during this assessment.  Objective: Vital signs in last 24 hours: Temp:  [98 F (36.7 C)-101.4 F (38.6 C)] 101.4 F (38.6 C) (06/10 1248) Pulse Rate:  [82-117] 117 (06/10 1248) Resp:  [16-20] 20 (06/10 1248) BP: (121-146)/(71-87) 146/87 (06/10 1248) SpO2:  [90 %-99 %] 90 % (06/10 1248)  Intake/Output from previous day: 06/09 0701 - 06/10 0700 In: 340 [P.O.:340] Out: 1905 [Urine:1905] Intake/Output this shift: Total I/O In: -  Out: 100 [Urine:100]  Alert and oriented x 4 PERRLA MAE, Strength 5/5 BUE, BLE Incision with old drainage, there does not appear to be any new drainage coming from the incision; honeycomb dressing in place   Lab Results: Recent Labs    01/30/19 0358  WBC 14.4*  HGB 14.7  HCT 44.2  PLT 159   BMET Recent Labs    01/30/19 0358  NA 137  K 4.4  CL 108  CO2 22  GLUCOSE 132*  BUN 21  CREATININE 1.71*  CALCIUM 8.4*    Studies/Results: Dg Lumbar Spine 2-3 Views  Result Date: 01/29/2019 CLINICAL DATA:  Surgical posterior fusion of L2-3. EXAM: LUMBAR SPINE - 2-3 VIEW; DG C-ARM 61-120 MIN FLUOROSCOPY TIME:  10 seconds. COMPARISON:  MRI of October 17, 2018. FINDINGS: Two intraoperative fluoroscopic images were obtained of the lumbar spine. These images demonstrate the patient be status post interval surgical posterior fusion of L2-3 with bilateral intrapedicular screw placement and interbody fusion. IMPRESSION: Fluoroscopic guidance was provided during surgical posterior fusion of L2-3. Electronically Signed   By: Marijo Conception M.D.   On: 01/29/2019 19:28   Dg C-arm 1-60 Min  Result Date: 01/29/2019 CLINICAL DATA:  Surgical posterior fusion of L2-3. EXAM: LUMBAR  SPINE - 2-3 VIEW; DG C-ARM 61-120 MIN FLUOROSCOPY TIME:  10 seconds. COMPARISON:  MRI of October 17, 2018. FINDINGS: Two intraoperative fluoroscopic images were obtained of the lumbar spine. These images demonstrate the patient be status post interval surgical posterior fusion of L2-3 with bilateral intrapedicular screw placement and interbody fusion. IMPRESSION: Fluoroscopic guidance was provided during surgical posterior fusion of L2-3. Electronically Signed   By: Marijo Conception M.D.   On: 01/29/2019 19:28    Assessment/Plan: Anthony [name] is two days status post L2-3 decompression and posterior fusion. His temperature is slightly elevated today. Otherwise, he has no new complaints.   LOS: 2 days    -Will order CXR, CBC, UA, and urine culture due to elevated temp -PT and OT have been ordered -Pt up with assistance -Honeycomb dressing removed; Please contact Neurosurgery if any clear drainage is noted   Viona Gilmore, DNP, AGNP-C Nurse Practitioner  [facility]. [address], [place], [place], [place] [address] P: [phone]    F: [phone]  01/31/2019, 1:54 PM

## 2019-01-31 NOTE — Evaluation (Signed)
Physical Therapy Treatment Patient Details Name: Anthony [name] MRN: [id number] DOB: [birth year] Today's Date: 01/31/2019    History of Present Illness Patient is S/P L2-L3 Lumbar decmpression and fusion on 01/29/2019. He had a previous fusion of his lower vertabre but was having increased lower back pain and leg weakness. He was mobilized with nursing yesterday. PMH: previous lumbar fusion; renal insuffciency, numbness of fingers, hamturia, chronic back pain, DDD, OA, mitral valve prolapse     PT Comments    Patient presents with decreased endurance and increased pain with ambulation. He also had pain with bed mobility. He did well with his proper log roll. He has had lumbar surgery before. He was able to recall precautions with some cuing. He may require further education. He would benefit from skilled acute therapy to improve his gait distance and to evaluate his ability to do stairs. He may also benefit from home health depending on how he progresses.   Follow Up Recommendations  Home health PT     Equipment Recommendations  Rolling walker with 5" wheels;3in1 (PT)(could possibly use a bench for his shower )    Recommendations for Other Services       Precautions / Restrictions Precautions Precautions: Back Precaution Booklet Issued: No Precaution Comments: reviewed BLT percautions with the patient  Required Braces or Orthoses: Spinal Brace Spinal Brace: Lumbar corset Restrictions Weight Bearing Restrictions: No    Mobility  Bed Mobility Overal bed mobility: Needs Assistance Bed Mobility: Supine to Sit;Sit to Supine     Supine to sit: Min guard Sit to supine: Min guard   General bed mobility comments: min gaurd and cuing for proper log roll. Min a to scoot back up in bed after getting back to bed. Patietn  Transfers Overall transfer level: Needs assistance Equipment used: Rolling walker (2 wheeled) Transfers: Sit to/from Stand Sit to Stand: Min guard          General transfer comment: Min gaurd and cuing for hand placement.   Ambulation/Gait Ambulation/Gait assistance: Min guard Gait Distance (Feet): 35 Feet Assistive device: Rolling walker (2 wheeled) Gait Pattern/deviations: Step-to pattern Gait velocity: decreased Gait velocity interpretation: <1.31 ft/sec, indicative of household ambulator General Gait Details: slow step to gait pattern    Stairs             Wheelchair Mobility    Modified Rankin (Stroke Patients Only)       Balance Overall balance assessment: Needs assistance Sitting-balance support: No upper extremity supported;Feet supported Sitting balance-Leahy Scale: Good     Standing balance support: Bilateral upper extremity supported Standing balance-Leahy Scale: Poor                              Cognition Arousal/Alertness: Awake/alert Behavior During Therapy: WFL for tasks assessed/performed Overall Cognitive Status: Within Functional Limits for tasks assessed                                        Exercises      General Comments General comments (skin integrity, edema, etc.): expected pain in the surgical site      Pertinent Vitals/Pain Pain Assessment: Faces Faces Pain Scale: Hurts even more Pain Location: lower back  Pain Descriptors / Indicators: Aching Pain Intervention(s): Limited activity within patient's tolerance;Monitored during session;Patient requesting pain meds-RN notified    Home Living Family/patient expects  to be discharged to:: Private residence Living Arrangements: Alone Available Help at Discharge: Neighbor Type of Home: House Home Access: Stairs to enter Entrance Stairs-Rails: Can reach both Home Layout: One level Home Equipment: Cane - single point      Prior Function Level of Independence: Independent      Comments: was not using cane. Mobility was decreasing as leg weakness progressed.    PT Goals (current goals can now be found  in the care plan section) Acute Rehab PT Goals Patient Stated Goal: less pain  PT Goal Formulation: With patient Time For Goal Achievement: 01/31/19 Potential to Achieve Goals: Good    Frequency    Min 5X/week      PT Plan      Co-evaluation              AM-PAC PT "6 Clicks" Mobility   Outcome Measure  Help needed turning from your back to your side while in a flat bed without using bedrails?: A Little Help needed moving from lying on your back to sitting on the side of a flat bed without using bedrails?: A Little Help needed moving to and from a bed to a chair (including a wheelchair)?: A Little Help needed standing up from a chair using your arms (e.g., wheelchair or bedside chair)?: A Little Help needed to walk in hospital room?: A Little Help needed climbing 3-5 steps with a railing? : A Lot 6 Click Score: 17    End of Session Equipment Utilized During Treatment: Gait belt Activity Tolerance: Patient tolerated treatment well Patient left: in bed;with call bell/phone within reach;with nursing/sitter in room(per patient request ) Nurse Communication: Mobility status PT Visit Diagnosis: Other abnormalities of gait and mobility (R26.89);Muscle weakness (generalized) (M62.81);Pain Pain - Right/Left: (bilateral) Pain - part of body: Leg     Time: [id number] PT Time Calculation (min) (ACUTE ONLY): 29 min  Charges:                           Dessie Comaavid J Maynard Damarri Rampy PT DPT  01/31/2019, 4:07 PM

## 2019-02-01 LAB — URINALYSIS, ROUTINE W REFLEX MICROSCOPIC
Bacteria, UA: NONE SEEN
Bilirubin Urine: NEGATIVE
Glucose, UA: NEGATIVE mg/dL
Hgb urine dipstick: NEGATIVE
Ketones, ur: 20 mg/dL — AB
Leukocytes,Ua: NEGATIVE
Nitrite: NEGATIVE
Protein, ur: 30 mg/dL — AB
Specific Gravity, Urine: 1.021 (ref 1.005–1.030)
pH: 6 (ref 5.0–8.0)

## 2019-02-01 NOTE — Progress Notes (Signed)
Subjective: The patient is alert and pleasant.  He looks and feels better.  He is not ready to go home.  He denies headaches.  Objective: Vital signs in last 24 hours: Temp:  [98.4 F (36.9 C)-101.4 F (38.6 C)] 98.6 F (37 C) (06/11 0336) Pulse Rate:  [92-128] 97 (06/11 0336) Resp:  [14-20] 14 (06/11 0336) BP: (96-146)/(66-87) 123/81 (06/11 0336) SpO2:  [90 %-94 %] 92 % (06/11 0336) Estimated body mass index is 27.24 kg/m as calculated from the following:   Height as of this encounter: 5\' 10"  (1.778 m).   Weight as of this encounter: 86.1 kg.   Intake/Output from previous day: 06/10 0701 - 06/11 0700 In: 340 [P.O.:340] Out: 510 [Urine:510] Intake/Output this shift: No intake/output data recorded.  Physical exam the patient is alert and oriented.  He is moving his lower extremities well.  His wound is healing well without signs of infection.  The patient's white count is down to 11.7.  His urinalysis was negative.  His chest x-ray demonstrates atelectasis.  Lab Results: Recent Labs    01/30/19 0358 01/31/19 1436  WBC 14.4* 11.7*  HGB 14.7 14.3  HCT 44.2 43.5  PLT 159 137*   BMET Recent Labs    01/30/19 0358  NA 137  K 4.4  CL 108  CO2 22  GLUCOSE 132*  BUN 21  CREATININE 1.71*  CALCIUM 8.4*    Studies/Results: Dg Chest Port 1 View  Result Date: 01/31/2019 CLINICAL DATA:  Elevated temperature. EXAM: PORTABLE CHEST 1 VIEW COMPARISON:  Radiograph of May 05, 2015. FINDINGS: The heart size and mediastinal contours are within normal limits. No pneumothorax or pleural effusion is noted. Mild bibasilar subsegmental atelectasis is noted. The visualized skeletal structures are unremarkable. IMPRESSION: Mild bibasilar subsegmental atelectasis. Electronically Signed   By: Marijo Conception M.D.   On: 01/31/2019 14:59    Assessment/Plan: Stop day #3: The patient is progressing well.  I encouraged him to mobilize.  He likely go home tomorrow.  Low-grade fever: The  patient had a temperature to 101.4.  His wound looks fine.  His urinalysis is negative.  Chest x-ray is negative.  His white count is 11.  LOS: 3 days     Anthony [name] 02/01/2019, 7:41 AM

## 2019-02-01 NOTE — Progress Notes (Signed)
Physical Therapy Treatment Patient Details Name: Anthony [name] MRN: [id number] DOB: [birth year] Today's Date: 02/01/2019    History of Present Illness Patient is S/P L2-L3 Lumbar decmpression and fusion on 01/29/2019. He had a previous fusion of his lower vertabre but was having increased lower back pain and leg weakness. He was mobilized with nursing yesterday. PMH: previous lumbar fusion; renal insuffciency, numbness of fingers, hamturia, chronic back pain, DDD, OA, mitral valve prolapse     PT Comments    Patient increased gait distance and had less pain into his legs. He is making progress. He was somewhat limited today by a feeling of urinary urgency. He would benefit from further gait training for stairs prior to discharge. He was encouraged to try to walk again later with nursing.    Follow Up Recommendations  Home health PT     Equipment Recommendations  Rolling walker with 5" wheels;3in1 (PT)(could possibly use a bench for his shower )    Recommendations for Other Services       Precautions / Restrictions Precautions Precautions: Back Precaution Booklet Issued: No Precaution Comments: reviewed BLT percautions with the patient  Required Braces or Orthoses: Spinal Brace Spinal Brace: Lumbar corset Restrictions Weight Bearing Restrictions: No    Mobility  Bed Mobility Overal bed mobility: Needs Assistance Bed Mobility: Supine to Sit;Sit to Supine     Supine to sit: Min guard Sit to supine: Min guard   General bed mobility comments: min gaurd and cuing for proper log roll. Min a to scoot back up in bed after getting back to bed. Patietn  Transfers Overall transfer level: Needs assistance Equipment used: Rolling walker (2 wheeled) Transfers: Sit to/from Stand Sit to Stand: Supervision         General transfer comment: supervision for sit to stand. Transfered from commode 2x and bed 3x while trying to urinate.   Ambulation/Gait Ambulation/Gait assistance: Min  guard Gait Distance (Feet): (60) Assistive device: Rolling walker (2 wheeled) Gait Pattern/deviations: Step-to pattern Gait velocity: decreased Gait velocity interpretation: <1.31 ft/sec, indicative of household ambulator General Gait Details: slow but steady gait pattern again. Less pain down into the legs . Ket in the room because patient felt like he may have incontinence issues.    Stairs             Wheelchair Mobility    Modified Rankin (Stroke Patients Only)       Balance Overall balance assessment: Needs assistance Sitting-balance support: No upper extremity supported;Feet supported Sitting balance-Leahy Scale: Good     Standing balance support: Bilateral upper extremity supported Standing balance-Leahy Scale: Poor                              Cognition Arousal/Alertness: Awake/alert Behavior During Therapy: WFL for tasks assessed/performed Overall Cognitive Status: Within Functional Limits for tasks assessed                                        Exercises      General Comments        Pertinent Vitals/Pain Pain Assessment: 0-10 Pain Score: 5  Pain Location: lower back  Pain Descriptors / Indicators: Aching Pain Intervention(s): Limited activity within patient's tolerance    Home Living Family/patient expects to be discharged to:: Private residence Living Arrangements: Alone Available Help at Discharge: Neighbor Type of Home:  House Home Access: Stairs to enter Entrance Stairs-Rails: Can reach both Home Layout: One level Home Equipment: Cane - single point      Prior Function Level of Independence: Independent      Comments: was not using cane. Mobility was decreasing as leg weakness progressed.    PT Goals (current goals can now be found in the care plan section) Acute Rehab PT Goals Patient Stated Goal: less pain  PT Goal Formulation: With patient Time For Goal Achievement: 01/31/19 Potential to Achieve  Goals: Good    Frequency    Min 5X/week      PT Plan Current plan remains appropriate    Co-evaluation              AM-PAC PT "6 Clicks" Mobility   Outcome Measure  Help needed turning from your back to your side while in a flat bed without using bedrails?: A Little Help needed moving from lying on your back to sitting on the side of a flat bed without using bedrails?: A Little Help needed moving to and from a bed to a chair (including a wheelchair)?: A Little Help needed standing up from a chair using your arms (e.g., wheelchair or bedside chair)?: A Little Help needed to walk in hospital room?: A Little Help needed climbing 3-5 steps with a railing? : A Lot 6 Click Score: 17    End of Session Equipment Utilized During Treatment: Gait belt Activity Tolerance: Patient tolerated treatment well Patient left: in bed;with call bell/phone within reach;with nursing/sitter in room(per patient request ) Nurse Communication: Mobility status PT Visit Diagnosis: Other abnormalities of gait and mobility (R26.89);Muscle weakness (generalized) (M62.81);Pain Pain - Right/Left: (bilateral) Pain - part of body: Leg     Time: 1031-1056 PT Time Calculation (min) (ACUTE ONLY): 25 min  Charges:  $Gait Training: 8-22 mins $Therapeutic Activity: 8-22 mins                       Dessie Comaavid J Alexsus Papadopoulos PT DPT  02/01/2019, 11:28 AM

## 2019-02-01 NOTE — Evaluation (Signed)
Occupational Therapy Evaluation Patient Details Name: Anthony [name] MRN: [id number] DOB: 11/30/1950 Today's Date: 02/01/2019    History of Present Illness Patient is S/P L2-L3 Lumbar decmpression and fusion on 01/29/2019. He had a previous fusion of his lower vertabre but was having increased lower back pain and leg weakness. He was mobilized with nursing yesterday. PMH: previous lumbar fusion; renal insuffciency, numbness of fingers, hamturia, chronic back pain, DDD, OA, mitral valve prolapse    Clinical Impression   Pt PTA: pt living alone with helpful neighbor who assists pt. Pt was less ambulatory and had increasing pain and increasing weakness in BLEs. Pt currently performing ADL with set-upA to minA for LB ADL.Pt aware of LB adaptive equipment and plans to purchase some for home. Pt performing ADL functional mobility in room with donning lumbar corset brace with set-upA and stood at sink for light grooming for 4 mins with fair balance. Education provided on brace management, ADL and safety with mobility. Pt would benefit from continued OT skilled services for ADL and energy conservation. OT following acutely.  (pt reports that he could live with his son for a while if needed)    Follow Up Recommendations  Home health OT;Supervision/Assistance - 24 hour    Equipment Recommendations  None recommended by OT    Recommendations for Other Services       Precautions / Restrictions Precautions Precautions: Back Precaution Booklet Issued: No Precaution Comments: reviewed BLT percautions with the patient  Required Braces or Orthoses: Spinal Brace Spinal Brace: Lumbar corset Restrictions Weight Bearing Restrictions: No      Mobility Bed Mobility Overal bed mobility: Needs Assistance Bed Mobility: Supine to Sit;Sit to Supine     Supine to sit: Min guard Sit to supine: Min guard   General bed mobility comments: sitting EOB eating breakfast  Transfers Overall transfer level:  Needs assistance Equipment used: Rolling walker (2 wheeled) Transfers: Sit to/from Stand Sit to Stand: Supervision         General transfer comment: supervision for sit to stand. Transfered from commode 2x and bed 3x while trying to urinate.     Balance Overall balance assessment: Needs assistance Sitting-balance support: No upper extremity supported;Feet supported Sitting balance-Leahy Scale: Good     Standing balance support: Bilateral upper extremity supported Standing balance-Leahy Scale: Poor                             ADL either performed or assessed with clinical judgement   ADL Overall ADL's : Needs assistance/impaired Eating/Feeding: Modified independent;Sitting   Grooming: Min guard;Standing;Cueing for safety   Upper Body Bathing: Set up;Sitting   Lower Body Bathing: Minimal assistance;Sitting/lateral leans;Sit to/from stand;With adaptive equipment   Upper Body Dressing : Set up;Sitting   Lower Body Dressing: Minimal assistance;Sitting/lateral leans;Sit to/from stand;With adaptive equipment   Toilet Transfer: Min guard;Regular Toilet;Grab bars   Toileting- Clothing Manipulation and Hygiene: Minimal assistance;Sitting/lateral lean;Sit to/from stand;Cueing for sequencing;Cueing for safety       Functional mobility during ADLs: Min guard;Rolling walker General ADL Comments: Unable to perform LB dressing with figure 4; pt remembered how to use AE so no education required per request.     Vision Baseline Vision/History: Wears glasses Vision Assessment?: No apparent visual deficits     Perception     Praxis      Pertinent Vitals/Pain Pain Assessment: 0-10 Pain Score: 5  Pain Location: lower back  Pain Descriptors / Indicators: Aching Pain  Intervention(s): Limited activity within patient's tolerance     Hand Dominance Right   Extremity/Trunk Assessment Upper Extremity Assessment Upper Extremity Assessment: Overall WFL for tasks  assessed   Lower Extremity Assessment Lower Extremity Assessment: Generalized weakness   Cervical / Trunk Assessment Cervical / Trunk Assessment: Normal   Communication Communication Communication: No difficulties   Cognition Arousal/Alertness: Awake/alert Behavior During Therapy: WFL for tasks assessed/performed Overall Cognitive Status: Within Functional Limits for tasks assessed                                     General Comments  handout provided with back precautions; pt education on LB AE, but reported that he knew how to use them from prior surgeries and planned to buy them.    Exercises     Shoulder Instructions      Home Living Family/patient expects to be discharged to:: Private residence Living Arrangements: Alone Available Help at Discharge: Neighbor Type of Home: House Home Access: Stairs to enter Secretary/administratorntrance Stairs-Number of Steps: 5 Entrance Stairs-Rails: Can reach both Home Layout: One level     Bathroom Shower/Tub: Producer, television/film/videoWalk-in shower   Bathroom Toilet: Standard Bathroom Accessibility: Yes How Accessible: Accessible via walker Home Equipment: Cane - single point          Prior Functioning/Environment Level of Independence: Independent        Comments: was not using cane. Mobility was decreasing as leg weakness progressed.         OT Problem List: Decreased strength;Decreased activity tolerance;Impaired balance (sitting and/or standing);Decreased coordination;Decreased safety awareness;Pain      OT Treatment/Interventions: Self-care/ADL training;Therapeutic exercise;Neuromuscular education;Energy conservation;Therapeutic activities;Patient/family education;Balance training    OT Goals(Current goals can be found in the care plan section) Acute Rehab OT Goals Patient Stated Goal: less pain  OT Goal Formulation: With patient Time For Goal Achievement: 02/15/19 Potential to Achieve Goals: Good ADL Goals Pt Will Perform Lower Body  Dressing: with modified independence;sit to/from stand Pt Will Perform Toileting - Clothing Manipulation and hygiene: with modified independence;sit to/from stand Additional ADL Goal #1: Pt will perform OOB ADL with superivisionA level with fair balance  OT Frequency: Min 2X/week   Barriers to D/C:            Co-evaluation              AM-PAC OT "6 Clicks" Daily Activity     Outcome Measure Help from another person eating meals?: None Help from another person taking care of personal grooming?: None Help from another person toileting, which includes using toliet, bedpan, or urinal?: A Little Help from another person bathing (including washing, rinsing, drying)?: A Lot Help from another person to put on and taking off regular upper body clothing?: A Little Help from another person to put on and taking off regular lower body clothing?: A Lot 6 Click Score: 18   End of Session Equipment Utilized During Treatment: Gait belt;Rolling walker Nurse Communication: Mobility status  Activity Tolerance: Patient limited by pain Patient left: in chair;with call bell/phone within reach;with chair alarm set  OT Visit Diagnosis: Unsteadiness on feet (R26.81);Muscle weakness (generalized) (M62.81)                Time: [id number] OT Time Calculation (min): 34 min Charges:  OT General Charges $OT Visit: 1 Visit OT Evaluation $OT Eval Moderate Complexity: 1 Mod OT Treatments $Self Care/Home Management : 8-22 mins  Revonda StandardAllison (  Ivin Poot OTR/L Acute Rehabilitation Services Pager: [phone] Office: [place] 02/01/2019, 2:27 PM

## 2019-02-01 NOTE — Plan of Care (Signed)
Progressing towards goals

## 2019-02-01 NOTE — TOC Initial Note (Signed)
Transition of Care [facility](TOC) - Initial/Assessment Note    Patient Details  Name: Anthony LeffWilliam R Brisk MRN: [id number] Date of Birth: 07/05/1951  Transition of Care [facility](TOC) CM/SW Contact:    Kermit BaloKelli F Charelle Petrakis, RN Phone Number: 02/01/2019, 11:41 AM  Clinical Narrative:                 Pt plans on staying with his neighbor initially after d/c. He states she can provided the needed assistance. She also has a ramp on her home. [facility]. TOC following.  Expected Discharge Plan: Home w Home Health Services Barriers to Discharge: Continued Medical Work up   Patient Goals and CMS Choice Patient states their goals for this hospitalization and ongoing recovery are:: to get moving around better CMS Medicare.gov Compare Post Acute Care list provided to:: Patient Choice offered to / list presented to : Patient  Expected Discharge Plan and Services Expected Discharge Plan: Home w Home Health Services   Discharge Planning Services: CM Consult Post Acute Care Choice: Home Health, Durable Medical Equipment Living arrangements for the past 2 months: Mobile Home                 DME Arranged: 3-N-1, Walker rolling         HH Arranged: PT          Prior Living Arrangements/Services Living arrangements for the past 2 months: Mobile Home Lives with:: Self Patient language and need for interpreter reviewed:: Yes(no needs) Do you feel safe going back to the place where you live?: Yes      Need for Family Participation in Patient Care: Yes (Comment) Care giver support system in place?: Yes (comment)(pt is going to stay with his neighbor) Current home services: DME(cane) Criminal Activity/Legal Involvement Pertinent to Current Situation/Hospitalization: No - Comment as needed  Activities of Daily Living   ADL Screening (condition at time of admission) Patient's cognitive ability adequate to safely complete daily activities?: Yes Is the patient deaf or have difficulty  hearing?: No Does the patient have difficulty seeing, even when wearing glasses/contacts?: No Does the patient have difficulty concentrating, remembering, or making decisions?: No Patient able to express need for assistance with ADLs?: Yes Does the patient have difficulty dressing or bathing?: No Independently performs ADLs?: Yes (appropriate for developmental age) Does the patient have difficulty walking or climbing stairs?: No Weakness of Legs: Both Weakness of Arms/Hands: None  Permission Sought/Granted                  Emotional Assessment Appearance:: Appears stated age Attitude/Demeanor/Rapport: Engaged Affect (typically observed): Accepting, Calm, Pleasant Orientation: : Oriented to Self, Oriented to Place, Oriented to  Time, Oriented to Situation   Psych Involvement: No (comment)  Admission diagnosis:  Lumbar adjacent segment disease with spondylolisthesis Patient Active Problem List   Diagnosis Date Noted  . Spinal stenosis of lumbar region with neurogenic claudication 01/29/2019  . Acute prostatitis 11/07/2017  . Nausea   . Hiatal hernia   . Gastritis   . Elevated transaminase level 09/18/2015  . Hyperbilirubinemia 09/18/2015  . GERD (gastroesophageal reflux disease) 09/18/2015  . BPH (benign prostatic hyperplasia) 09/18/2015  . Special screening for malignant neoplasms, colon   . Second degree hemorrhoids   . Acute cholecystitis 04/19/2015  . Cholecystitis   . Epigastric abdominal pain   . Right ureteral calculus 02/05/2015  . Left ureteral calculus 02/05/2015  . CKD (chronic kidney disease), stage III (HCC) 02/05/2015  . Ureteral stone  02/04/2015   PCP:  Leonard Downing, MD Pharmacy:   [id number] - [place], [address] [place] [address] Phone: [phone] Fax: [phone]     Social Determinants of Health (SDOH) Interventions    Readmission Risk Interventions No flowsheet data  found.

## 2019-02-01 NOTE — Progress Notes (Signed)
[place] office notified of needing prescription for rolling walker DME.

## 2019-02-02 LAB — URINE CULTURE: Culture: NO GROWTH

## 2019-02-02 MED ORDER — CYCLOBENZAPRINE HCL 10 MG PO TABS: 10.0000 mg | ORAL_TABLET | Freq: Three times a day (TID) | ORAL | 0 refills | Status: DC | PRN

## 2019-02-02 MED ORDER — OXYCODONE HCL 10 MG PO TABS: 10.0000 mg | ORAL_TABLET | ORAL | 0 refills | Status: DC | PRN

## 2019-02-02 MED ORDER — DOCUSATE SODIUM 100 MG PO CAPS: 100.0000 mg | ORAL_CAPSULE | Freq: Two times a day (BID) | ORAL | 0 refills | Status: DC

## 2019-02-02 NOTE — Progress Notes (Signed)
Physical Therapy Treatment Patient Details Name: Anthony [name] MRN: [id number] DOB: [birth year] Today's Date: 02/02/2019    History of Present Illness Patient is S/P L2-L3 Lumbar decmpression and fusion on 01/29/2019. He had a previous fusion of his lower vertabre but was having increased lower back pain and leg weakness. He was mobilized with nursing yesterday. PMH: previous lumbar fusion; renal insuffciency, numbness of fingers, hamturia, chronic back pain, DDD, OA, mitral valve prolapse     PT Comments    Patient was able to safely go up and down 8 steps with supervision. He has 5 to get into his house. He can reach both rails. His stability with gait has improved.  Therapy advised the patient to keep walking at home and showed him light exercises that he can perform. He is independent with his percautions and can don/dof his brace. Patient is safe to go home from a PT standpoint.   Follow Up Recommendations  Home health PT     Equipment Recommendations  Rolling walker with 5" wheels;3in1 (PT)(could possibly use a bench for his shower )    Recommendations for Other Services       Precautions / Restrictions Precautions Precautions: Back Precaution Booklet Issued: No Precaution Comments: reviewed BLT percautions with the patient  Required Braces or Orthoses: Spinal Brace Spinal Brace: Lumbar corset Restrictions Weight Bearing Restrictions: No    Mobility  Bed Mobility               General bed mobility comments: Patient found sitting EOB. He reports he got up on his own   Transfers Overall transfer level: Needs assistance   Transfers: Sit to/from Stand Sit to Stand: Supervision            Ambulation/Gait Ambulation/Gait assistance: Supervision Gait Distance (Feet): 60 Feet Assistive device: Rolling walker (2 wheeled) Gait Pattern/deviations: Step-through pattern Gait velocity: decreased   General Gait Details: continues to have slight trunk flexion but has  improved satability with gait    Stairs Stairs: Yes Stairs assistance: Supervision Stair Management: Two rails Number of Stairs: 8 General stair comments: supervision for steps   Wheelchair Mobility    Modified Rankin (Stroke Patients Only)       Balance Overall balance assessment: Needs assistance Sitting-balance support: No upper extremity supported;Feet supported Sitting balance-Leahy Scale: Good     Standing balance support: Bilateral upper extremity supported Standing balance-Leahy Scale: Poor Standing balance comment: continues to require rolling walker but doing better.                             Cognition Arousal/Alertness: Awake/alert Behavior During Therapy: WFL for tasks assessed/performed Overall Cognitive Status: Within Functional Limits for tasks assessed                                        Exercises General Exercises - Lower Extremity Quad Sets: (reviewed for home ) Gluteal Sets: (reviewed for home ) Long [employer]: (reviewed for home )    General Comments        Pertinent Vitals/Pain Pain Assessment: 0-10 Pain Score: 5  Pain Location: lower back  Pain Descriptors / Indicators: Aching Pain Intervention(s): Limited activity within patient's tolerance;Monitored during session    Home Living  Prior Function            PT Goals (current goals can now be found in the care plan section) Acute Rehab PT Goals Patient Stated Goal: less pain  PT Goal Formulation: With patient Time For Goal Achievement: 01/31/19 Potential to Achieve Goals: Good    Frequency    Min 5X/week      PT Plan Current plan remains appropriate    Co-evaluation              AM-PAC PT "6 Clicks" Mobility   Outcome Measure  Help needed turning from your back to your side while in a flat bed without using bedrails?: A Little Help needed moving from lying on your back to sitting on the side of a  flat bed without using bedrails?: A Little Help needed moving to and from a bed to a chair (including a wheelchair)?: A Little Help needed standing up from a chair using your arms (e.g., wheelchair or bedside chair)?: A Little Help needed to walk in hospital room?: A Little Help needed climbing 3-5 steps with a railing? : A Lot 6 Click Score: 17    End of Session Equipment Utilized During Treatment: Gait belt Activity Tolerance: Patient tolerated treatment well Patient left: in bed;with call bell/phone within reach;with nursing/sitter in room(per patient request ) Nurse Communication: Mobility status PT Visit Diagnosis: Other abnormalities of gait and mobility (R26.89);Muscle weakness (generalized) (M62.81);Pain Pain - Right/Left: (bilateral) Pain - part of body: Leg     Time: [id number] PT Time Calculation (min) (ACUTE ONLY): 23 min  Charges:  $Gait Training: 23-37 mins                       Dessie Comaavid J Spenser Cong PT DPT  02/02/2019, 9:31 AM

## 2019-02-02 NOTE — Discharge Summary (Signed)
Physician Discharge Summary  Patient ID: Anthony [name] MRN: [id number] DOB/AGE: 02-17-1951 68 y.o.  Admit date: 01/29/2019 Discharge date: 02/02/2019  Admission Diagnoses:Lumbar degenerative disease, lumbar spinal stenosis, lumbago, lumbar radiculopathy, neurogenic claudication  Discharge Diagnoses:  The same Active Problems:   Spinal stenosis of lumbar region with neurogenic claudication   Discharged Condition: good  Hospital Course:  I performed an exploration the patient's prior lumbar fusion with extension to  L2-3 on 01/29/2019.    The patient's postoperative course was unremarkable.  He was progressively mobilized with PT and OT.    The patient had a low-grade fever.  This was worked up with a chest x-ray, urinalysis and CBC which turned out okay.  On 02/02/2019 the patient requested discharge home.  He was given written in oral discharge instructions.  All his questions were answered.  Consults:  PT, OT, Care Management Significant Diagnostic Studies: chest x-ray Treatments:  Exploration of lumbar fusion, L2-3 decompression, instrumentation and fusion. Discharge Exam: Blood pressure 137/83, pulse 93, temperature 99.5 F (37.5 C), temperature source Oral, resp. rate 14, height 5\' 10"  (1.778 m), weight 86.1 kg, SpO2 92 %.   The patient is alert and pleasant.  He looks well.  His lower extremity strength is normal.  His wound is healing well.  There is no drainage.  Disposition:   Home  Discharge Instructions    Call MD for:  difficulty breathing, headache or visual disturbances   Complete by: As directed    Call MD for:  extreme fatigue   Complete by: As directed    Call MD for:  hives   Complete by: As directed    Call MD for:  persistant dizziness or light-headedness   Complete by: As directed    Call MD for:  persistant nausea and vomiting   Complete by: As directed    Call MD for:  redness, tenderness, or signs of infection (pain, swelling, redness, odor or  green/yellow discharge around incision site)   Complete by: As directed    Call MD for:  severe uncontrolled pain   Complete by: As directed    Call MD for:  temperature >100.4   Complete by: As directed    Diet - low sodium heart healthy   Complete by: As directed    Discharge instructions   Complete by: As directed    Call [phone] for a followup appointment. Take a stool softener while you are using pain medications.   Driving Restrictions   Complete by: As directed    Do not drive for 2 weeks.   Increase activity slowly   Complete by: As directed    Lifting restrictions   Complete by: As directed    Do not lift more than 5 pounds. No excessive bending or twisting.   May shower / Bathe   Complete by: As directed    Remove the dressing for 3 days after surgery.  You may shower, but leave the incision alone.   No dressing needed   Complete by: As directed      Allergies as of 02/02/2019      Reactions   Cheese Other (See Comments)   cramps   Codeine Itching   Lactose Intolerance (gi) Other (See Comments)   GI distress   Morphine And Related Itching      Medication List    STOP taking these medications   levofloxacin 500 MG tablet Commonly known as: LEVAQUIN   naproxen sodium 220 MG tablet Commonly known  as: ALEVE     TAKE these medications   cyclobenzaprine 10 MG tablet Commonly known as: FLEXERIL Take 1 tablet (10 mg total) by mouth 3 (three) times daily as needed for muscle spasms.   docusate sodium 100 MG capsule Commonly known as: COLACE Take 1 capsule (100 mg total) by mouth 2 (two) times daily.   Oxycodone HCl 10 MG Tabs Take 1 tablet (10 mg total) by mouth every 4 (four) hours as needed for severe pain ((score 7 to 10)).   pantoprazole 40 MG tablet Commonly known as: PROTONIX Take 1 tablet (40 mg total) by mouth daily. **PLEASE SCHEDULE A FOLLOW UP APPT**   potassium citrate 10 MEQ (1080 MG) SR tablet Commonly known as: UROCIT-K Take 10 mEq  by mouth 3 (three) times daily.   tamsulosin 0.4 MG Caps capsule Commonly known as: FLOMAX Take 1 capsule (0.4 mg total) by mouth daily.            Durable Medical Equipment  (From admission, onward)         Start     Ordered   02/01/19 1509  For home use only DME 3 n 1  Once     02/01/19 1508           Signed: Cristi LoronJeffrey D Tayvon Culley 02/02/2019, 7:11 AM

## 2019-02-02 NOTE — Progress Notes (Addendum)
Occupational Therapy Treatment Patient Details Name: Anthony [name] MRN: [id number] DOB: 03/17/1951 Today's Date: 02/02/2019    History of present illness Patient is S/P L2-L3 Lumbar decmpression and fusion on 01/29/2019. He had a previous fusion of his lower vertabre but was having increased lower back pain and leg weakness. He was mobilized with nursing yesterday. PMH: previous lumbar fusion; renal insuffciency, numbness of fingers, hamturia, chronic back pain, DDD, OA, mitral valve prolapse    OT comments  Pt progressing toward established goals. Upon OT arrival pt finishing getting dressed. Pt demonstrated how he donned LB clothing, demonstrated good adherence to back precautions. Reviewed energy conservation strategies with provided handout. Pt donned back brace independently. Pt current level of functioning adequate for d/c home with appropriate support when medically ready. Pt has no additional questions at this time. All other OT needs to be addressed at next venue. OT to sign off.    Follow Up Recommendations  Home health OT;Supervision/Assistance - 24 hour    Equipment Recommendations  None recommended by OT    Recommendations for Other Services      Precautions / Restrictions Precautions Precautions: Back Precaution Booklet Issued: No Precaution Comments: reviewed BLT percautions with the patient  Required Braces or Orthoses: Spinal Brace Spinal Brace: Lumbar corset Restrictions Weight Bearing Restrictions: No       Mobility Bed Mobility               General bed mobility comments: pt sitting EOB upon arrival  Transfers Overall transfer level: Needs assistance Equipment used: Rolling walker (2 wheeled) Transfers: Sit to/from Stand Sit to Stand: Supervision         General transfer comment: supervision for sit<>stand    Balance Overall balance assessment: Needs assistance Sitting-balance support: No upper extremity supported;Feet supported Sitting  balance-Leahy Scale: Good Sitting balance - Comments: donned pants while sitting EOB   Standing balance support: Bilateral upper extremity supported Standing balance-Leahy Scale: Poor Standing balance comment: requires BUE support through RW                           ADL either performed or assessed with clinical judgement   ADL Overall ADL's : Needs assistance/impaired Eating/Feeding: Modified independent;Sitting   Grooming: Min guard;Standing           Upper Body Dressing : Set up;Sitting   Lower Body Dressing: Min guard;Sit to/from stand               Functional mobility during ADLs: Min guard;Rolling walker General ADL Comments: provided energy conservation handout and reviewed strategies with pt;prior to OT arrival pt donned LB and UB clothing, pt demonstrated how he donned pants while adhering to back precautions;pt donned back brace independently;pt with no additional questions at this time     Vision   Vision Assessment?: No apparent visual deficits   Perception     Praxis      Cognition Arousal/Alertness: Awake/alert Behavior During Therapy: WFL for tasks assessed/performed Overall Cognitive Status: Within Functional Limits for tasks assessed                                          Exercises     Shoulder Instructions       General Comments pt verbalized 3/3 back precautions;pt reports he plans to purchase AE at later time  Pertinent Vitals/ Pain       Pain Assessment: No/denies pain Pain Intervention(s): Limited activity within patient's tolerance;Monitored during session  Home Living                                          Prior Functioning/Environment              Frequency  Min 2X/week        Progress Toward Goals  OT Goals(current goals can now be found in the care plan section)  Progress towards OT goals: Progressing toward goals  Acute Rehab OT Goals Patient Stated Goal:  to go home OT Goal Formulation: With patient Time For Goal Achievement: 02/15/19 Potential to Achieve Goals: Good ADL Goals Pt Will Perform Lower Body Dressing: with modified independence;sit to/from stand Pt Will Perform Toileting - Clothing Manipulation and hygiene: with modified independence;sit to/from stand Additional ADL Goal #1: Pt will perform OOB ADL with superivisionA level with fair balance  Plan Discharge plan remains appropriate    Co-evaluation                 AM-PAC OT "6 Clicks" Daily Activity     Outcome Measure   Help from another person eating meals?: None Help from another person taking care of personal grooming?: None Help from another person toileting, which includes using toliet, bedpan, or urinal?: A Little Help from another person bathing (including washing, rinsing, drying)?: A Little Help from another person to put on and taking off regular upper body clothing?: A Little Help from another person to put on and taking off regular lower body clothing?: A Little 6 Click Score: 20    End of Session Equipment Utilized During Treatment: Rolling walker;Back brace  OT Visit Diagnosis: Unsteadiness on feet (R26.81);Muscle weakness (generalized) (M62.81)   Activity Tolerance Patient limited by pain   Patient Left in bed;with call bell/phone within reach   Nurse Communication Mobility status        Time: 2831-5176 OT Time Calculation (min): 8 min  Charges: OT General Charges $OT Visit: 1 Visit OT Treatments $Self Care/Home Management : 8-22 mins  Dorinda Hill OTR/L Acute Rehabilitation Services Office: [place] 02/02/2019, 1:49 PM

## 2019-02-02 NOTE — Plan of Care (Signed)
Pt is progressing toward desired goal for this admission. 

## 2019-02-02 NOTE — TOC Transition Note (Signed)
Transition of Care [facility]) - CM/SW Discharge Note   Patient Details  Name: Anthony [name] MRN: [id number] Date of Birth: 1951/08/02  [facility] CM/SW Contact:  Pollie Friar, RN Phone Number: 02/02/2019, 12:49 PM   Clinical Narrative:    Pt discharging home with [facility]. Pt has transportation home.  3 in 1 delivered from [employer] and walker to be delivered from [facility] home supply.    Final next level of care: [place] Barriers to Discharge: No Barriers Identified   Patient Goals and CMS Choice Patient states their goals for this hospitalization and ongoing recovery are:: to get moving around better CMS Medicare.gov Compare Post Acute Care list provided to:: Patient Choice offered to / list presented to : Patient  Discharge Placement                       Discharge Plan and Services   Discharge Planning Services: CM Consult Post Acute Care Choice: Home Health, Durable Medical Equipment          DME Arranged: Walker rolling, 3-N-1 DME Agency: [employer] Date DME Agency Contacted: 02/01/19   Representative spoke with at DME Agency: [place]: OT, PT [place] Agency: [place] Date [place]: 02/02/19 Time [place]: [phone] Representative spoke with at [place]: [place] ([place]) Interventions     Readmission Risk Interventions No flowsheet data found.

## 2019-02-02 NOTE — Progress Notes (Signed)
Pt d/c home with no new concerns, pt is on RM air, d/c instructions done with teach back, pt verbalize understanding.

## 2019-02-07 DIAGNOSIS — N401 Enlarged prostate with lower urinary tract symptoms: Secondary | ICD-10-CM | POA: Diagnosis not present

## 2019-02-07 DIAGNOSIS — R338 Other retention of urine: Secondary | ICD-10-CM | POA: Diagnosis not present

## 2019-02-13 DIAGNOSIS — M5416 Radiculopathy, lumbar region: Secondary | ICD-10-CM | POA: Diagnosis not present

## 2019-02-14 DIAGNOSIS — R338 Other retention of urine: Secondary | ICD-10-CM | POA: Diagnosis not present

## 2019-02-20 ENCOUNTER — Encounter (HOSPITAL_COMMUNITY): Payer: Self-pay | Admitting: Neurosurgery

## 2019-03-06 DIAGNOSIS — N3000 Acute cystitis without hematuria: Secondary | ICD-10-CM | POA: Diagnosis not present

## 2019-03-06 DIAGNOSIS — R8271 Bacteriuria: Secondary | ICD-10-CM | POA: Diagnosis not present

## 2019-03-26 ENCOUNTER — Other Ambulatory Visit: Payer: Self-pay | Admitting: Gastroenterology

## 2019-04-03 DIAGNOSIS — H5203 Hypermetropia, bilateral: Secondary | ICD-10-CM | POA: Diagnosis not present

## 2019-04-03 DIAGNOSIS — H43813 Vitreous degeneration, bilateral: Secondary | ICD-10-CM | POA: Diagnosis not present

## 2019-04-03 DIAGNOSIS — H25813 Combined forms of age-related cataract, bilateral: Secondary | ICD-10-CM | POA: Diagnosis not present

## 2019-04-12 ENCOUNTER — Ambulatory Visit: Payer: BC Managed Care – PPO | Admitting: Gastroenterology

## 2019-05-06 ENCOUNTER — Emergency Department (HOSPITAL_COMMUNITY): Payer: BC Managed Care – PPO

## 2019-05-06 ENCOUNTER — Other Ambulatory Visit: Payer: Self-pay

## 2019-05-06 ENCOUNTER — Encounter (HOSPITAL_COMMUNITY): Payer: Self-pay | Admitting: Emergency Medicine

## 2019-05-06 ENCOUNTER — Emergency Department (HOSPITAL_COMMUNITY)
Admission: EM | Admit: 2019-05-06 | Discharge: 2019-05-07 | Disposition: A | Discharge status: Home / Self Care | Payer: BC Managed Care – PPO | Attending: Emergency Medicine | Admitting: Emergency Medicine

## 2019-05-06 DIAGNOSIS — Z79899 Other long term (current) drug therapy: Secondary | ICD-10-CM | POA: Diagnosis not present

## 2019-05-06 DIAGNOSIS — Z87891 Personal history of nicotine dependence: Secondary | ICD-10-CM | POA: Insufficient documentation

## 2019-05-06 DIAGNOSIS — Z9049 Acquired absence of other specified parts of digestive tract: Secondary | ICD-10-CM | POA: Diagnosis not present

## 2019-05-06 DIAGNOSIS — R109 Unspecified abdominal pain: Secondary | ICD-10-CM | POA: Diagnosis not present

## 2019-05-06 DIAGNOSIS — R1013 Epigastric pain: Secondary | ICD-10-CM

## 2019-05-06 DIAGNOSIS — N183 Chronic kidney disease, stage 3 (moderate): Secondary | ICD-10-CM | POA: Insufficient documentation

## 2019-05-06 LAB — CBC WITH DIFFERENTIAL/PLATELET
Abs Immature Granulocytes: 0.02 10*3/uL (ref 0.00–0.07)
Basophils Absolute: 0.1 10*3/uL (ref 0.0–0.1)
Basophils Relative: 1 %
Eosinophils Absolute: 0.5 10*3/uL (ref 0.0–0.5)
Eosinophils Relative: 6 %
HCT: 45.4 % (ref 39.0–52.0)
Hemoglobin: 14.6 g/dL (ref 13.0–17.0)
Immature Granulocytes: 0 %
Lymphocytes Relative: 18 %
Lymphs Abs: 1.5 10*3/uL (ref 0.7–4.0)
MCH: 28.9 pg (ref 26.0–34.0)
MCHC: 32.2 g/dL (ref 30.0–36.0)
MCV: 89.7 fL (ref 80.0–100.0)
Monocytes Absolute: 0.7 10*3/uL (ref 0.1–1.0)
Monocytes Relative: 8 %
Neutro Abs: 5.4 10*3/uL (ref 1.7–7.7)
Neutrophils Relative %: 67 %
Platelets: 196 10*3/uL (ref 150–400)
RBC: 5.06 MIL/uL (ref 4.22–5.81)
RDW: 13.9 % (ref 11.5–15.5)
WBC: 8.2 10*3/uL (ref 4.0–10.5)
nRBC: 0 % (ref 0.0–0.2)

## 2019-05-06 LAB — COMPREHENSIVE METABOLIC PANEL
ALT: 19 U/L (ref 0–44)
AST: 30 U/L (ref 15–41)
Albumin: 3.5 g/dL (ref 3.5–5.0)
Alkaline Phosphatase: 131 U/L — ABNORMAL HIGH (ref 38–126)
Anion gap: 9 (ref 5–15)
BUN: 12 mg/dL (ref 8–23)
CO2: 23 mmol/L (ref 22–32)
Calcium: 8.8 mg/dL — ABNORMAL LOW (ref 8.9–10.3)
Chloride: 103 mmol/L (ref 98–111)
Creatinine, Ser: 1.63 mg/dL — ABNORMAL HIGH (ref 0.61–1.24)
GFR calc Af Amer: 50 mL/min — ABNORMAL LOW (ref >60)
GFR calc non Af Amer: 43 mL/min — ABNORMAL LOW (ref >60)
Glucose, Bld: 100 mg/dL — ABNORMAL HIGH (ref 70–99)
Potassium: 4.1 mmol/L (ref 3.5–5.1)
Sodium: 135 mmol/L (ref 135–145)
Total Bilirubin: 0.8 mg/dL (ref 0.3–1.2)
Total Protein: 6.4 g/dL — ABNORMAL LOW (ref 6.5–8.1)

## 2019-05-06 LAB — URINALYSIS, ROUTINE W REFLEX MICROSCOPIC
Bilirubin Urine: NEGATIVE
Glucose, UA: NEGATIVE mg/dL
Ketones, ur: NEGATIVE mg/dL
Nitrite: NEGATIVE
Protein, ur: NEGATIVE mg/dL
Specific Gravity, Urine: 1.009 (ref 1.005–1.030)
WBC, UA: >50 WBC/hpf — ABNORMAL HIGH (ref 0–5)
pH: 6 (ref 5.0–8.0)

## 2019-05-06 LAB — TROPONIN I (HIGH SENSITIVITY)
Troponin I (High Sensitivity): 4 ng/L (ref <18)
Troponin I (High Sensitivity): 4 ng/L (ref <18)

## 2019-05-06 LAB — LIPASE, BLOOD: Lipase: 53 U/L — ABNORMAL HIGH (ref 11–51)

## 2019-05-06 NOTE — Discharge Instructions (Signed)
Get help right away if: You have severe chest pain. You have chest pain that is different from your usual chest pain. You have trouble breathing. You choke. 

## 2019-05-06 NOTE — ED Triage Notes (Signed)
Pt here for sudden onset of sharp abdominal pain in the center of his stomach. Pt states it does not radiate. Hx of gallstones   140/80 82 HR  96% RA 97.7

## 2019-05-06 NOTE — ED Provider Notes (Signed)
[facility] Provider Note   CSN: [id number] Arrival date & time: 05/06/19  1945     History   Chief Complaint Chief Complaint  Patient presents with  . Abdominal Pain    HPI Shela LeffWilliam R Gary is a 68 y.o. male with a past medical history of renal insufficiency, degenerative disc disease, status post cholecystectomy who presents the emergency department with chief complaint of severe epigastric pain.  Patient states that about an hour and a half prior to my evaluation he was at a friend's house when he had sudden onset of severe epigastric pain which he describes as "someone reaching in and grabbing an organ and squeezing his heart as a can."  He states that at the time the pain was 10 out of 10, "brought me to my knees."  He became diaphoretic.  He denies shortness of breath, nausea, vomiting.  Patient states that the pain eased up and he now rates it at about a 2 out of 10.  Patient had the same issue Gollan just after his cholecystectomy.  [facility] was [facility] at that time and admitted without significant findings.  Patient states that they told him they thought it was "muscle spasms.  He does admit to a longstanding history of "very bad reflux."  He takes Protonix daily.  He denies any known heart disease.  He denies that the pain radiates anywhere.  He denies any unilateral leg swelling, history of blood clots.     HPI  Past Medical History:  Diagnosis Date  . Arthritis    hands  . Bilateral ureteral calculi   . Chronic back pain   . Complication of anesthesia    self limited tremors after 04/19/15 cholecystectomy  . DDD (degenerative disc disease), lumbosacral   . GERD (gastroesophageal reflux disease)   . H/O mitral valve prolapse 2008 per echo   asymptomatic and does not see cardiologist.   . Hematuria   . History of kidney stones   . 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 Renal  calculi    left  . Renal insufficiency     Patient Active Problem List   Diagnosis Date Noted  . Spinal stenosis of lumbar region with neurogenic claudication 01/29/2019  . Acute prostatitis 11/07/2017  . Nausea   . Hiatal hernia   . Gastritis   . Elevated transaminase level 09/18/2015  . Hyperbilirubinemia 09/18/2015  . GERD (gastroesophageal reflux disease) 09/18/2015  . BPH (benign prostatic hyperplasia) 09/18/2015  . Special screening for malignant neoplasms, colon   . Second degree hemorrhoids   . Acute cholecystitis 04/19/2015  . Cholecystitis   . Epigastric abdominal pain   . Right ureteral calculus 02/05/2015  . Left ureteral calculus 02/05/2015  . CKD (chronic kidney disease), stage III (HCC) 02/05/2015  . Ureteral stone 02/04/2015    Past Surgical History:  Procedure Laterality Date  . ANTERIOR CERVICAL DECOMP/DISCECTOMY FUSION  C5--C7  06-05-2002//  C7--T1   03-29-2006  . BICEPS TENDON REPAIR Right 2004 approx  . COLONOSCOPY WITH PROPOFOL N/A 06/27/2015   Procedure: COLONOSCOPY WITH PROPOFOL;  Surgeon: Midge Miniumarren Wohl, MD;  [facility];  Service: Endoscopy;  Laterality: N/A;  . CYSTO/  RIGHT RETROGRADE PYELOGRAM/ URETEROSCOPY ATTEMPTED STONE MANIPULATION/  RIGHT STENT PLACEMENT  03-03-2009  . CYSTOSCOPY WITH RETROGRADE PYELOGRAM, URETEROSCOPY AND STENT PLACEMENT Right 01/24/2014   Procedure: CYSTOSCOPY WITH RETROGRADE PYELOGRAM, URETEROSCOPY AND  STENT PLACEMENT, holmium laser, stone extraction;  Surgeon: Danae Chen, MD;  Location: [facility];  Service: Urology;  Laterality: Right;  . CYSTOSCOPY WITH RETROGRADE PYELOGRAM, URETEROSCOPY AND STENT PLACEMENT Right 02/07/2014   Procedure: RIGHT URETEROSCOPY, STONE EXTRACTION AND POSSIBLE STENT PLACEMENT;  Surgeon: Anner Crete, MD;  Location: [facility];  Service: Urology;  Laterality: Right;  . CYSTOSCOPY WITH RETROGRADE PYELOGRAM, URETEROSCOPY AND STENT PLACEMENT Bilateral  02/04/2015   Procedure: CYSTOSCOPY WITH RETROGRADE PYELOGRAM, URETEROSCOPY AND STENT PLACEMENT, BILATERAL;  Surgeon: Jethro Bolus, MD;  Location: [facility];  Service: Urology;  Laterality: Bilateral;  . CYSTOSCOPY WITH URETEROSCOPY, STONE BASKETRY AND STENT PLACEMENT Bilateral 02/20/2015   Procedure: CYSTOSCOPY WITH BILATERAL URETEROSCOPY, STONE EXTRACTION WITH LASER  AND STENT PLACEMENT;  Surgeon: Bjorn Pippin, MD;  Location: [facility];  Service: Urology;  Laterality: Bilateral;  . ESOPHAGOGASTRODUODENOSCOPY (EGD) WITH PROPOFOL N/A 10/17/2015   Procedure: ESOPHAGOGASTRODUODENOSCOPY (EGD) WITH PROPOFOL;  Surgeon: Midge Minium, MD;  Location[facility];  Service: Endoscopy;  Laterality: N/A;  . EXTRACORPOREAL SHOCK WAVE LITHOTRIPSY  Left 02-13-2015//  Right 01-17-2014  . HOLMIUM LASER APPLICATION Right 02/07/2014   Procedure: HOLMIUM LASER APPLICATION;  Surgeon: Anner Crete, MD;  Location: [facility];  Service: Urology;  Laterality: Right;  . HOLMIUM LASER APPLICATION N/A 02/20/2015   Procedure: HOLMIUM LASER APPLICATION;  Surgeon: Bjorn Pippin, MD;  Location: [facility];  Service: Urology;  Laterality: N/A;  . LAPAROSCOPIC CHOLECYSTECTOMY SINGLE PORT N/A 04/19/2015   Procedure: LAPAROSCOPIC CHOLECYSTECTOMY;  Surgeon: Lattie Haw, MD;  Location: [facility];  Service: General;  Laterality: N/A;  . LEFT URETEROSCOPIC LASER LITHOTRIPSY STONE EXTRACTION W/ STENT PLACEMENT  03-09-2011;   09-18-2009  . LEFT URETEROSCOPIC STONE EXTRACTION W/ STENT  09-25-2009  . LUMBAR LAMINECTOMY  x2  ----  L3 - L5//  L5 -- S1  . POSTERIOR FUSION LUMBAR SPINE  09-08-2010   Re-do Laminectomy L3-S1 w/  Decompression and fusion   . RIGHT URETEROSCOPIC STONE EXTRACTION  03-13-2009  . TRANSTHORACIC ECHOCARDIOGRAM  07-04-2007   Mild focal basal septal hypertrophy/  ef 60%/  mild MVP involving anterior and posterior leaflets w/ trivial MR/  trivial pericardial effusion  posterior to the heart        Home Medications    Prior to Admission medications   Medication Sig Start Date End Date Taking? Authorizing Provider  ibuprofen (ADVIL) 200 MG tablet Take 400-600 mg by mouth every 6 (six) hours as needed for headache (pain).   Yes [provider]  naproxen sodium (ALEVE) 220 MG tablet Take 440 mg by mouth 2 (two) times daily as needed (pain).   Yes [provider]  ondansetron (ZOFRAN) 8 MG tablet Take 8 mg by mouth every 8 (eight) hours as needed for nausea or vomiting.   Yes [provider]  pantoprazole (PROTONIX) 40 MG tablet Take 1 tablet (40 mg total) by mouth daily. Patient taking differently: Take 40 mg by mouth daily before breakfast.  03/26/19  Yes Midge Minium, MD  potassium citrate (UROCIT-K) 10 MEQ (1080 MG) SR tablet Take 10 mEq by mouth 3 (three) times daily. 09/16/18  Yes [provider]  tamsulosin (FLOMAX) 0.4 MG CAPS capsule Take 1 capsule (0.4 mg total) by mouth daily. Patient taking differently: Take 0.4 mg by mouth at bedtime.  11/08/17  Yes Delfino Lovett, MD  cyclobenzaprine (FLEXERIL) 10 MG tablet Take 1 tablet (10 mg total) by mouth 3 (three) times  daily as needed for muscle spasms. Patient not taking: Reported on 05/06/2019 02/02/19   Newman Pies, MD  docusate sodium (COLACE) 100 MG capsule Take 1 capsule (100 mg total) by mouth 2 (two) times daily. Patient not taking: Reported on 05/06/2019 02/02/19   Newman Pies, MD  oxyCODONE 10 MG TABS Take 1 tablet (10 mg total) by mouth every 4 (four) hours as needed for severe pain ((score 7 to 10)). Patient not taking: Reported on 05/06/2019 02/02/19   Newman Pies, MD    Family History Family History  Problem Relation Age of Onset  . Cancer Mother   . Diabetes Mother   . Hypertension Mother   . Stroke Mother   . Colon cancer Mother   . Hypertension Brother   . Colon cancer Brother        Pre-cancerous Polyps removed  . Kidney disease Brother         Chronic- secondary to Hypertension  . Stroke Father   . Thyroid disease Daughter   . Colon cancer Maternal Aunt        x 2  . Colon cancer Maternal Uncle     Social History Social History   Tobacco Use  . Smoking status: Former Smoker    Types: Pipe    Quit date: 02/17/2000    Years since quitting: 19.2  . Smokeless tobacco: Never Used  Substance Use Topics  . Alcohol use: Yes    Alcohol/week: 1.0 standard drinks    Types: 1 Shots of liquor per week    Comment: occasionally   . Drug use: No     Allergies   Cheese, Codeine, Lactose intolerance (gi), and Morphine and related   Review of Systems Review of Systems  Ten systems reviewed and are negative for acute change, except as noted in the HPI.   Physical Exam Updated Vital Signs BP (!) 154/98   Pulse 72   Temp 97.6 F (36.4 C) (Oral)   Resp 20   Ht 5\' 10"  (1.778 m)   Wt 79.4 kg   SpO2 95%   BMI 25.11 kg/m   Physical Exam Vitals signs and nursing note reviewed.  Constitutional:      General: He is not in acute distress.    Appearance: He is well-developed. He is not diaphoretic.  HENT:     Head: Normocephalic and atraumatic.  Eyes:     General: No scleral icterus.    Conjunctiva/sclera: Conjunctivae normal.  Neck:     Musculoskeletal: Normal range of motion and neck supple.  Cardiovascular:     Rate and Rhythm: Normal rate and regular rhythm.     Heart sounds: Normal heart sounds.  Pulmonary:     Effort: Pulmonary effort is normal. No respiratory distress.     Breath sounds: Normal breath sounds.  Abdominal:     Palpations: Abdomen is soft.     Tenderness: There is no abdominal tenderness.  Skin:    General: Skin is warm and dry.  Neurological:     Mental Status: He is alert.  Psychiatric:        Behavior: Behavior normal.      ED Treatments / Results  Labs (all labs ordered are listed, but only abnormal results are displayed) Labs Reviewed  COMPREHENSIVE METABOLIC PANEL -  Abnormal; Notable for the following components:      Result Value   Glucose, Bld 100 (*)    Creatinine, Ser 1.63 (*)    Calcium 8.8 (*)  Total Protein 6.4 (*)    Alkaline Phosphatase 131 (*)    GFR calc non Af Amer 43 (*)    GFR calc Af Amer 50 (*)    All other components within normal limits  LIPASE, BLOOD - Abnormal; Notable for the following components:   Lipase 53 (*)    All other components within normal limits  URINALYSIS, ROUTINE W REFLEX MICROSCOPIC - Abnormal; Notable for the following components:   APPearance HAZY (*)    Hgb urine dipstick SMALL (*)    Leukocytes,Ua LARGE (*)    WBC, UA >50 (*)    Bacteria, UA MANY (*)    All other components within normal limits  CBC WITH DIFFERENTIAL/PLATELET  TROPONIN I (HIGH SENSITIVITY)  TROPONIN I (HIGH SENSITIVITY)    EKG EKG Interpretation  Date/Time:  Sunday May 06 2019 21:24:51 EDT Ventricular Rate:  79 PR Interval:    QRS Duration: 87 QT Interval:  388 QTC Calculation: 445 R Axis:   65 Text Interpretation:  Sinus rhythm Confirmed by Kohut, Stephen ([id number]) on 05/06/2019 11:02:18 PM Also confirmed by Kohut, Stephen ([id number]), editor Watlington, Beverly ([id number])  on 05/07/2019 7:45:38 AM   Radiology No results found.  Consistent with his chronic renal insufficiency.  Urine does not appear infected at this procedures Procedures (including critical care time)  Medications Ordered in ED Medications - No data to display   Initial Impression / Assessment and Plan / ED Course  I have reviewed the triage vital signs and the nursing notes.  Pertinent labs & imaging results that were available during my care of the patient were reviewed by me and considered in my medical decision making (see chart for details).        68  year old male with a history of recurrent kidney stones, chronic renal insufficiency followed by nephrology who presents today with acute epigastric pain.  He has had a similar incident in the  past.  He has chronic, severe GERD and takes Protonix daily.  Patient has a low risk heart school and I have very low suspicion for ACS.  He has had 2- high-sensitivity troponins throughout his visit.  Lipase is only mildly elevated.  He has a previous history of cholecystectomy.  Review of the patient's labs shows a slightly elevated blood glucose, elevated creatinine consistent with his chronic renal insufficiency.  Patient does have an elevated white blood cell count and many bacteria in his urine however he is asymptomatic and I feel the patient would benefit from outpatient follow-up with his urologist and nephrologist.  He has no flank pain, fevers or chills.  Colic as the cause of the patient's symptoms today.  He has had no return of the symptoms.  I personally reviewed the patient's plain film of the abdomen and chest which shows no acute abnormalities.  EKG shows normal sinus rhythm without abnormality.  I suspect the patient ultimately is having esophageal spasms.  Patient is referred to gastroenterology.  He appears appropriate for discharge at this time.  See Dr. Richardson Doppole agrees with plan for discharge and outpatient management  Final Clinical Impressions(s) / ED Diagnoses   Final diagnoses:  Epigastric pain    ED Discharge Orders    None       Arthor CaptainHarris, Kita Neace, PA-C 05/09/19 1148    Raeford RazorKohut, Stephen, MD 05/14/19 [phone]

## 2019-05-15 DIAGNOSIS — M5136 Other intervertebral disc degeneration, lumbar region: Secondary | ICD-10-CM | POA: Diagnosis not present

## 2019-05-15 DIAGNOSIS — M48062 Spinal stenosis, lumbar region with neurogenic claudication: Secondary | ICD-10-CM | POA: Diagnosis not present

## 2019-05-15 DIAGNOSIS — R030 Elevated blood-pressure reading, without diagnosis of hypertension: Secondary | ICD-10-CM | POA: Diagnosis not present

## 2019-05-15 DIAGNOSIS — Z6825 Body mass index (BMI) 25.0-25.9, adult: Secondary | ICD-10-CM | POA: Diagnosis not present

## 2019-07-21 ENCOUNTER — Other Ambulatory Visit: Payer: Self-pay | Admitting: Gastroenterology

## 2020-01-14 ENCOUNTER — Emergency Department (HOSPITAL_COMMUNITY): Payer: Worker's Compensation

## 2020-01-14 ENCOUNTER — Encounter (HOSPITAL_COMMUNITY): Payer: Self-pay | Admitting: Internal Medicine

## 2020-01-14 ENCOUNTER — Inpatient Hospital Stay (HOSPITAL_COMMUNITY)
Admission: EM | Admit: 2020-01-14 | Discharge: 2020-01-18 | DRG: 264 | Disposition: A | Discharge status: Home / Self Care | Payer: Worker's Compensation | Attending: Surgery | Admitting: Surgery

## 2020-01-14 DIAGNOSIS — N136 Pyonephrosis: Secondary | ICD-10-CM | POA: Diagnosis present

## 2020-01-14 DIAGNOSIS — S2242XA Multiple fractures of ribs, left side, initial encounter for closed fracture: Secondary | ICD-10-CM | POA: Diagnosis present

## 2020-01-14 DIAGNOSIS — Z800 Family history of malignant neoplasm of digestive organs: Secondary | ICD-10-CM | POA: Diagnosis not present

## 2020-01-14 DIAGNOSIS — R55 Syncope and collapse: Secondary | ICD-10-CM | POA: Diagnosis not present

## 2020-01-14 DIAGNOSIS — E872 Acidosis, unspecified: Secondary | ICD-10-CM | POA: Diagnosis present

## 2020-01-14 DIAGNOSIS — E782 Mixed hyperlipidemia: Secondary | ICD-10-CM | POA: Diagnosis present

## 2020-01-14 DIAGNOSIS — K219 Gastro-esophageal reflux disease without esophagitis: Secondary | ICD-10-CM | POA: Diagnosis not present

## 2020-01-14 DIAGNOSIS — S32009A Unspecified fracture of unspecified lumbar vertebra, initial encounter for closed fracture: Secondary | ICD-10-CM | POA: Diagnosis present

## 2020-01-14 DIAGNOSIS — S32019A Unspecified fracture of first lumbar vertebra, initial encounter for closed fracture: Secondary | ICD-10-CM | POA: Diagnosis present

## 2020-01-14 DIAGNOSIS — S70312A Abrasion, left thigh, initial encounter: Secondary | ICD-10-CM | POA: Diagnosis present

## 2020-01-14 DIAGNOSIS — N401 Enlarged prostate with lower urinary tract symptoms: Secondary | ICD-10-CM | POA: Diagnosis not present

## 2020-01-14 DIAGNOSIS — Z885 Allergy status to narcotic agent status: Secondary | ICD-10-CM

## 2020-01-14 DIAGNOSIS — S22089A Unspecified fracture of T11-T12 vertebra, initial encounter for closed fracture: Secondary | ICD-10-CM | POA: Diagnosis not present

## 2020-01-14 DIAGNOSIS — Y92410 Unspecified street and highway as the place of occurrence of the external cause: Secondary | ICD-10-CM | POA: Diagnosis not present

## 2020-01-14 DIAGNOSIS — S22018A Other fracture of first thoracic vertebra, initial encounter for closed fracture: Secondary | ICD-10-CM | POA: Diagnosis not present

## 2020-01-14 DIAGNOSIS — S2242XD Multiple fractures of ribs, left side, subsequent encounter for fracture with routine healing: Secondary | ICD-10-CM | POA: Diagnosis not present

## 2020-01-14 DIAGNOSIS — R4020 Unspecified coma: Secondary | ICD-10-CM | POA: Diagnosis not present

## 2020-01-14 DIAGNOSIS — S22009A Unspecified fracture of unspecified thoracic vertebra, initial encounter for closed fracture: Secondary | ICD-10-CM | POA: Diagnosis present

## 2020-01-14 DIAGNOSIS — Z91011 Allergy to milk products: Secondary | ICD-10-CM

## 2020-01-14 DIAGNOSIS — S0101XA Laceration without foreign body of scalp, initial encounter: Secondary | ICD-10-CM | POA: Diagnosis present

## 2020-01-14 DIAGNOSIS — T1490XA Injury, unspecified, initial encounter: Secondary | ICD-10-CM

## 2020-01-14 DIAGNOSIS — S32028A Other fracture of second lumbar vertebra, initial encounter for closed fracture: Secondary | ICD-10-CM | POA: Diagnosis not present

## 2020-01-14 DIAGNOSIS — N1330 Unspecified hydronephrosis: Secondary | ICD-10-CM | POA: Diagnosis present

## 2020-01-14 DIAGNOSIS — G8929 Other chronic pain: Secondary | ICD-10-CM | POA: Diagnosis present

## 2020-01-14 DIAGNOSIS — Z20822 Contact with and (suspected) exposure to covid-19: Secondary | ICD-10-CM | POA: Diagnosis present

## 2020-01-14 DIAGNOSIS — R404 Transient alteration of awareness: Secondary | ICD-10-CM | POA: Diagnosis not present

## 2020-01-14 DIAGNOSIS — M545 Low back pain: Secondary | ICD-10-CM | POA: Diagnosis not present

## 2020-01-14 DIAGNOSIS — M542 Cervicalgia: Secondary | ICD-10-CM | POA: Diagnosis not present

## 2020-01-14 DIAGNOSIS — S30811A Abrasion of abdominal wall, initial encounter: Secondary | ICD-10-CM | POA: Diagnosis present

## 2020-01-14 DIAGNOSIS — S2769XA Other injury of pleura, initial encounter: Secondary | ICD-10-CM | POA: Diagnosis present

## 2020-01-14 DIAGNOSIS — R338 Other retention of urine: Secondary | ICD-10-CM | POA: Diagnosis present

## 2020-01-14 DIAGNOSIS — R52 Pain, unspecified: Secondary | ICD-10-CM

## 2020-01-14 DIAGNOSIS — Z87891 Personal history of nicotine dependence: Secondary | ICD-10-CM

## 2020-01-14 DIAGNOSIS — M549 Dorsalgia, unspecified: Secondary | ICD-10-CM

## 2020-01-14 DIAGNOSIS — N1832 Chronic kidney disease, stage 3b: Secondary | ICD-10-CM | POA: Diagnosis not present

## 2020-01-14 DIAGNOSIS — S32039A Unspecified fracture of third lumbar vertebra, initial encounter for closed fracture: Secondary | ICD-10-CM | POA: Diagnosis present

## 2020-01-14 DIAGNOSIS — Z823 Family history of stroke: Secondary | ICD-10-CM

## 2020-01-14 DIAGNOSIS — Z981 Arthrodesis status: Secondary | ICD-10-CM | POA: Diagnosis not present

## 2020-01-14 DIAGNOSIS — S32029A Unspecified fracture of second lumbar vertebra, initial encounter for closed fracture: Secondary | ICD-10-CM | POA: Diagnosis present

## 2020-01-14 DIAGNOSIS — S22088A Other fracture of T11-T12 vertebra, initial encounter for closed fracture: Secondary | ICD-10-CM | POA: Diagnosis not present

## 2020-01-14 DIAGNOSIS — S32018A Other fracture of first lumbar vertebra, initial encounter for closed fracture: Secondary | ICD-10-CM | POA: Diagnosis not present

## 2020-01-14 DIAGNOSIS — S01312A Laceration without foreign body of left ear, initial encounter: Secondary | ICD-10-CM | POA: Diagnosis present

## 2020-01-14 HISTORY — DX: Chronic kidney disease, stage 3a: N18.31

## 2020-01-14 HISTORY — DX: Benign prostatic hyperplasia without lower urinary tract symptoms: N40.0

## 2020-01-14 HISTORY — DX: Gastro-esophageal reflux disease without esophagitis: K21.9

## 2020-01-14 HISTORY — DX: Mixed hyperlipidemia: E78.2

## 2020-01-14 LAB — URINALYSIS, ROUTINE W REFLEX MICROSCOPIC
Bilirubin Urine: NEGATIVE
Glucose, UA: 50 mg/dL — AB
Ketones, ur: NEGATIVE mg/dL
Nitrite: POSITIVE — AB
Protein, ur: 30 mg/dL — AB
RBC / HPF: >50 RBC/hpf — ABNORMAL HIGH (ref 0–5)
Specific Gravity, Urine: 1.04 — ABNORMAL HIGH (ref 1.005–1.030)
WBC, UA: >50 WBC/hpf — ABNORMAL HIGH (ref 0–5)
pH: 6 (ref 5.0–8.0)

## 2020-01-14 LAB — COMPREHENSIVE METABOLIC PANEL
ALT: 20 U/L (ref 0–44)
AST: 30 U/L (ref 15–41)
Albumin: 3.2 g/dL — ABNORMAL LOW (ref 3.5–5.0)
Alkaline Phosphatase: 138 U/L — ABNORMAL HIGH (ref 38–126)
Anion gap: 10 (ref 5–15)
BUN: 27 mg/dL — ABNORMAL HIGH (ref 8–23)
CO2: 21 mmol/L — ABNORMAL LOW (ref 22–32)
Calcium: 8.8 mg/dL — ABNORMAL LOW (ref 8.9–10.3)
Chloride: 105 mmol/L (ref 98–111)
Creatinine, Ser: 1.9 mg/dL — ABNORMAL HIGH (ref 0.61–1.24)
GFR calc Af Amer: 41 mL/min — ABNORMAL LOW (ref >60)
GFR calc non Af Amer: 36 mL/min — ABNORMAL LOW (ref >60)
Glucose, Bld: 146 mg/dL — ABNORMAL HIGH (ref 70–99)
Potassium: 4.4 mmol/L (ref 3.5–5.1)
Sodium: 136 mmol/L (ref 135–145)
Total Bilirubin: 0.8 mg/dL (ref 0.3–1.2)
Total Protein: 6.7 g/dL (ref 6.5–8.1)

## 2020-01-14 LAB — I-STAT CHEM 8, ED
BUN: 35 mg/dL — ABNORMAL HIGH (ref 8–23)
Calcium, Ion: 1.05 mmol/L — ABNORMAL LOW (ref 1.15–1.40)
Chloride: 107 mmol/L (ref 98–111)
Creatinine, Ser: 1.9 mg/dL — ABNORMAL HIGH (ref 0.61–1.24)
Glucose, Bld: 142 mg/dL — ABNORMAL HIGH (ref 70–99)
HCT: 44 % (ref 39.0–52.0)
Hemoglobin: 15 g/dL (ref 13.0–17.0)
Potassium: 4.4 mmol/L (ref 3.5–5.1)
Sodium: 137 mmol/L (ref 135–145)
TCO2: 24 mmol/L (ref 22–32)

## 2020-01-14 LAB — CBC
HCT: 46.4 % (ref 39.0–52.0)
Hemoglobin: 14.9 g/dL (ref 13.0–17.0)
MCH: 29 pg (ref 26.0–34.0)
MCHC: 32.1 g/dL (ref 30.0–36.0)
MCV: 90.3 fL (ref 80.0–100.0)
Platelets: 204 10*3/uL (ref 150–400)
RBC: 5.14 MIL/uL (ref 4.22–5.81)
RDW: 13.1 % (ref 11.5–15.5)
WBC: 9 10*3/uL (ref 4.0–10.5)
nRBC: 0 % (ref 0.0–0.2)

## 2020-01-14 LAB — HIV ANTIBODY (ROUTINE TESTING W REFLEX): HIV Screen 4th Generation wRfx: NONREACTIVE

## 2020-01-14 LAB — PROTIME-INR
INR: 1 (ref 0.8–1.2)
Prothrombin Time: 12.5 seconds (ref 11.4–15.2)

## 2020-01-14 LAB — TROPONIN I (HIGH SENSITIVITY): Troponin I (High Sensitivity): 4 ng/L (ref <18)

## 2020-01-14 LAB — LACTIC ACID, PLASMA
Lactic Acid, Venous: 2.7 mmol/L (ref 0.5–1.9)
Lactic Acid, Venous: 0.6 mmol/L (ref 0.5–1.9)

## 2020-01-14 LAB — SAMPLE TO BLOOD BANK

## 2020-01-14 LAB — RAPID URINE DRUG SCREEN, HOSP PERFORMED
Amphetamines: NOT DETECTED
Barbiturates: NOT DETECTED
Benzodiazepines: NOT DETECTED
Cocaine: NOT DETECTED
Opiates: POSITIVE — AB
Tetrahydrocannabinol: NOT DETECTED

## 2020-01-14 LAB — CK: Total CK: 441 U/L — ABNORMAL HIGH (ref 49–397)

## 2020-01-14 LAB — SARS CORONAVIRUS 2 BY RT PCR (HOSPITAL ORDER, PERFORMED IN ~~LOC~~ HOSPITAL LAB): SARS Coronavirus 2: NEGATIVE

## 2020-01-14 LAB — ETHANOL: Alcohol, Ethyl (B): <10 mg/dL (ref <10)

## 2020-01-14 LAB — BRAIN NATRIURETIC PEPTIDE: B Natriuretic Peptide: 67.8 pg/mL (ref 0.0–100.0)

## 2020-01-14 MED ORDER — PANTOPRAZOLE SODIUM 40 MG PO TBEC
40.0000 mg | DELAYED_RELEASE_TABLET | Freq: Every day | ORAL | Status: DC
  Administered 2020-01-16 – 2020-01-18 (×3): 40 mg via ORAL
  Filled 2020-01-14 (×4): qty 1

## 2020-01-14 MED ORDER — LACTATED RINGERS IV BOLUS
1000.0000 mL | Freq: Once | INTRAVENOUS | Status: AC
  Administered 2020-01-14: 1000 mL via INTRAVENOUS

## 2020-01-14 MED ORDER — OXYCODONE HCL 5 MG PO TABS
5.0000 mg | ORAL_TABLET | Freq: Four times a day (QID) | ORAL | Status: DC | PRN
  Administered 2020-01-14 – 2020-01-15 (×2): 10 mg via ORAL
  Filled 2020-01-14 (×3): qty 2

## 2020-01-14 MED ORDER — TETANUS-DIPHTH-ACELL PERTUSSIS 5-2.5-18.5 LF-MCG/0.5 IM SUSP: 0.5000 mL | Freq: Once | INTRAMUSCULAR | Status: DC

## 2020-01-14 MED ORDER — SODIUM CHLORIDE 0.9 % IV SOLN
1.0000 g | INTRAVENOUS | Status: DC
  Administered 2020-01-14 – 2020-01-16 (×3): 1 g via INTRAVENOUS
  Filled 2020-01-14 (×2): qty 10
  Filled 2020-01-14: qty 1
  Filled 2020-01-14: qty 10
  Filled 2020-01-14: qty 1

## 2020-01-14 MED ORDER — ONDANSETRON HCL 4 MG/2ML IJ SOLN
4.0000 mg | Freq: Once | INTRAMUSCULAR | Status: AC
  Administered 2020-01-14: 4 mg via INTRAVENOUS
  Filled 2020-01-14: qty 2

## 2020-01-14 MED ORDER — SODIUM CHLORIDE 0.9 % IV BOLUS
1000.0000 mL | Freq: Once | INTRAVENOUS | Status: AC
  Administered 2020-01-14: 1000 mL via INTRAVENOUS

## 2020-01-14 MED ORDER — LACTATED RINGERS IV SOLN: INTRAVENOUS | Status: DC

## 2020-01-14 MED ORDER — HYDROMORPHONE HCL 1 MG/ML IJ SOLN
1.0000 mg | Freq: Once | INTRAMUSCULAR | Status: DC
  Filled 2020-01-14: qty 1

## 2020-01-14 MED ORDER — ACETAMINOPHEN 500 MG PO TABS
1000.0000 mg | ORAL_TABLET | Freq: Four times a day (QID) | ORAL | Status: DC
  Administered 2020-01-14 – 2020-01-18 (×12): 1000 mg via ORAL
  Filled 2020-01-14 (×12): qty 2

## 2020-01-14 MED ORDER — TAMSULOSIN HCL 0.4 MG PO CAPS
0.8000 mg | ORAL_CAPSULE | Freq: Every day | ORAL | Status: DC
  Administered 2020-01-14 – 2020-01-17 (×4): 0.8 mg via ORAL
  Filled 2020-01-14 (×4): qty 2

## 2020-01-14 MED ORDER — IOHEXOL 300 MG/ML  SOLN
80.0000 mL | Freq: Once | INTRAMUSCULAR | Status: AC | PRN
  Administered 2020-01-14: 80 mL via INTRAVENOUS

## 2020-01-14 MED ORDER — ONDANSETRON 4 MG PO TBDP: 4.0000 mg | ORAL_TABLET | Freq: Four times a day (QID) | ORAL | Status: DC | PRN

## 2020-01-14 MED ORDER — HYDROMORPHONE HCL 1 MG/ML IJ SOLN
1.0000 mg | Freq: Once | INTRAMUSCULAR | Status: AC
  Administered 2020-01-14: 1 mg via INTRAVENOUS
  Filled 2020-01-14: qty 1

## 2020-01-14 MED ORDER — HYDROMORPHONE HCL 1 MG/ML IJ SOLN
0.5000 mg | INTRAMUSCULAR | Status: DC | PRN
  Administered 2020-01-15 (×2): 0.5 mg via INTRAVENOUS
  Filled 2020-01-14 (×2): qty 1

## 2020-01-14 MED ORDER — HYDROMORPHONE HCL 1 MG/ML IJ SOLN
INTRAMUSCULAR | Status: AC | PRN
  Administered 2020-01-14 (×2): 1 mg via INTRAVENOUS

## 2020-01-14 MED ORDER — CEFAZOLIN SODIUM-DEXTROSE 2-4 GM/100ML-% IV SOLN
2.0000 g | Freq: Once | INTRAVENOUS | Status: AC
  Administered 2020-01-14: 2 g via INTRAVENOUS

## 2020-01-14 MED ORDER — ONDANSETRON HCL 4 MG/2ML IJ SOLN
4.0000 mg | Freq: Four times a day (QID) | INTRAMUSCULAR | Status: DC | PRN
  Administered 2020-01-15 – 2020-01-17 (×3): 4 mg via INTRAVENOUS
  Filled 2020-01-14 (×3): qty 2

## 2020-01-14 NOTE — Progress Notes (Signed)
Orthopedic Tech Progress Note Patient Details:  Anthony [name] Jan 30, 1951 [id number] Level 2 trauma Patient ID: Anthony [name], male   DOB: 07/03/1951, 69 y.o.   MRN: [id number]   Anthony [name] 01/14/2020, 4:03 PM

## 2020-01-14 NOTE — ED Notes (Signed)
Pt urinated cloudy urine, pt had post voiding residual. Shalhoub, MD made aware
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
## 2020-01-14 NOTE — ED Notes (Addendum)
Pt comfortable  

## 2020-01-14 NOTE — ED Notes (Signed)
ENT at bedside
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
## 2020-01-14 NOTE — ED Notes (Signed)
Difficult to advance catheter past the prostate on several attempts during foley insertion x 2 RN. Coude catheter placed without difficulty. Martyn Malay, MD made aware of the same. Pt with amber cloudy urine output.

## 2020-01-15 ENCOUNTER — Inpatient Hospital Stay (HOSPITAL_COMMUNITY): Payer: Worker's Compensation

## 2020-01-15 DIAGNOSIS — R55 Syncope and collapse: Secondary | ICD-10-CM

## 2020-01-15 DIAGNOSIS — M542 Cervicalgia: Secondary | ICD-10-CM

## 2020-01-15 LAB — HEMOGLOBIN A1C
Hgb A1c MFr Bld: 5.6 % (ref 4.8–5.6)
Mean Plasma Glucose: 114.02 mg/dL

## 2020-01-15 LAB — CBC
HCT: 42 % (ref 39.0–52.0)
Hemoglobin: 13.7 g/dL (ref 13.0–17.0)
MCH: 29.4 pg (ref 26.0–34.0)
MCHC: 32.6 g/dL (ref 30.0–36.0)
MCV: 90.1 fL (ref 80.0–100.0)
Platelets: 148 10*3/uL — ABNORMAL LOW (ref 150–400)
RBC: 4.66 MIL/uL (ref 4.22–5.81)
RDW: 13.4 % (ref 11.5–15.5)
WBC: 9.6 10*3/uL (ref 4.0–10.5)
nRBC: 0 % (ref 0.0–0.2)

## 2020-01-15 LAB — BASIC METABOLIC PANEL
Anion gap: 11 (ref 5–15)
BUN: 24 mg/dL — ABNORMAL HIGH (ref 8–23)
CO2: 21 mmol/L — ABNORMAL LOW (ref 22–32)
Calcium: 8.4 mg/dL — ABNORMAL LOW (ref 8.9–10.3)
Chloride: 106 mmol/L (ref 98–111)
Creatinine, Ser: 1.74 mg/dL — ABNORMAL HIGH (ref 0.61–1.24)
GFR calc Af Amer: 46 mL/min — ABNORMAL LOW (ref >60)
GFR calc non Af Amer: 40 mL/min — ABNORMAL LOW (ref >60)
Glucose, Bld: 115 mg/dL — ABNORMAL HIGH (ref 70–99)
Potassium: 4.2 mmol/L (ref 3.5–5.1)
Sodium: 138 mmol/L (ref 135–145)

## 2020-01-15 LAB — TROPONIN I (HIGH SENSITIVITY)
Troponin I (High Sensitivity): 12 ng/L (ref <18)
Troponin I (High Sensitivity): 10 ng/L (ref <18)

## 2020-01-15 LAB — ECHOCARDIOGRAM COMPLETE
Height: 70 in
Weight: 2800 oz

## 2020-01-15 LAB — LACTIC ACID, PLASMA: Lactic Acid, Venous: 1 mmol/L (ref 0.5–1.9)

## 2020-01-15 LAB — MRSA PCR SCREENING: MRSA by PCR: NEGATIVE

## 2020-01-15 MED ORDER — CHLORHEXIDINE GLUCONATE CLOTH 2 % EX PADS
6.0000 | MEDICATED_PAD | Freq: Every day | CUTANEOUS | Status: DC
  Administered 2020-01-15 – 2020-01-16 (×2): 6 via TOPICAL

## 2020-01-15 MED ORDER — HEPARIN SODIUM (PORCINE) 5000 UNIT/ML IJ SOLN
5000.0000 [IU] | Freq: Three times a day (TID) | INTRAMUSCULAR | Status: DC
  Administered 2020-01-15 – 2020-01-18 (×9): 5000 [IU] via SUBCUTANEOUS
  Filled 2020-01-15 (×9): qty 1

## 2020-01-15 MED ORDER — ENOXAPARIN SODIUM 30 MG/0.3ML ~~LOC~~ SOLN: 30.0000 mg | Freq: Two times a day (BID) | SUBCUTANEOUS | Status: DC

## 2020-01-15 MED ORDER — DIPHENHYDRAMINE HCL 25 MG PO CAPS
25.0000 mg | ORAL_CAPSULE | Freq: Four times a day (QID) | ORAL | Status: DC | PRN
  Administered 2020-01-15 – 2020-01-18 (×5): 25 mg via ORAL
  Filled 2020-01-15 (×5): qty 1

## 2020-01-15 MED ORDER — BACITRACIN ZINC 500 UNIT/GM EX OINT
TOPICAL_OINTMENT | Freq: Two times a day (BID) | CUTANEOUS | Status: DC
  Administered 2020-01-16 – 2020-01-17 (×2): 1 via TOPICAL
  Filled 2020-01-15: qty 28.4
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
## 2020-01-15 NOTE — Progress Notes (Signed)
  Echocardiogram 2D Echocardiogram has been performed.  Gerda Diss 01/15/2020, 11:29 AM
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
## 2020-01-15 NOTE — Progress Notes (Signed)
OT Cancellation Note  Patient Details Name: Anthony [name] MRN: [id number] DOB: 20-Mar-1951   Cancelled Treatment:    Reason Eval/Treat Not Completed: Patient at procedure or test/ unavailable.  Pt going to x-ray.  Will reattempt.  Eber Jones., OTR/L Acute Rehabilitation Services Pager [phone] Office [phone]   Jeani Hawking M 01/15/2020, 8:51 AM

## 2020-01-15 NOTE — Progress Notes (Signed)
Patient arrived to 4NP01 at 2344. CHG bath given, sacral foam placed, and MRSA swab obtained. Pt has scattered abrasions all over his body, including on his legs, arms, and trunk bilaterally. He also has facial abrasions that are wrapped in gauze. RN cleansed and wrapped his left elbow abrasion. Patient is A&Ox4 and in pain. 10mg  of oxycodone administered. Patient has been oriented to the unit. Call bell within reach and bed alarm set.

## 2020-01-15 NOTE — Evaluation (Signed)
Occupational Therapy Evaluation Patient Details Name: Anthony [name] MRN: [id number] DOB: 03/10/1951 Today's Date: 01/15/2020    History of Present Illness Pt is a 69 yo male s/p loss of consciousness, headache, low back pain, L elbow abraision, L rib fxs 8-11, TP fx T11, L1-L3, L facial laceration. Negative imaginng other than  C-spine minimal anterolisthesis C2-3, C3-4 flexion; Minimal anterolisthesis at C4-5 without change. PMHx:ACDF, multiple back sxs, CKD stage III, GERD, BPH, HLD.   Clinical Impression   Pt PTA: pt living alone and independent with ADL, mobility and pt reports working. Pt currently tolerating session well. pt reports minimal pain with transitional movement. Pt requiring set-upA to modA overall for ADL. Pt minguardA with mobility in room using RW for stability. Continue to assess vision throughout ADL as pt limited due to head wrapped and pt wanting to go to bathroom. Pt able to state back precautions and automatically performing log roll. Pt's O2 >98% on RA and HR 85-90 BPM with exertion. BP stable at 148/93 in sitting. No dizziness reproted with positional change. OT following acutely. Hope to progress to no follow-up OT.    Follow Up Recommendations  Home health OT(pt may progress to not require post acute OT)    Equipment Recommendations  None recommended by OT    Recommendations for Other Services       Precautions / Restrictions Precautions Precautions: Fall;Back;Cervical Precaution Comments: went over precautions/log roll Required Braces or Orthoses: Cervical Brace Cervical Brace: Hard collar;Other (comment);At all times(wear at all times until MD says otherwise) Restrictions Weight Bearing Restrictions: No      Mobility Bed Mobility Overal bed mobility: Needs Assistance Bed Mobility: Rolling;Sidelying to Sit Rolling: Supervision Sidelying to sit: Supervision       General bed mobility comments: Use of rail; pt aware of log roll- verbal cue for  log roll  Transfers Overall transfer level: Needs assistance Equipment used: Rolling walker (2 wheeled) Transfers: Sit to/from Stand Sit to Stand: Min guard         General transfer comment: minguardA for initial standing balance to make sure pt found RW with hands    Balance Overall balance assessment: Mild deficits observed, not formally tested                                         ADL either performed or assessed with clinical judgement   ADL Overall ADL's : Needs assistance/impaired Eating/Feeding: Modified independent;Sitting   Grooming: Set up;Sitting   Upper Body Bathing: Set up;Sitting   Lower Body Bathing: Moderate assistance;Cueing for safety;Sitting/lateral leans;Sit to/from stand   Upper Body Dressing : Set up;Sitting   Lower Body Dressing: Moderate assistance;Sitting/lateral leans;Sit to/from stand   Toilet Transfer: Min guard;Cueing for safety;Grab Psychologist, counselling Details (indicate cue type and reason): Pt left on commode; NT and RN made aware as pt wanting to take his time for possible BM stating "this may take a while." Toileting- Clothing Manipulation and Hygiene: Moderate assistance;Sitting/lateral lean;Sit to/from stand       Functional mobility during ADLs: Min guard;Rolling walker General ADL Comments: Pt tolerating session well. pt reports minimal pain with transitional movement. Pt requiring set-upA to modA overall for ADL.       Vision Baseline Vision/History: No visual deficits Patient Visual Report: No change from baseline Vision Assessment?: Vision impaired- to be further tested in functional context;Yes Eye Alignment: Within Functional  Limits Ocular Range of Motion: Within Functional Limits Additional Comments: Continue to assess     Perception     Praxis      Pertinent Vitals/Pain Pain Assessment: 0-10 Pain Score: 2  Pain Location: back with transitional movement Pain Descriptors /  Indicators: Discomfort Pain Intervention(s): Monitored during session;Repositioned;Premedicated before session     Hand Dominance Right   Extremity/Trunk Assessment Upper Extremity Assessment Upper Extremity Assessment: Generalized weakness;LUE deficits/detail;RUE deficits/detail RUE Deficits / Details: IV blew in R antecubittal area- area wrapped; pt slow for shoulder ROM, but WFLs LUE Deficits / Details: L elbow laceration- now wrapped; pt slow for shoulder ROM, but WFLs   Lower Extremity Assessment Lower Extremity Assessment: Generalized weakness   Cervical / Trunk Assessment Cervical / Trunk Assessment: Normal   Communication Communication Communication: No difficulties   Cognition Arousal/Alertness: Awake/alert Behavior During Therapy: WFL for tasks assessed/performed Overall Cognitive Status: Within Functional Limits for tasks assessed                                     General Comments  O2 >98% on RA and HR 85-90 BPM with exertion. BP stable at 148/93 in sitting. No dizziness reproted with positional change.    Exercises     Shoulder Instructions      Home Living Family/patient expects to be discharged to:: Private residence Living Arrangements: Children Available Help at Discharge: Family;Available 24 hours/day Type of Home: House Home Access: Stairs to enter [employer] of Steps: 2 at front; 3 at garage Entrance Stairs-Rails: Left(at garage) Home Layout: Multi-level;Able to live on main level with bedroom/bathroom Alternate Level Stairs-Number of Steps: full flight   Bathroom Shower/Tub: Tub/shower unit;Walk-in shower   Bathroom Toilet: Standard     Home Equipment: Environmental consultant - 2 wheels;Shower seat - built in;Cane - single point   Additional Comments: info from son in room as pt will be living with him      Prior Functioning/Environment Level of Independence: Independent        Comments: working, driving        OT  Problem List: Decreased strength;Decreased activity tolerance;Impaired balance (sitting and/or standing);Pain;Increased edema      OT Treatment/Interventions: Self-care/ADL training;Therapeutic exercise;Energy conservation;DME and/or AE instruction;Therapeutic activities;Patient/family education;Balance training;Visual/perceptual remediation/compensation    OT Goals(Current goals can be found in the care plan section) Acute Rehab OT Goals Patient Stated Goal: to get better and  go to son's home OT Goal Formulation: With patient Time For Goal Achievement: 01/29/20 Potential to Achieve Goals: Good ADL Goals Pt Will Perform Grooming: with modified independence;standing Pt Will Perform Lower Body Dressing: with modified independence;sitting/lateral leans;sit to/from stand Additional ADL Goal #1: Pt will increase to modified independence for ADL tasks with no cues to abide by back/neck precautions.  OT Frequency: Min 2X/week   Barriers to D/C:            Co-evaluation              AM-PAC OT "6 Clicks" Daily Activity     Outcome Measure Help from another person eating meals?: None Help from another person taking care of personal grooming?: None Help from another person toileting, which includes using toliet, bedpan, or urinal?: A Little Help from another person bathing (including washing, rinsing, drying)?: A Little Help from another person to put on and taking off regular upper body clothing?: None Help from another person to put on and  taking off regular lower body clothing?: A Little 6 Click Score: 21   End of Session Equipment Utilized During Treatment: Rolling walker;Cervical collar Nurse Communication: Mobility status;Other (comment)(pt on commode)  Activity Tolerance: Patient tolerated treatment well Patient left: Other (comment)(on commode; RN and NT aware. )  OT Visit Diagnosis: Unsteadiness on feet (R26.81);Muscle weakness (generalized) (M62.81);Pain Pain -  Right/Left: Left Pain - part of body: (ribs)                Time: 1330-1400 OT Time Calculation (min): 30 min Charges:  OT General Charges $OT Visit: 1 Visit OT Evaluation $OT Eval Moderate Complexity: 1 Mod OT Treatments $Self Care/Home Management : 8-22 mins  Flora Lipps, OTR/L Acute Rehabilitation Services Pager: [phone] Office: [phone]   Vash Quezada C 01/15/2020, 3:54 PM
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
## 2020-01-15 NOTE — Progress Notes (Signed)
Unable to complete EEG at this time. Pt has sutures, c collar and head wrap on. We will check on him tomorrow

## 2020-01-16 ENCOUNTER — Inpatient Hospital Stay (HOSPITAL_COMMUNITY): Payer: Worker's Compensation

## 2020-01-16 ENCOUNTER — Other Ambulatory Visit: Payer: Self-pay | Admitting: Cardiology

## 2020-01-16 ENCOUNTER — Other Ambulatory Visit: Payer: Self-pay

## 2020-01-16 DIAGNOSIS — S32018A Other fracture of first lumbar vertebra, initial encounter for closed fracture: Secondary | ICD-10-CM | POA: Diagnosis not present

## 2020-01-16 DIAGNOSIS — S22018A Other fracture of first thoracic vertebra, initial encounter for closed fracture: Secondary | ICD-10-CM | POA: Diagnosis not present

## 2020-01-16 DIAGNOSIS — R55 Syncope and collapse: Secondary | ICD-10-CM | POA: Diagnosis not present

## 2020-01-16 DIAGNOSIS — S2242XD Multiple fractures of ribs, left side, subsequent encounter for fracture with routine healing: Secondary | ICD-10-CM

## 2020-01-16 DIAGNOSIS — R4020 Unspecified coma: Secondary | ICD-10-CM | POA: Diagnosis not present

## 2020-01-16 DIAGNOSIS — S22088A Other fracture of T11-T12 vertebra, initial encounter for closed fracture: Secondary | ICD-10-CM | POA: Diagnosis not present

## 2020-01-16 DIAGNOSIS — S22009A Unspecified fracture of unspecified thoracic vertebra, initial encounter for closed fracture: Secondary | ICD-10-CM

## 2020-01-16 DIAGNOSIS — S32028A Other fracture of second lumbar vertebra, initial encounter for closed fracture: Secondary | ICD-10-CM | POA: Diagnosis not present

## 2020-01-16 DIAGNOSIS — N401 Enlarged prostate with lower urinary tract symptoms: Secondary | ICD-10-CM | POA: Diagnosis not present

## 2020-01-16 DIAGNOSIS — N1330 Unspecified hydronephrosis: Secondary | ICD-10-CM | POA: Diagnosis not present

## 2020-01-16 LAB — BASIC METABOLIC PANEL
Anion gap: 11 (ref 5–15)
BUN: 20 mg/dL (ref 8–23)
CO2: 21 mmol/L — ABNORMAL LOW (ref 22–32)
Calcium: 8.4 mg/dL — ABNORMAL LOW (ref 8.9–10.3)
Chloride: 104 mmol/L (ref 98–111)
Creatinine, Ser: 1.58 mg/dL — ABNORMAL HIGH (ref 0.61–1.24)
GFR calc Af Amer: 52 mL/min — ABNORMAL LOW (ref >60)
GFR calc non Af Amer: 45 mL/min — ABNORMAL LOW (ref >60)
Glucose, Bld: 90 mg/dL (ref 70–99)
Potassium: 4.1 mmol/L (ref 3.5–5.1)
Sodium: 136 mmol/L (ref 135–145)

## 2020-01-16 MED ORDER — OXYCODONE HCL 5 MG PO TABS
5.0000 mg | ORAL_TABLET | ORAL | Status: DC | PRN
  Administered 2020-01-16 – 2020-01-18 (×5): 10 mg via ORAL
  Filled 2020-01-16 (×5): qty 2

## 2020-01-16 MED ORDER — HYDROMORPHONE HCL 1 MG/ML IJ SOLN
0.5000 mg | Freq: Four times a day (QID) | INTRAMUSCULAR | Status: DC | PRN
  Administered 2020-01-17: 0.5 mg via INTRAVENOUS
  Filled 2020-01-16: qty 1

## 2020-01-16 MED ORDER — METHOCARBAMOL 750 MG PO TABS: 750.0000 mg | ORAL_TABLET | Freq: Three times a day (TID) | ORAL | Status: DC

## 2020-01-16 MED ORDER — HYDROMORPHONE HCL 1 MG/ML IJ SOLN: 0.5000 mg | INTRAMUSCULAR | Status: DC | PRN

## 2020-01-16 MED ORDER — METHOCARBAMOL 500 MG PO TABS
1000.0000 mg | ORAL_TABLET | Freq: Three times a day (TID) | ORAL | Status: DC
  Administered 2020-01-16 – 2020-01-18 (×7): 1000 mg via ORAL
  Filled 2020-01-16 (×7): qty 2

## 2020-01-16 MED ORDER — ENSURE ENLIVE PO LIQD
237.0000 mL | Freq: Two times a day (BID) | ORAL | Status: DC
  Administered 2020-01-16 – 2020-01-17 (×3): 237 mL via ORAL

## 2020-01-16 MED ORDER — TRAMADOL HCL 50 MG PO TABS
50.0000 mg | ORAL_TABLET | Freq: Four times a day (QID) | ORAL | Status: DC | PRN
  Administered 2020-01-17: 50 mg via ORAL
  Filled 2020-01-16: qty 1
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
## 2020-01-16 NOTE — Progress Notes (Signed)
EEG completed, results pending. 
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
## 2020-01-16 NOTE — TOC CAGE-AID Note (Signed)
Transition of Care [facility]) - CAGE-AID Screening   Patient Details  Name: Anthony [name] MRN: [id number] Date of Birth: 1951-05-13  Transition of Care [facility]) CM/SW Contact:    Jimmy Picket, [place] Phone Number: 01/16/2020, 1:59 PM   Clinical Narrative:  Pt stated that he has once a daily drinker and now only drinks a few times a week. Pt stated he decided to cut back a few years ago with encouragement from his friends and family. Pt stated he didn't currently have a problem with alcohol and denies substance use.   CAGE-AID Screening:    Have You Ever Felt You Ought to Cut Down on Your Drinking or Drug Use?: Yes Have People Annoyed You By Critizing Your Drinking Or Drug Use?: No Have You Felt Bad Or Guilty About Your Drinking Or Drug Use?: Yes Have You Ever Had a Drink or Used Drugs First Thing In The Morning to STeady Your Nerves or to Get Rid of a Hangover?: No CAGE-AID Score: 2  Substance Abuse Education Offered: Yes  Substance abuse interventions: Patient Counseling     Jimmy Picket, Theresia Majors, [place] Clinical Social Worker [phone]
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
## 2020-01-17 ENCOUNTER — Other Ambulatory Visit (HOSPITAL_COMMUNITY): Payer: Self-pay

## 2020-01-17 ENCOUNTER — Inpatient Hospital Stay (HOSPITAL_COMMUNITY): Payer: Worker's Compensation

## 2020-01-17 DIAGNOSIS — R4020 Unspecified coma: Secondary | ICD-10-CM

## 2020-01-17 LAB — BASIC METABOLIC PANEL
Anion gap: 10 (ref 5–15)
BUN: 19 mg/dL (ref 8–23)
CO2: 22 mmol/L (ref 22–32)
Calcium: 8.9 mg/dL (ref 8.9–10.3)
Chloride: 104 mmol/L (ref 98–111)
Creatinine, Ser: 1.58 mg/dL — ABNORMAL HIGH (ref 0.61–1.24)
GFR calc Af Amer: 52 mL/min — ABNORMAL LOW (ref >60)
GFR calc non Af Amer: 45 mL/min — ABNORMAL LOW (ref >60)
Glucose, Bld: 123 mg/dL — ABNORMAL HIGH (ref 70–99)
Potassium: 4.1 mmol/L (ref 3.5–5.1)
Sodium: 136 mmol/L (ref 135–145)

## 2020-01-17 LAB — CBC WITH DIFFERENTIAL/PLATELET
Abs Immature Granulocytes: 0.02 10*3/uL (ref 0.00–0.07)
Basophils Absolute: 0.1 10*3/uL (ref 0.0–0.1)
Basophils Relative: 2 %
Eosinophils Absolute: 0.7 10*3/uL — ABNORMAL HIGH (ref 0.0–0.5)
Eosinophils Relative: 9 %
HCT: 44.1 % (ref 39.0–52.0)
Hemoglobin: 14.3 g/dL (ref 13.0–17.0)
Immature Granulocytes: 0 %
Lymphocytes Relative: 24 %
Lymphs Abs: 1.8 10*3/uL (ref 0.7–4.0)
MCH: 28.7 pg (ref 26.0–34.0)
MCHC: 32.4 g/dL (ref 30.0–36.0)
MCV: 88.4 fL (ref 80.0–100.0)
Monocytes Absolute: 0.7 10*3/uL (ref 0.1–1.0)
Monocytes Relative: 9 %
Neutro Abs: 4.3 10*3/uL (ref 1.7–7.7)
Neutrophils Relative %: 56 %
Platelets: 134 10*3/uL — ABNORMAL LOW (ref 150–400)
RBC: 4.99 MIL/uL (ref 4.22–5.81)
RDW: 13.3 % (ref 11.5–15.5)
WBC: 7.6 10*3/uL (ref 4.0–10.5)
nRBC: 0 % (ref 0.0–0.2)

## 2020-01-17 LAB — URINE CULTURE: Culture: <10000 — AB

## 2020-01-17 LAB — GLUCOSE, CAPILLARY: Glucose-Capillary: 102 mg/dL — ABNORMAL HIGH (ref 70–99)

## 2020-01-17 MED ORDER — PROMETHAZINE HCL 25 MG/ML IJ SOLN
12.5000 mg | Freq: Once | INTRAMUSCULAR | Status: AC
  Administered 2020-01-17: 12.5 mg via INTRAVENOUS
  Filled 2020-01-17: qty 1

## 2020-01-17 MED ORDER — GADOBUTROL 1 MMOL/ML IV SOLN
8.0000 mL | Freq: Once | INTRAVENOUS | Status: AC | PRN
  Administered 2020-01-17: 8 mL via INTRAVENOUS

## 2020-01-17 MED ORDER — ACETAMINOPHEN 500 MG PO TABS: 1000.0000 mg | ORAL_TABLET | Freq: Three times a day (TID) | ORAL | 0 refills | Status: DC | PRN

## 2020-01-17 MED ORDER — ONDANSETRON 4 MG PO TBDP: 4.0000 mg | ORAL_TABLET | Freq: Four times a day (QID) | ORAL | 0 refills | Status: DC | PRN

## 2020-01-17 MED ORDER — METHOCARBAMOL 500 MG PO TABS: 1000.0000 mg | ORAL_TABLET | Freq: Three times a day (TID) | ORAL | 0 refills | Status: DC | PRN

## 2020-01-17 MED ORDER — CEPHALEXIN 500 MG PO CAPS
500.0000 mg | ORAL_CAPSULE | Freq: Three times a day (TID) | ORAL | Status: DC
  Administered 2020-01-17 – 2020-01-18 (×3): 500 mg via ORAL
  Filled 2020-01-17 (×3): qty 1

## 2020-01-17 MED ORDER — CEPHALEXIN 500 MG PO CAPS: 500.0000 mg | ORAL_CAPSULE | Freq: Three times a day (TID) | ORAL | 0 refills | Status: AC

## 2020-01-17 MED ORDER — OXYCODONE HCL 5 MG PO TABS: 5.0000 mg | ORAL_TABLET | Freq: Four times a day (QID) | ORAL | 0 refills | Status: DC | PRN

## 2020-01-17 MED ORDER — BACITRACIN ZINC 500 UNIT/GM EX OINT: TOPICAL_OINTMENT | Freq: Two times a day (BID) | CUTANEOUS | 0 refills | Status: DC
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
## 2020-01-17 NOTE — Progress Notes (Signed)
Notified by [facility] they would like to obtain Neurology consult and MRI. I have held patients discharge and informed nursing staff of this.
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
## 2020-01-17 NOTE — Progress Notes (Signed)
Patient off unit to MRI via bed.
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
## 2020-01-17 NOTE — Progress Notes (Signed)
Pt came back from MRI nauseous and vomitting. Zofran IV was already given before he went to MRI so I paged Trauma. Received vebal order for Phenergan 12.5mg  IV one time by Dwain Sarna MD. Order placed.

## 2020-01-17 NOTE — Progress Notes (Signed)
Physical Therapy Treatment Patient Details Name: Anthony [name] MRN: [id number] DOB: [birth year] Today's Date: 01/17/2020    History of Present Illness Pt is a 69 yo male s/p loss of consciousness, headache, low back pain, L elbow abraision, L rib fxs 8-11, TP fx T11, L1-L3, L facial laceration. Negative imaginng other than  C-spine minimal anterolisthesis C2-3, C3-4 flexion; Minimal anterolisthesis at C4-5 without change. PMHx:ACDF, multiple back sxs, CKD stage III, GERD, BPH, HLD.    PT Comments    Still painful with mobility, but still mobilizing well and today without RW.  Slower pace, but appropriate for pain.  Patient able to demonstrate appropriate mobility with precautions due to fractures.  Feel stable for home from mobility standpoint.  PT to follow.    Follow Up Recommendations  Home health PT     Equipment Recommendations  None recommended by PT    Recommendations for Other Services       Precautions / Restrictions Precautions Precautions: Fall;Back    Mobility  Bed Mobility Overal bed mobility: Modified Independent             General bed mobility comments: increased time due to pain  Transfers Overall transfer level: Needs assistance Equipment used: None Transfers: Sit to/from Stand Sit to Stand: Supervision         General transfer comment: assist for lines  Ambulation/Gait Ambulation/Gait assistance: Supervision;Min guard Gait Distance (Feet): 400 Feet Assistive device: None Gait Pattern/deviations: Step-through pattern;Decreased stride length;Drifts right/left     General Gait Details: slower pace without walker, but no imbalance noted, reports pain is worse today in ribs   Stairs             Wheelchair Mobility    Modified Rankin (Stroke Patients Only)       Balance Overall balance assessment: Needs assistance   Sitting balance-Leahy Scale: Good     Standing balance support: No upper extremity supported Standing  balance-Leahy Scale: Good                              Cognition Arousal/Alertness: Awake/alert Behavior During Therapy: WFL for tasks assessed/performed Overall Cognitive Status: Within Functional Limits for tasks assessed                                        Exercises      General Comments General comments (skin integrity, edema, etc.): VSS, reviewed precautions and pt reports and demonstrates appropriately      Pertinent Vitals/Pain Faces Pain Scale: Hurts whole lot Pain Location: L ribs in back Pain Descriptors / Indicators: Grimacing;Aching Pain Intervention(s): Monitored during session;Repositioned;Other (comment)(splinted with pillow)    Home Living                      Prior Function            PT Goals (current goals can now be found in the care plan section) Progress towards PT goals: Progressing toward goals    Frequency    Min 4X/week      PT Plan Current plan remains appropriate    Co-evaluation              AM-PAC PT "6 Clicks" Mobility   Outcome Measure  Help needed turning from your back to your side while in a flat bed without using  bedrails?: None Help needed moving from lying on your back to sitting on the side of a flat bed without using bedrails?: None Help needed moving to and from a bed to a chair (including a wheelchair)?: None Help needed standing up from a chair using your arms (e.g., wheelchair or bedside chair)?: A Little Help needed to walk in hospital room?: A Little Help needed climbing 3-5 steps with a railing? : A Little 6 Click Score: 21    End of Session   Activity Tolerance: Patient limited by pain Patient left: in bed;with call bell/phone within reach   PT Visit Diagnosis: Other abnormalities of gait and mobility (R26.89);Pain Pain - Right/Left: Left Pain - part of body: (ribs)     Time: 5271-2929 PT Time Calculation (min) (ACUTE ONLY): 18 min  Charges:  $Gait  Training: 8-22 mins                     Sheran Lawless, PT Acute Rehabilitation Services [phone] 01/17/2020    Anthony [name] 01/17/2020, 5:07 PM

## 2020-01-17 NOTE — TOC Transition Note (Signed)
Transition of Care [facility]) - CM/SW Discharge Note   Patient Details  Name: Anthony [name] MRN: [id number] Date of Birth: 13-Oct-1950  Transition of Care [facility]) CM/SW Contact:  Glennon Mac, RN Phone Number: 01/17/2020, 2:46 PM   Clinical Narrative:  Pt for discharge next 24-48h; planning to dc to son's home, per pt,for several weeks. PT/OT recommending HH follow up; worker's compensation adjustor has been faxed HH orders and will be arranging [facility] services.  Pt notified of this and has information to call adjustor if he does not hear from [facility] agency within 1-2 days of discharge.       Final next level of care: Home w Home Health Services Barriers to Discharge: Barriers Resolved   Patient Goals and CMS Choice Patient states their goals for this hospitalization and ongoing recovery are:: to get back home CMS Medicare.gov Compare Post Acute Care list provided to:: Patient Choice offered to / list presented to : Patient  Discharge Placement                       Discharge Plan and Services   Discharge Planning Services: CM Consult, Other - See comment(workers comp) Post Acute Care Choice: Home Health                    HH Arranged: PT, OT          Social Determinants of Health (SDOH) Interventions     Readmission Risk Interventions No flowsheet data found.  Quintella Baton, RN, BSN  Trauma/Neuro ICU Case Manager [phone]

## 2020-01-18 ENCOUNTER — Encounter (HOSPITAL_COMMUNITY): Payer: Self-pay | Admitting: Emergency Medicine

## 2020-01-18 NOTE — Discharge Instructions (Signed)
After you syncopal episode we are recommending no driving for six months. Use caution when using heavy equipment or power tools. Avoid working on ladders or at heights. Take showers instead of baths. Ensure the water temperature is not too high on the home water heater. Do not go swimming alone. When caring for infants or small children, sit down when holding, feeding, or changing them to minimize risk of injury to the child in the event you have a seizure.  RIB FRACTURES  HOME INSTRUCTIONS   1. PAIN CONTROL:  1. Pain is best controlled by a usual combination of three different methods TOGETHER:  i. Ice/Heat ii. Over the counter pain medication iii. Prescription pain medication 2. You may experience some swelling and bruising in area of broken ribs. Ice packs or heating pads (30-60 minutes up to 6 times a day) will help. Use ice for the first few days to help decrease swelling and bruising, then switch to heat to help relax tight/sore spots and speed recovery. Some people prefer to use ice alone, heat alone, alternating between ice & heat. Experiment to what works for you. Swelling and bruising can take several weeks to resolve.  3. It is helpful to take an over-the-counter pain medication regularly for the first few weeks. Choose one of the following that works best for you:  i. Naproxen (Aleve, etc) Two 220mg  tabs twice a day ii. Ibuprofen (Advil, etc) Three 200mg  tabs four times a day (every meal & bedtime) iii. Acetaminophen (Tylenol, etc) 500-650mg  four times a day (every meal & bedtime) 4. A prescription for pain medication (such as oxycodone, hydrocodone, etc) may be given to you upon discharge. Take your pain medication as prescribed.  i. If you are having problems/concerns with the prescription medicine (does not control pain, nausea, vomiting, rash, itching, etc), please call us [phone] to see if we need to switch you to a different pain medicine that will work better for you and/or  control your side effect better. ii. If you need a refill on your pain medication, please contact your pharmacy. They will contact our office to request authorization. Prescriptions will not be filled after 5 pm or on week-ends. 1. Avoid getting constipated. When taking pain medications, it is common to experience some constipation. Increasing fluid intake and taking a fiber supplement (such as Metamucil, Citrucel, FiberCon, MiraLax, etc) 1-2 times a day regularly will usually help prevent this problem from occurring. A mild laxative (prune juice, Milk of Magnesia, MiraLax, etc) should be taken according to package directions if there are no bowel movements after 48 hours.  2. Watch out for diarrhea. If you have many loose bowel movements, simplify your diet to bland foods & liquids for a few days. Stop any stool softeners and decrease your fiber supplement. Switching to mild anti-diarrheal medications (Kayopectate, Pepto Bismol) can help. If this worsens or does not improve, please call us. 3. FOLLOW UP  a. If a follow up appointment is needed one will be scheduled for you. If none is needed with our trauma team, please follow up with your primary care provider within 2-3 weeks from discharge. Please call [facility] at [phone] if you have any questions about follow up.  b. If you have any orthopedic or other injuries you will need to follow up as outlined in your follow up instructions.   WHEN TO CALL US [phone]:  1. Poor pain control 2. Reactions / problems with new medications (rash/itching, nausea, etc)  3.  Fever over 101.5 F (38.5 C) 4. Worsening swelling or bruising 5. Worsening pain, productive cough, difficulty breathing or any other concerning symptoms  The clinic staff is available to answer your questions during regular business hours (8:30am-5pm). Please don't hesitate to call and ask to speak to one of our nurses for clinical concerns.  If you have a medical emergency, go to the  nearest emergency room or call 911.  A surgeon from [facility] is always on call at the [facility], [place]  [address], [address], [place], [place] [address] ?  MAIN: [phone] ? TOLL FREE: [phone] ?  FAX [phone]  [email or web address]      Information on Rib Fractures  A rib fracture is a break or crack in one of the bones of the ribs. The ribs are long, curved bones that wrap around your chest and attach to your spine and your breastbone. The ribs protect your heart, lungs, and other organs in the chest. A broken or cracked rib is often painful but is not usually serious. Most rib fractures heal on their own over time. However, rib fractures can be more serious if multiple ribs are broken or if broken ribs move out of place and push against other structures or organs. What are the causes? This condition is caused by:  Repetitive movements with high force, such as pitching a baseball or having severe coughing spells.  A direct blow to the chest, such as a sports injury, a car accident, or a fall.  Cancer that has spread to the bones, which can weaken bones and cause them to break. What are the signs or symptoms? Symptoms of this condition include:  Pain when you breathe in or cough.  Pain when someone presses on the injured area.  Feeling short of breath. How is this diagnosed? This condition is diagnosed with a physical exam and medical history. Imaging tests may also be done, such as:  Chest X-ray.  CT scan.  MRI.  Bone scan.  Chest ultrasound. How is this treated? Treatment for this condition depends on the severity of the fracture. Most rib fractures usually heal on their own in 1-3 months. Sometimes healing takes longer if there is a cough that does not stop or if there are other activities that make the injury worse (aggravating factors). While you heal, you will be given medicines to control  the pain. You will also be taught deep breathing exercises. Severe injuries may require hospitalization or surgery. Follow these instructions at home: Managing pain, stiffness, and swelling  If directed, apply ice to the injured area. ? Put ice in a plastic bag. ? Place a towel between your skin and the bag. ? Leave the ice on for 20 minutes, 2-3 times a day.  Take over-the-counter and prescription medicines only as told by your health care provider. Activity  Avoid a lot of activity and any activities or movements that cause pain. Be careful during activities and avoid bumping the injured rib.  Slowly increase your activity as told by your health care provider. General instructions  Do deep breathing exercises as told by your health care provider. This helps prevent pneumonia, which is a common complication of a broken rib. Your health care provider may instruct you to: ? Take deep breaths several times a day. ? Try to cough several times a day, holding a pillow against the injured area. ? Use a device called incentive spirometer to practice  deep breathing several times a day.  Drink enough fluid to keep your urine pale yellow.  Do not wear a rib belt or binder. These restrict breathing, which can lead to pneumonia.  Keep all follow-up visits as told by your health care provider. This is important. Contact a health care provider if:  You have a fever. Get help right away if:  You have difficulty breathing or you are short of breath.  You develop a cough that does not stop, or you cough up thick or bloody sputum.  You have nausea, vomiting, or pain in your abdomen.  Your pain gets worse and medicine does not help. Summary  A rib fracture is a break or crack in one of the bones of the ribs.  A broken or cracked rib is often painful but is not usually serious.  Most rib fractures heal on their own over time.  Treatment for this condition depends on the severity of the  fracture.  Avoid a lot of activity and any activities or movements that cause pain. This information is not intended to replace advice given to you by your health care provider. Make sure you discuss any questions you have with your health care provider. Document Released: 08/09/2005 Document Revised: 11/08/2016 Document Reviewed: 11/08/2016 Elsevier Interactive Patient Education  2019 [employer]   Transverse Process Fracture  Bones of the spine (vertebrae) have portions that extend off to either side of the spine. These portions of bone are called transverse processes. A transverse process fracture, which is also called a rotation spine fracture, is a break in a transverse process. What are the causes? This condition may be caused by:  A fall from a great height.  A car accident.  A sports injury.  A gunshot wound.  A hard, direct hit to the back. This kind of fracture often results from a sudden and severe bending of the spine to one side. Depending on the cause of the fracture, one or more bones may be affected. What increases the risk? You are more likely to develop this condition if:  You have thinning and loss of density in the bones (osteoporosis).  You play a contact sport. What are the signs or symptoms? The main symptom of this condition is back pain. The pain may:  Be felt on the side of the spine (flank) where the fracture is.  Get worse when you move or take a deep breath. How is this diagnosed? This condition may be diagnosed based on:  Your symptoms.  Your medical history.  A physical exam. You may also have other tests, including:  X-rays.  A CT scan.  MRI. How is this treated? Most transverse process fractures heal on their own with time and rest. Treatment may involve supportive care, such as:  Limiting activity.  Medicines, such as: ? Pain medicine. ? Muscle-relaxing medicine.  Physical therapy.  A neck or back brace. Follow these  instructions at home: If you have a brace:  Wear the neck or back brace as told by your health care provider. Remove it only as told by your health care provider.  Keep the brace clean.  If the brace is not waterproof: ? Do not let it get wet. ? Cover it with a watertight covering when you take a bath or a shower. Managing pain, stiffness, and swelling   If directed, put ice on the injured area: ? If you have a removable brace, remove it as told by your health care  provider. ? Put ice in a plastic bag. ? Place a towel between your skin and the bag. ? Leave the ice on for 20 minutes, 2-3 times a day. Medicines  Take over-the-counter and prescription medicines only as told by your health care provider.  Do not drive or use heavy machinery while taking prescription pain medicine.  If you are taking prescription pain medicine, take actions to prevent or treat constipation. Your health care provider may recommend that you: ? Drink enough fluid to keep your urine pale yellow. ? Eat foods that are high in fiber, such as fresh fruits and vegetables, whole grains, and beans. ? Limit foods that are high in fat and processed sugars, such as fried or sweet foods. ? Take an over-the-counter or prescription medicine for constipation. Activity  Stay in bed (on bed rest) only as directed by your health care provider. ? Avoid being in bed for a long time without moving. Get up to take short walks every 1-2 hours. This is important to improve blood flow and breathing. Ask for help if you feel weak or unsteady.  Return to your normal activities when your health care provider says it is okay. Ask if there are any activities that you should not do.  Do physical therapy exercises as recommended by your health care provider. General instructions  Do not use any products that contain nicotine or tobacco, such as cigarettes and e-cigarettes. These can delay bone healing. If you need help quitting, ask  your health care provider.  Keep all follow-up visits as told by your health care provider. This is important. Visits can help to prevent permanent injury, disability, and long-lasting (chronic) pain. Contact a health care provider if:  You have a fever.  You develop a cough that makes your pain worse.  Your pain medicine is not helping.  Your pain does not get better over time.  You cannot return to your normal activities as planned or expected. Get help right away if:  Your pain is very bad and it suddenly gets worse.  You are unable to move any body part (paralysis) that is below the level of your injury.  You have numbness, tingling, or weakness in any body part that is below the level of your injury.  You cannot control your bladder or bowels. Summary  A transverse process fracture is a break in the portion of the bone that extends to the side of the spine.  Most transverse process fractures heal on their own with time and rest.  You may also have supportive treatments such as a back brace, pain medicines, and physical therapy.  Keep all follow-up visits. This is important and will help to prevent permanent injury, disability, and long-lasting (chronic) pain. This information is not intended to replace advice given to you by your health care provider. Make sure you discuss any questions you have with your health care provider. Document Revised: 09/21/2017 Document Reviewed: 09/21/2017 Elsevier Patient Education  2020 [employer].   Urinary Tract Infection, Adult A urinary tract infection (UTI) is an infection of any part of the urinary tract. The urinary tract includes:  The kidneys.  The ureters.  The bladder.  The urethra. These organs make, store, and get rid of pee (urine) in the body. What are the causes? This is caused by germs (bacteria) in your genital area. These germs grow and cause swelling (inflammation) of your urinary tract. What increases the  risk? You are more likely to develop this  condition if:  You have a small, thin tube (catheter) to drain pee.  You cannot control when you pee or poop (incontinence).  You are male, and: ? You use these methods to prevent pregnancy:  A medicine that kills sperm (spermicide).  A device that blocks sperm (diaphragm). ? You have low levels of a male hormone (estrogen). ? You are pregnant.  You have genes that add to your risk.  You are sexually active.  You take antibiotic medicines.  You have trouble peeing because of: ? A prostate that is bigger than normal, if you are male. ? A blockage in the part of your body that drains pee from the bladder (urethra). ? A kidney stone. ? A nerve condition that affects your bladder (neurogenic bladder). ? Not getting enough to drink. ? Not peeing often enough.  You have other conditions, such as: ? Diabetes. ? A weak disease-fighting system (immune system). ? Sickle cell disease. ? Gout. ? Injury of the spine. What are the signs or symptoms? Symptoms of this condition include:  Needing to pee right away (urgently).  Peeing often.  Peeing small amounts often.  Pain or burning when peeing.  Blood in the pee.  Pee that smells bad or not like normal.  Trouble peeing.  Pee that is cloudy.  Fluid coming from the vagina, if you are male.  Pain in the belly or lower back. Other symptoms include:  Throwing up (vomiting).  No urge to eat.  Feeling mixed up (confused).  Being tired and grouchy (irritable).  A fever.  Watery poop (diarrhea). How is this treated? This condition may be treated with:  Antibiotic medicine.  Other medicines.  Drinking enough water. Follow these instructions at home:  Medicines  Take over-the-counter and prescription medicines only as told by your doctor.  If you were prescribed an antibiotic medicine, take it as told by your doctor. Do not stop taking it even if you start to  feel better. General instructions  Make sure you: ? Pee until your bladder is empty. ? Do not hold pee for a long time. ? Empty your bladder after sex. ? Wipe from front to back after pooping if you are a male. Use each tissue one time when you wipe.  Drink enough fluid to keep your pee pale yellow.  Keep all follow-up visits as told by your doctor. This is important. Contact a doctor if:  You do not get better after 1-2 days.  Your symptoms go away and then come back. Get help right away if:  You have very bad back pain.  You have very bad pain in your lower belly.  You have a fever.  You are sick to your stomach (nauseous).  You are throwing up. Summary  A urinary tract infection (UTI) is an infection of any part of the urinary tract.  This condition is caused by germs in your genital area.  There are many risk factors for a UTI. These include having a small, thin tube to drain pee and not being able to control when you pee or poop.  Treatment includes antibiotic medicines for germs.  Drink enough fluid to keep your pee pale yellow. This information is not intended to replace advice given to you by your health care provider. Make sure you discuss any questions you have with your health care provider. Document Revised: 07/27/2018 Document Reviewed: 02/16/2018 Elsevier Patient Education  2020 [employer] Inc.   Wound Care, Adult Taking care of your wound  properly can help to prevent pain, infection, and scarring. It can also help your wound to heal more quickly. How to care for your wound Wound care      Follow instructions from your health care provider about how to take care of your wound. Make sure you: ? Wash your hands with soap and water before you change the bandage (dressing). If soap and water are not available, use hand sanitizer. ? Change your dressing as told by your health care provider. ? Leave stitches (sutures), skin glue, or adhesive strips in  place. These skin closures may need to stay in place for 2 weeks or longer. If adhesive strip edges start to loosen and curl up, you may trim the loose edges. Do not remove adhesive strips completely unless your health care provider tells you to do that.  Check your wound area every day for signs of infection. Check for: ? Redness, swelling, or pain. ? Fluid or blood. ? Warmth. ? Pus or a bad smell.  Ask your health care provider if you should clean the wound with mild soap and water. Doing this may include: ? Using a clean towel to pat the wound dry after cleaning it. Do not rub or scrub the wound. ? Applying a cream or ointment. Do this only as told by your health care provider. ? Covering the incision with a clean dressing.  Ask your health care provider when you can leave the wound uncovered.  Keep the dressing dry until your health care provider says it can be removed. Do not take baths, swim, use a hot tub, or do anything that would put the wound underwater until your health care provider approves. Ask your health care provider if you can take showers. You may only be allowed to take sponge baths. Medicines   If you were prescribed an antibiotic medicine, cream, or ointment, take or use the antibiotic as told by your health care provider. Do not stop taking or using the antibiotic even if your condition improves.  Take over-the-counter and prescription medicines only as told by your health care provider. If you were prescribed pain medicine, take it 30 or more minutes before you do any wound care or as told by your health care provider. General instructions  Return to your normal activities as told by your health care provider. Ask your health care provider what activities are safe.  Do not scratch or pick at the wound.  Do not use any products that contain nicotine or tobacco, such as cigarettes and e-cigarettes. These may delay wound healing. If you need help quitting, ask your  health care provider.  Keep all follow-up visits as told by your health care provider. This is important.  Eat a diet that includes protein, vitamin A, vitamin C, and other nutrient-rich foods to help the wound heal. ? Foods rich in protein include meat, dairy, beans, nuts, and other sources. ? Foods rich in vitamin A include carrots and dark green, leafy vegetables. ? Foods rich in vitamin C include citrus, tomatoes, and other fruits and vegetables. ? Nutrient-rich foods have protein, carbohydrates, fat, vitamins, or minerals. Eat a variety of healthy foods including vegetables, fruits, and whole grains. Contact a health care provider if:  You received a tetanus shot and you have swelling, severe pain, redness, or bleeding at the injection site.  Your pain is not controlled with medicine.  You have redness, swelling, or pain around the wound.  You have fluid or blood coming from  the wound.  Your wound feels warm to the touch.  You have pus or a bad smell coming from the wound.  You have a fever or chills.  You are nauseous or you vomit.  You are dizzy. Get help right away if:  You have a red streak going away from your wound.  The edges of the wound open up and separate.  Your wound is bleeding, and the bleeding does not stop with gentle pressure.  You have a rash.  You faint.  You have trouble breathing. Summary  Always wash your hands with soap and water before changing your bandage (dressing).  To help with healing, eat foods that are rich in protein, vitamin A, vitamin C, and other nutrients.  Check your wound every day for signs of infection. Contact your health care provider if you suspect that your wound is infected. This information is not intended to replace advice given to you by your health care provider. Make sure you discuss any questions you have with your health care provider. Document Revised: 11/27/2018 Document Reviewed: 02/24/2016 Elsevier Patient  Education  2020 [employer].

## 2020-01-18 NOTE — Progress Notes (Signed)
MRI brain negative for any focal lesion. Normal MRI of brain. No further recommendations other than already mentioned in Consult note.

## 2020-01-18 NOTE — Plan of Care (Signed)
  Problem: Health Behavior/Discharge Planning: Goal: Ability to manage health-related needs will improve Note: Received discharge order for patient. Discharge instructions reviewed with patient and his son, including new medication/ prescriptions, activity, follow-up appointment, wound care, and any special instructions. At this time patient have stated understanding of discharge instructions. Patient with no further questions at this time. Pt stable in no acute distress. PIV removed, intact. Pt off unit in wheelchair. Nursing care complete.
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
## 2020-01-24 ENCOUNTER — Encounter (HOSPITAL_COMMUNITY): Payer: Self-pay | Admitting: Emergency Medicine

## 2020-01-24 ENCOUNTER — Other Ambulatory Visit: Payer: Self-pay

## 2020-01-24 ENCOUNTER — Inpatient Hospital Stay (HOSPITAL_COMMUNITY)
Admission: EM | Admit: 2020-01-24 | Discharge: 2020-01-30 | DRG: 871 | Disposition: A | Discharge status: Skilled Nursing Facility | Payer: Worker's Compensation | Attending: Internal Medicine | Admitting: Internal Medicine

## 2020-01-24 ENCOUNTER — Emergency Department (HOSPITAL_COMMUNITY): Payer: Worker's Compensation

## 2020-01-24 DIAGNOSIS — N136 Pyonephrosis: Secondary | ICD-10-CM | POA: Diagnosis present

## 2020-01-24 DIAGNOSIS — N200 Calculus of kidney: Secondary | ICD-10-CM

## 2020-01-24 DIAGNOSIS — I129 Hypertensive chronic kidney disease with stage 1 through stage 4 chronic kidney disease, or unspecified chronic kidney disease: Secondary | ICD-10-CM | POA: Diagnosis present

## 2020-01-24 DIAGNOSIS — Z91011 Allergy to milk products: Secondary | ICD-10-CM

## 2020-01-24 DIAGNOSIS — Z87442 Personal history of urinary calculi: Secondary | ICD-10-CM

## 2020-01-24 DIAGNOSIS — R42 Dizziness and giddiness: Secondary | ICD-10-CM | POA: Diagnosis not present

## 2020-01-24 DIAGNOSIS — Z79899 Other long term (current) drug therapy: Secondary | ICD-10-CM

## 2020-01-24 DIAGNOSIS — I341 Nonrheumatic mitral (valve) prolapse: Secondary | ICD-10-CM | POA: Diagnosis present

## 2020-01-24 DIAGNOSIS — M19041 Primary osteoarthritis, right hand: Secondary | ICD-10-CM | POA: Diagnosis present

## 2020-01-24 DIAGNOSIS — S36039A Unspecified laceration of spleen, initial encounter: Secondary | ICD-10-CM | POA: Diagnosis present

## 2020-01-24 DIAGNOSIS — R000 Tachycardia, unspecified: Secondary | ICD-10-CM | POA: Diagnosis not present

## 2020-01-24 DIAGNOSIS — R52 Pain, unspecified: Secondary | ICD-10-CM | POA: Diagnosis not present

## 2020-01-24 DIAGNOSIS — Z885 Allergy status to narcotic agent status: Secondary | ICD-10-CM

## 2020-01-24 DIAGNOSIS — N1832 Chronic kidney disease, stage 3b: Secondary | ICD-10-CM | POA: Diagnosis present

## 2020-01-24 DIAGNOSIS — N3000 Acute cystitis without hematuria: Secondary | ICD-10-CM

## 2020-01-24 DIAGNOSIS — Z87891 Personal history of nicotine dependence: Secondary | ICD-10-CM

## 2020-01-24 DIAGNOSIS — R338 Other retention of urine: Secondary | ICD-10-CM | POA: Diagnosis present

## 2020-01-24 DIAGNOSIS — R55 Syncope and collapse: Secondary | ICD-10-CM | POA: Diagnosis not present

## 2020-01-24 DIAGNOSIS — S36039S Unspecified laceration of spleen, sequela: Secondary | ICD-10-CM

## 2020-01-24 DIAGNOSIS — I959 Hypotension, unspecified: Secondary | ICD-10-CM

## 2020-01-24 DIAGNOSIS — K219 Gastro-esophageal reflux disease without esophagitis: Secondary | ICD-10-CM | POA: Diagnosis present

## 2020-01-24 DIAGNOSIS — Z20822 Contact with and (suspected) exposure to covid-19: Secondary | ICD-10-CM | POA: Diagnosis present

## 2020-01-24 DIAGNOSIS — J189 Pneumonia, unspecified organism: Secondary | ICD-10-CM | POA: Diagnosis present

## 2020-01-24 DIAGNOSIS — N12 Tubulo-interstitial nephritis, not specified as acute or chronic: Secondary | ICD-10-CM | POA: Diagnosis present

## 2020-01-24 DIAGNOSIS — M19042 Primary osteoarthritis, left hand: Secondary | ICD-10-CM | POA: Diagnosis present

## 2020-01-24 DIAGNOSIS — R627 Adult failure to thrive: Secondary | ICD-10-CM | POA: Diagnosis present

## 2020-01-24 DIAGNOSIS — N401 Enlarged prostate with lower urinary tract symptoms: Secondary | ICD-10-CM | POA: Diagnosis present

## 2020-01-24 DIAGNOSIS — A419 Sepsis, unspecified organism: Principal | ICD-10-CM | POA: Diagnosis present

## 2020-01-24 DIAGNOSIS — J90 Pleural effusion, not elsewhere classified: Secondary | ICD-10-CM | POA: Diagnosis present

## 2020-01-24 DIAGNOSIS — G8929 Other chronic pain: Secondary | ICD-10-CM | POA: Diagnosis present

## 2020-01-24 DIAGNOSIS — J188 Other pneumonia, unspecified organism: Secondary | ICD-10-CM | POA: Diagnosis not present

## 2020-01-24 DIAGNOSIS — Z87440 Personal history of urinary (tract) infections: Secondary | ICD-10-CM

## 2020-01-24 DIAGNOSIS — E738 Other lactose intolerance: Secondary | ICD-10-CM | POA: Diagnosis present

## 2020-01-24 DIAGNOSIS — I213 ST elevation (STEMI) myocardial infarction of unspecified site: Secondary | ICD-10-CM | POA: Diagnosis not present

## 2020-01-24 DIAGNOSIS — E782 Mixed hyperlipidemia: Secondary | ICD-10-CM | POA: Diagnosis present

## 2020-01-24 DIAGNOSIS — Z9049 Acquired absence of other specified parts of digestive tract: Secondary | ICD-10-CM

## 2020-01-24 DIAGNOSIS — S2242XA Multiple fractures of ribs, left side, initial encounter for closed fracture: Secondary | ICD-10-CM | POA: Diagnosis not present

## 2020-01-24 DIAGNOSIS — N3080 Other cystitis without hematuria: Secondary | ICD-10-CM | POA: Diagnosis present

## 2020-01-24 DIAGNOSIS — S2242XD Multiple fractures of ribs, left side, subsequent encounter for fracture with routine healing: Secondary | ICD-10-CM

## 2020-01-24 LAB — CBC WITH DIFFERENTIAL/PLATELET
Abs Immature Granulocytes: 0.11 10*3/uL — ABNORMAL HIGH (ref 0.00–0.07)
Basophils Absolute: 0.1 10*3/uL (ref 0.0–0.1)
Basophils Relative: 1 %
Eosinophils Absolute: 0.2 10*3/uL (ref 0.0–0.5)
Eosinophils Relative: 1 %
HCT: 41.9 % (ref 39.0–52.0)
Hemoglobin: 13.5 g/dL (ref 13.0–17.0)
Immature Granulocytes: 1 %
Lymphocytes Relative: 9 %
Lymphs Abs: 1.2 10*3/uL (ref 0.7–4.0)
MCH: 29.5 pg (ref 26.0–34.0)
MCHC: 32.2 g/dL (ref 30.0–36.0)
MCV: 91.5 fL (ref 80.0–100.0)
Monocytes Absolute: 0.8 10*3/uL (ref 0.1–1.0)
Monocytes Relative: 6 %
Neutro Abs: 11.2 10*3/uL — ABNORMAL HIGH (ref 1.7–7.7)
Neutrophils Relative %: 82 %
Platelets: 204 10*3/uL (ref 150–400)
RBC: 4.58 MIL/uL (ref 4.22–5.81)
RDW: 14 % (ref 11.5–15.5)
WBC: 13.7 10*3/uL — ABNORMAL HIGH (ref 4.0–10.5)
nRBC: 0 % (ref 0.0–0.2)

## 2020-01-24 LAB — URINALYSIS, ROUTINE W REFLEX MICROSCOPIC
Bilirubin Urine: NEGATIVE
Glucose, UA: NEGATIVE mg/dL
Ketones, ur: NEGATIVE mg/dL
Nitrite: POSITIVE — AB
Protein, ur: 30 mg/dL — AB
Specific Gravity, Urine: 1.018 (ref 1.005–1.030)
WBC, UA: >50 WBC/hpf — ABNORMAL HIGH (ref 0–5)
pH: 5 (ref 5.0–8.0)

## 2020-01-24 LAB — CBC
HCT: 31.6 % — ABNORMAL LOW (ref 39.0–52.0)
Hemoglobin: 10.2 g/dL — ABNORMAL LOW (ref 13.0–17.0)
MCH: 28.8 pg (ref 26.0–34.0)
MCHC: 32.3 g/dL (ref 30.0–36.0)
MCV: 89.3 fL (ref 80.0–100.0)
Platelets: 186 10*3/uL (ref 150–400)
RBC: 3.54 MIL/uL — ABNORMAL LOW (ref 4.22–5.81)
RDW: 14.1 % (ref 11.5–15.5)
WBC: 13 10*3/uL — ABNORMAL HIGH (ref 4.0–10.5)
nRBC: 0 % (ref 0.0–0.2)

## 2020-01-24 LAB — PROTIME-INR
INR: 1.2 (ref 0.8–1.2)
Prothrombin Time: 14.3 seconds (ref 11.4–15.2)

## 2020-01-24 LAB — COMPREHENSIVE METABOLIC PANEL
ALT: 38 U/L (ref 0–44)
AST: 36 U/L (ref 15–41)
Albumin: 3.1 g/dL — ABNORMAL LOW (ref 3.5–5.0)
Alkaline Phosphatase: 143 U/L — ABNORMAL HIGH (ref 38–126)
Anion gap: 9 (ref 5–15)
BUN: 32 mg/dL — ABNORMAL HIGH (ref 8–23)
CO2: 22 mmol/L (ref 22–32)
Calcium: 8.4 mg/dL — ABNORMAL LOW (ref 8.9–10.3)
Chloride: 107 mmol/L (ref 98–111)
Creatinine, Ser: 1.92 mg/dL — ABNORMAL HIGH (ref 0.61–1.24)
GFR calc Af Amer: 41 mL/min — ABNORMAL LOW (ref >60)
GFR calc non Af Amer: 35 mL/min — ABNORMAL LOW (ref >60)
Glucose, Bld: 182 mg/dL — ABNORMAL HIGH (ref 70–99)
Potassium: 4.5 mmol/L (ref 3.5–5.1)
Sodium: 138 mmol/L (ref 135–145)
Total Bilirubin: 0.8 mg/dL (ref 0.3–1.2)
Total Protein: 6.4 g/dL — ABNORMAL LOW (ref 6.5–8.1)

## 2020-01-24 LAB — POCT I-STAT, CHEM 8
BUN: 30 mg/dL — ABNORMAL HIGH (ref 8–23)
Calcium, Ion: 1.14 mmol/L — ABNORMAL LOW (ref 1.15–1.40)
Chloride: 106 mmol/L (ref 98–111)
Creatinine, Ser: 1.7 mg/dL — ABNORMAL HIGH (ref 0.61–1.24)
Glucose, Bld: 98 mg/dL (ref 70–99)
HCT: 28 % — ABNORMAL LOW (ref 39.0–52.0)
Hemoglobin: 9.5 g/dL — ABNORMAL LOW (ref 13.0–17.0)
Potassium: 4.6 mmol/L (ref 3.5–5.1)
Sodium: 136 mmol/L (ref 135–145)
TCO2: 22 mmol/L (ref 22–32)

## 2020-01-24 LAB — LACTIC ACID, PLASMA
Lactic Acid, Venous: 2.9 mmol/L (ref 0.5–1.9)
Lactic Acid, Venous: 3 mmol/L (ref 0.5–1.9)
Lactic Acid, Venous: 1.5 mmol/L (ref 0.5–1.9)

## 2020-01-24 LAB — SARS CORONAVIRUS 2 BY RT PCR (HOSPITAL ORDER, PERFORMED IN ~~LOC~~ HOSPITAL LAB): SARS Coronavirus 2: NEGATIVE

## 2020-01-24 LAB — APTT: aPTT: 26 seconds (ref 24–36)

## 2020-01-24 LAB — TYPE AND SCREEN
ABO/RH(D): O NEG
Antibody Screen: NEGATIVE

## 2020-01-24 MED ORDER — LACTATED RINGERS IV BOLUS
1000.0000 mL | Freq: Once | INTRAVENOUS | Status: AC
  Administered 2020-01-24: 1000 mL via INTRAVENOUS

## 2020-01-24 MED ORDER — VANCOMYCIN HCL 1750 MG/350ML IV SOLN
1750.0000 mg | Freq: Once | INTRAVENOUS | Status: AC
  Administered 2020-01-24: 1750 mg via INTRAVENOUS
  Filled 2020-01-24: qty 350

## 2020-01-24 MED ORDER — VANCOMYCIN HCL IN DEXTROSE 1-5 GM/200ML-% IV SOLN
1000.0000 mg | Freq: Once | INTRAVENOUS | Status: DC
  Filled 2020-01-24: qty 200

## 2020-01-24 MED ORDER — VANCOMYCIN HCL IN DEXTROSE 1-5 GM/200ML-% IV SOLN
1000.0000 mg | INTRAVENOUS | Status: DC
  Administered 2020-01-25: 1000 mg via INTRAVENOUS
  Filled 2020-01-24: qty 200

## 2020-01-24 MED ORDER — PROMETHAZINE HCL 25 MG/ML IJ SOLN
12.5000 mg | Freq: Once | INTRAMUSCULAR | Status: AC
  Administered 2020-01-24: 12.5 mg via INTRAVENOUS
  Filled 2020-01-24: qty 1

## 2020-01-24 MED ORDER — VANCOMYCIN HCL 750 MG/150ML IV SOLN
750.0000 mg | Freq: Two times a day (BID) | INTRAVENOUS | Status: DC
  Filled 2020-01-24: qty 150

## 2020-01-24 MED ORDER — LACTATED RINGERS IV BOLUS (SEPSIS)
500.0000 mL | Freq: Once | INTRAVENOUS | Status: AC
  Administered 2020-01-24: 500 mL via INTRAVENOUS

## 2020-01-24 MED ORDER — FENTANYL CITRATE (PF) 100 MCG/2ML IJ SOLN
25.0000 ug | Freq: Once | INTRAMUSCULAR | Status: AC
  Administered 2020-01-24: 25 ug via INTRAVENOUS
  Filled 2020-01-24: qty 2

## 2020-01-24 MED ORDER — SODIUM CHLORIDE 0.9 % IV SOLN
2.0000 g | Freq: Two times a day (BID) | INTRAVENOUS | Status: DC
  Administered 2020-01-24 – 2020-01-26 (×4): 2 g via INTRAVENOUS
  Filled 2020-01-24 (×4): qty 2

## 2020-01-24 MED ORDER — LACTATED RINGERS IV BOLUS (SEPSIS)
1000.0000 mL | Freq: Once | INTRAVENOUS | Status: AC
  Administered 2020-01-24: 1000 mL via INTRAVENOUS

## 2020-01-24 MED ORDER — ACETAMINOPHEN 500 MG PO TABS
1000.0000 mg | ORAL_TABLET | Freq: Once | ORAL | Status: AC
  Administered 2020-01-24: 1000 mg via ORAL
  Filled 2020-01-24: qty 2

## 2020-01-24 MED ORDER — IOHEXOL 350 MG/ML SOLN
80.0000 mL | Freq: Once | INTRAVENOUS | Status: AC | PRN
  Administered 2020-01-24: 80 mL via INTRAVENOUS

## 2020-01-24 NOTE — Sepsis Progress Note (Signed)
Notified provider of need to draw repeat lactic acid since 2nd draw was elevated.

## 2020-01-24 NOTE — ED Notes (Signed)
Dr. Dalene Seltzer aware of pts lactic acid level, pain, temperature, and reported difficulty breathing.

## 2020-01-24 NOTE — ED Notes (Signed)
Pharmacy to send vanc to unit.
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
## 2020-01-24 NOTE — ED Notes (Signed)
Pt states that he needs phenergan for the CT contrast.

## 2020-01-24 NOTE — Progress Notes (Addendum)
Pharmacy Antibiotic Note  Anthony [name] is a 69 y.o. male admitted on 01/24/2020 with pneumonia.  Pharmacy has been consulted for Cefepime and Vancomycin dosing.   Height: 5\' 10"  (177.8 cm) Weight: 79.4 kg (175 lb) IBW/kg (Calculated) : 73  Temp (24hrs), Avg:97.5 F (36.4 C), Min:97.5 F (36.4 C), Max:97.5 F (36.4 C)  Recent Labs  Lab 01/24/20 1522  WBC 13.7*  CREATININE 1.92*  LATICACIDVEN 2.9*    Estimated Creatinine Clearance: 38.5 mL/min (A) (by C-G formula based on SCr of 1.92 mg/dL (H)).    Allergies  Allergen Reactions  . Cheese Other (See Comments)    cramps  . Cheese     Stomach upset , cramping.   . Codeine Itching  . Codeine Itching  . Lactose Intolerance (Gi) Other (See Comments)    GI distress  . Morphine And Related Itching  . Morphine And Related     Insomnia     Antimicrobials this admission: 6/3 Cefepime >>  6/3 Vancomycin >>   Dose adjustments this admission: N/a  Microbiology results: Pending   Plan:  - Cefepime 2g IV q12h - Vancomycin 1750mg  IV x 1 dose  - Followed by Vancomycin 1000mg  IV q24h (nomogram dosing)  - Monitor patients renal function and urine output  - De-escalate ABX when appropriate   Thank you for allowing pharmacy to be a part of this patient's care.  8/3 PharmD. BCPS 01/24/2020 5:07 PM

## 2020-01-25 DIAGNOSIS — Z9049 Acquired absence of other specified parts of digestive tract: Secondary | ICD-10-CM | POA: Diagnosis not present

## 2020-01-25 DIAGNOSIS — N136 Pyonephrosis: Secondary | ICD-10-CM | POA: Diagnosis present

## 2020-01-25 DIAGNOSIS — J9601 Acute respiratory failure with hypoxia: Secondary | ICD-10-CM | POA: Diagnosis not present

## 2020-01-25 DIAGNOSIS — G8929 Other chronic pain: Secondary | ICD-10-CM | POA: Diagnosis present

## 2020-01-25 DIAGNOSIS — R627 Adult failure to thrive: Secondary | ICD-10-CM | POA: Diagnosis present

## 2020-01-25 DIAGNOSIS — Z885 Allergy status to narcotic agent status: Secondary | ICD-10-CM | POA: Diagnosis not present

## 2020-01-25 DIAGNOSIS — N12 Tubulo-interstitial nephritis, not specified as acute or chronic: Secondary | ICD-10-CM | POA: Diagnosis present

## 2020-01-25 DIAGNOSIS — Z87891 Personal history of nicotine dependence: Secondary | ICD-10-CM | POA: Diagnosis not present

## 2020-01-25 DIAGNOSIS — M19041 Primary osteoarthritis, right hand: Secondary | ICD-10-CM | POA: Diagnosis present

## 2020-01-25 DIAGNOSIS — J189 Pneumonia, unspecified organism: Secondary | ICD-10-CM | POA: Diagnosis present

## 2020-01-25 DIAGNOSIS — R55 Syncope and collapse: Secondary | ICD-10-CM | POA: Diagnosis present

## 2020-01-25 DIAGNOSIS — Z20822 Contact with and (suspected) exposure to covid-19: Secondary | ICD-10-CM | POA: Diagnosis present

## 2020-01-25 DIAGNOSIS — K219 Gastro-esophageal reflux disease without esophagitis: Secondary | ICD-10-CM | POA: Diagnosis present

## 2020-01-25 DIAGNOSIS — R6521 Severe sepsis with septic shock: Secondary | ICD-10-CM | POA: Diagnosis not present

## 2020-01-25 DIAGNOSIS — A419 Sepsis, unspecified organism: Secondary | ICD-10-CM | POA: Diagnosis present

## 2020-01-25 DIAGNOSIS — Z7401 Bed confinement status: Secondary | ICD-10-CM | POA: Diagnosis not present

## 2020-01-25 DIAGNOSIS — E738 Other lactose intolerance: Secondary | ICD-10-CM | POA: Diagnosis present

## 2020-01-25 DIAGNOSIS — N1832 Chronic kidney disease, stage 3b: Secondary | ICD-10-CM | POA: Diagnosis present

## 2020-01-25 DIAGNOSIS — R5381 Other malaise: Secondary | ICD-10-CM | POA: Diagnosis not present

## 2020-01-25 DIAGNOSIS — I129 Hypertensive chronic kidney disease with stage 1 through stage 4 chronic kidney disease, or unspecified chronic kidney disease: Secondary | ICD-10-CM | POA: Diagnosis present

## 2020-01-25 DIAGNOSIS — Z87440 Personal history of urinary (tract) infections: Secondary | ICD-10-CM | POA: Diagnosis not present

## 2020-01-25 DIAGNOSIS — N3289 Other specified disorders of bladder: Secondary | ICD-10-CM | POA: Diagnosis not present

## 2020-01-25 DIAGNOSIS — M2550 Pain in unspecified joint: Secondary | ICD-10-CM | POA: Diagnosis not present

## 2020-01-25 DIAGNOSIS — S36039A Unspecified laceration of spleen, initial encounter: Secondary | ICD-10-CM | POA: Diagnosis present

## 2020-01-25 DIAGNOSIS — J90 Pleural effusion, not elsewhere classified: Secondary | ICD-10-CM | POA: Diagnosis present

## 2020-01-25 DIAGNOSIS — E782 Mixed hyperlipidemia: Secondary | ICD-10-CM | POA: Diagnosis present

## 2020-01-25 DIAGNOSIS — N200 Calculus of kidney: Secondary | ICD-10-CM

## 2020-01-25 DIAGNOSIS — N3080 Other cystitis without hematuria: Secondary | ICD-10-CM | POA: Diagnosis present

## 2020-01-25 DIAGNOSIS — R338 Other retention of urine: Secondary | ICD-10-CM | POA: Diagnosis present

## 2020-01-25 DIAGNOSIS — M19042 Primary osteoarthritis, left hand: Secondary | ICD-10-CM | POA: Diagnosis present

## 2020-01-25 DIAGNOSIS — I341 Nonrheumatic mitral (valve) prolapse: Secondary | ICD-10-CM | POA: Diagnosis present

## 2020-01-25 DIAGNOSIS — Z91011 Allergy to milk products: Secondary | ICD-10-CM | POA: Diagnosis not present

## 2020-01-25 DIAGNOSIS — Z79899 Other long term (current) drug therapy: Secondary | ICD-10-CM | POA: Diagnosis not present

## 2020-01-25 DIAGNOSIS — Z87442 Personal history of urinary calculi: Secondary | ICD-10-CM | POA: Diagnosis not present

## 2020-01-25 DIAGNOSIS — N401 Enlarged prostate with lower urinary tract symptoms: Secondary | ICD-10-CM | POA: Diagnosis present

## 2020-01-25 LAB — COMPREHENSIVE METABOLIC PANEL
ALT: 34 U/L (ref 0–44)
AST: 42 U/L — ABNORMAL HIGH (ref 15–41)
Albumin: 2.5 g/dL — ABNORMAL LOW (ref 3.5–5.0)
Alkaline Phosphatase: 111 U/L (ref 38–126)
Anion gap: 8 (ref 5–15)
BUN: 28 mg/dL — ABNORMAL HIGH (ref 8–23)
CO2: 21 mmol/L — ABNORMAL LOW (ref 22–32)
Calcium: 8.1 mg/dL — ABNORMAL LOW (ref 8.9–10.3)
Chloride: 106 mmol/L (ref 98–111)
Creatinine, Ser: 1.66 mg/dL — ABNORMAL HIGH (ref 0.61–1.24)
GFR calc Af Amer: 49 mL/min — ABNORMAL LOW (ref >60)
GFR calc non Af Amer: 42 mL/min — ABNORMAL LOW (ref >60)
Glucose, Bld: 121 mg/dL — ABNORMAL HIGH (ref 70–99)
Potassium: 4.6 mmol/L (ref 3.5–5.1)
Sodium: 135 mmol/L (ref 135–145)
Total Bilirubin: 1.2 mg/dL (ref 0.3–1.2)
Total Protein: 5.4 g/dL — ABNORMAL LOW (ref 6.5–8.1)

## 2020-01-25 LAB — CBC
HCT: 33.2 % — ABNORMAL LOW (ref 39.0–52.0)
Hemoglobin: 10.6 g/dL — ABNORMAL LOW (ref 13.0–17.0)
MCH: 29 pg (ref 26.0–34.0)
MCHC: 31.9 g/dL (ref 30.0–36.0)
MCV: 91 fL (ref 80.0–100.0)
Platelets: 200 10*3/uL (ref 150–400)
RBC: 3.65 MIL/uL — ABNORMAL LOW (ref 4.22–5.81)
RDW: 14.6 % (ref 11.5–15.5)
WBC: 19.1 10*3/uL — ABNORMAL HIGH (ref 4.0–10.5)
nRBC: 0 % (ref 0.0–0.2)

## 2020-01-25 LAB — LACTIC ACID, PLASMA: Lactic Acid, Venous: 1.1 mmol/L (ref 0.5–1.9)

## 2020-01-25 MED ORDER — OXYCODONE HCL 5 MG PO TABS
5.0000 mg | ORAL_TABLET | Freq: Four times a day (QID) | ORAL | Status: DC | PRN
  Administered 2020-01-25 – 2020-01-30 (×11): 5 mg via ORAL
  Filled 2020-01-25 (×11): qty 1

## 2020-01-25 MED ORDER — PANTOPRAZOLE SODIUM 40 MG PO TBEC
40.0000 mg | DELAYED_RELEASE_TABLET | Freq: Every day | ORAL | Status: DC
  Administered 2020-01-25 – 2020-01-30 (×6): 40 mg via ORAL
  Filled 2020-01-25 (×6): qty 1

## 2020-01-25 MED ORDER — ACETAMINOPHEN 500 MG PO TABS
1000.0000 mg | ORAL_TABLET | Freq: Four times a day (QID) | ORAL | Status: DC
  Administered 2020-01-25 – 2020-01-30 (×18): 1000 mg via ORAL
  Filled 2020-01-25 (×20): qty 2

## 2020-01-25 MED ORDER — METHOCARBAMOL 500 MG PO TABS
750.0000 mg | ORAL_TABLET | Freq: Four times a day (QID) | ORAL | Status: DC | PRN
  Administered 2020-01-27 – 2020-01-28 (×2): 750 mg via ORAL
  Filled 2020-01-25 (×2): qty 2

## 2020-01-25 MED ORDER — OXYBUTYNIN CHLORIDE 5 MG PO TABS: 5.0000 mg | ORAL_TABLET | Freq: Three times a day (TID) | ORAL | Status: DC | PRN

## 2020-01-25 MED ORDER — CHLORHEXIDINE GLUCONATE CLOTH 2 % EX PADS
6.0000 | MEDICATED_PAD | Freq: Every day | CUTANEOUS | Status: DC
  Administered 2020-01-25: 6 via TOPICAL

## 2020-01-25 NOTE — ED Notes (Signed)
Breakfast tray ordered 

## 2020-01-25 NOTE — ED Notes (Signed)
Lunch Tray Ordered @ 1048. °
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
## 2020-01-26 DIAGNOSIS — R6521 Severe sepsis with septic shock: Secondary | ICD-10-CM

## 2020-01-26 DIAGNOSIS — J189 Pneumonia, unspecified organism: Secondary | ICD-10-CM

## 2020-01-26 DIAGNOSIS — J9601 Acute respiratory failure with hypoxia: Secondary | ICD-10-CM

## 2020-01-26 LAB — BASIC METABOLIC PANEL
Anion gap: 7 (ref 5–15)
BUN: 26 mg/dL — ABNORMAL HIGH (ref 8–23)
CO2: 24 mmol/L (ref 22–32)
Calcium: 8.4 mg/dL — ABNORMAL LOW (ref 8.9–10.3)
Chloride: 106 mmol/L (ref 98–111)
Creatinine, Ser: 1.49 mg/dL — ABNORMAL HIGH (ref 0.61–1.24)
GFR calc Af Amer: 55 mL/min — ABNORMAL LOW (ref >60)
GFR calc non Af Amer: 48 mL/min — ABNORMAL LOW (ref >60)
Glucose, Bld: 92 mg/dL (ref 70–99)
Potassium: 4 mmol/L (ref 3.5–5.1)
Sodium: 137 mmol/L (ref 135–145)

## 2020-01-26 LAB — CBC
HCT: 31.7 % — ABNORMAL LOW (ref 39.0–52.0)
Hemoglobin: 10.2 g/dL — ABNORMAL LOW (ref 13.0–17.0)
MCH: 29.2 pg (ref 26.0–34.0)
MCHC: 32.2 g/dL (ref 30.0–36.0)
MCV: 90.8 fL (ref 80.0–100.0)
Platelets: 215 10*3/uL (ref 150–400)
RBC: 3.49 MIL/uL — ABNORMAL LOW (ref 4.22–5.81)
RDW: 14.5 % (ref 11.5–15.5)
WBC: 16.9 10*3/uL — ABNORMAL HIGH (ref 4.0–10.5)
nRBC: 0 % (ref 0.0–0.2)

## 2020-01-26 MED ORDER — AZITHROMYCIN 250 MG PO TABS
250.0000 mg | ORAL_TABLET | Freq: Every day | ORAL | Status: DC
  Administered 2020-01-27 – 2020-01-28 (×2): 250 mg via ORAL
  Filled 2020-01-26 (×2): qty 1

## 2020-01-26 MED ORDER — SODIUM CHLORIDE 0.9 % IV SOLN
2.0000 g | INTRAVENOUS | Status: DC
  Administered 2020-01-26: 2 g via INTRAVENOUS
  Filled 2020-01-26: qty 20
  Filled 2020-01-26: qty 2

## 2020-01-26 MED ORDER — PROMETHAZINE HCL 25 MG PO TABS
12.5000 mg | ORAL_TABLET | Freq: Once | ORAL | Status: AC
  Administered 2020-01-26: 12.5 mg via ORAL
  Filled 2020-01-26: qty 1

## 2020-01-26 MED ORDER — POLYETHYLENE GLYCOL 3350 17 G PO PACK
17.0000 g | PACK | Freq: Every day | ORAL | Status: DC
  Filled 2020-01-26 (×4): qty 1

## 2020-01-26 MED ORDER — TAMSULOSIN HCL 0.4 MG PO CAPS
0.4000 mg | ORAL_CAPSULE | Freq: Every day | ORAL | Status: DC
  Administered 2020-01-26 – 2020-01-30 (×5): 0.4 mg via ORAL
  Filled 2020-01-26 (×5): qty 1

## 2020-01-26 MED ORDER — DOCUSATE SODIUM 100 MG PO CAPS
100.0000 mg | ORAL_CAPSULE | Freq: Two times a day (BID) | ORAL | Status: DC
  Administered 2020-01-26: 100 mg via ORAL
  Filled 2020-01-26 (×7): qty 1

## 2020-01-26 MED ORDER — AZITHROMYCIN 250 MG PO TABS
500.0000 mg | ORAL_TABLET | Freq: Every day | ORAL | Status: AC
  Administered 2020-01-26: 500 mg via ORAL
  Filled 2020-01-26: qty 2
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
## 2020-01-26 NOTE — Progress Notes (Signed)
Patient's Foley catheter was removed at approximately 1130 per MD order. Patient has voided x2 since removal; first void was unmeasured as patient voided in commode. Second void was 375 mL; post void residual bladder scan was completed and showed volume of 0 mL.

## 2020-01-27 LAB — CBC
HCT: 30.9 % — ABNORMAL LOW (ref 39.0–52.0)
Hemoglobin: 10.1 g/dL — ABNORMAL LOW (ref 13.0–17.0)
MCH: 29.7 pg (ref 26.0–34.0)
MCHC: 32.7 g/dL (ref 30.0–36.0)
MCV: 90.9 fL (ref 80.0–100.0)
Platelets: 254 10*3/uL (ref 150–400)
RBC: 3.4 MIL/uL — ABNORMAL LOW (ref 4.22–5.81)
RDW: 14.3 % (ref 11.5–15.5)
WBC: 14.8 10*3/uL — ABNORMAL HIGH (ref 4.0–10.5)
nRBC: 0 % (ref 0.0–0.2)

## 2020-01-27 LAB — URINE CULTURE: Culture: 20000 — AB

## 2020-01-27 LAB — BASIC METABOLIC PANEL
Anion gap: 10 (ref 5–15)
BUN: 24 mg/dL — ABNORMAL HIGH (ref 8–23)
CO2: 22 mmol/L (ref 22–32)
Calcium: 8.6 mg/dL — ABNORMAL LOW (ref 8.9–10.3)
Chloride: 104 mmol/L (ref 98–111)
Creatinine, Ser: 1.48 mg/dL — ABNORMAL HIGH (ref 0.61–1.24)
GFR calc Af Amer: 56 mL/min — ABNORMAL LOW (ref >60)
GFR calc non Af Amer: 48 mL/min — ABNORMAL LOW (ref >60)
Glucose, Bld: 86 mg/dL (ref 70–99)
Potassium: 3.9 mmol/L (ref 3.5–5.1)
Sodium: 136 mmol/L (ref 135–145)

## 2020-01-27 MED ORDER — AMOXICILLIN-POT CLAVULANATE 875-125 MG PO TABS
1.0000 | ORAL_TABLET | Freq: Two times a day (BID) | ORAL | Status: DC
  Administered 2020-01-27 – 2020-01-28 (×2): 1 via ORAL
  Filled 2020-01-27 (×2): qty 1

## 2020-01-27 MED ORDER — PROMETHAZINE HCL 25 MG/ML IJ SOLN
6.2500 mg | Freq: Once | INTRAMUSCULAR | Status: AC
  Administered 2020-01-27: 6.25 mg via INTRAVENOUS
  Filled 2020-01-27: qty 1

## 2020-01-27 MED ORDER — CIPROFLOXACIN HCL 500 MG PO TABS
500.0000 mg | ORAL_TABLET | Freq: Two times a day (BID) | ORAL | Status: DC
  Administered 2020-01-27 – 2020-01-28 (×2): 500 mg via ORAL
  Filled 2020-01-27 (×2): qty 1

## 2020-01-27 NOTE — Progress Notes (Signed)
Patient endorsed ongoing weakness during our encounter this morning and notes that his son is requesting rehab for patient. Discussed with patient's son, Kenton Fortin. Mat Carne reports that due to significant deconditioning, patient is unable to safely stay at home on his own and is requesting short term rehab. He notes that patient was previously discharged to home in stable condition but required readmission for his current condition. Discussed the circumstances leading up to readmission with Mat Carne including development of pneumonia on setting of rib fractures leading to decreased inspiratory/expiratory efforts. Discussed that he is currently on antibiotics for this and that, from medical standpoint, he is to continue antibiotics and is stable for discharge. Patient's son does not feel comfortable with just having home health at this point and would like re-evaluation for possible short-term rehab.
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
## 2020-01-28 LAB — BASIC METABOLIC PANEL
Anion gap: 11 (ref 5–15)
BUN: 21 mg/dL (ref 8–23)
CO2: 22 mmol/L (ref 22–32)
Calcium: 8.5 mg/dL — ABNORMAL LOW (ref 8.9–10.3)
Chloride: 102 mmol/L (ref 98–111)
Creatinine, Ser: 1.37 mg/dL — ABNORMAL HIGH (ref 0.61–1.24)
GFR calc Af Amer: >60 mL/min (ref >60)
GFR calc non Af Amer: 53 mL/min — ABNORMAL LOW (ref >60)
Glucose, Bld: 92 mg/dL (ref 70–99)
Potassium: 3.6 mmol/L (ref 3.5–5.1)
Sodium: 135 mmol/L (ref 135–145)

## 2020-01-28 LAB — CBC
HCT: 32.7 % — ABNORMAL LOW (ref 39.0–52.0)
Hemoglobin: 10.6 g/dL — ABNORMAL LOW (ref 13.0–17.0)
MCH: 28.8 pg (ref 26.0–34.0)
MCHC: 32.4 g/dL (ref 30.0–36.0)
MCV: 88.9 fL (ref 80.0–100.0)
Platelets: 286 10*3/uL (ref 150–400)
RBC: 3.68 MIL/uL — ABNORMAL LOW (ref 4.22–5.81)
RDW: 13.9 % (ref 11.5–15.5)
WBC: 12.4 10*3/uL — ABNORMAL HIGH (ref 4.0–10.5)
nRBC: 0 % (ref 0.0–0.2)

## 2020-01-28 LAB — SARS CORONAVIRUS 2 (TAT 6-24 HRS): SARS Coronavirus 2: NEGATIVE

## 2020-01-28 MED ORDER — LEVOFLOXACIN 750 MG PO TABS
750.0000 mg | ORAL_TABLET | Freq: Every day | ORAL | Status: DC
  Administered 2020-01-29 – 2020-01-30 (×2): 750 mg via ORAL
  Filled 2020-01-28 (×2): qty 1

## 2020-01-28 NOTE — Progress Notes (Signed)
  Date: 01/28/2020  Patient name: XZAVIAR MALOOF  Medical record number: [id number]  Date of birth: 04/22/1951        I have seen and evaluated this patient and I have discussed the plan of care with the house staff. Please see their note for complete details. I concur with their findings with the following additions/corrections: Mr. Patras was seen this morning on team rounds.  He does not have much appetite but is trying to force himself to eat to hasten his recovery.  Vital signs are stable.  In particular, he is afebrile and satting low to mid 90s on room air.  His renal function continues to improve, his white blood cell count continues to decrease, and his hemoglobin remained stable.  I personally viewed his chest x-ray which was an AP portable semiupright and did not show any abnormalities.  However, his CT scan did show possible infiltrate in the bilateral lower lobes in the right middle lobe.  As he was symptomatic, his antibiotics were broadened to cover pulmonary.  As he is now stable to be transferred to SNF, due to deconditioning, we will narrow his antibiotics and collaborated with our pharmacy colleagues to select a flora quinolone with both respiratory and urinary coverage.  He is stable to be transferred to SNF once a bed is available.  Burns Spain, MD 01/28/2020, 11:43 AM
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
## 2020-01-28 NOTE — Progress Notes (Signed)
The patient, Anthony [name] urine culture, showed that it is currently growing the Citrobacter organism. Urology recommends treating the patient for UTI. The patient is being treated for pneumonia as well. The physician is looking to use an antibiotic to treat UTI and pneumonia by utilizing a simplified regimen. This particular organism is sensitive to fluoroquinolones. According to [employer], levofloxacin 750 mg oral or IV of 7 days would be an appropriate drug choice for this patient. I have conferred with the medical resident and have provided this recommendation.  Tamsen Meek, Student Pharmacist
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
## 2020-01-28 NOTE — TOC Progression Note (Addendum)
Transition of Care [facility]) - Progression Note    Patient Details  Name: Anthony [name] MRN: [id number] Date of Birth: 12-Oct-1950  Transition of Care [facility]) CM/SW Contact  Terrial Rhodes, LCSWA Phone Number: 01/28/2020, 4:43 PM  Clinical Narrative:     CSW spoke with Archie Patten from [place] and she is checking into see if she would be able to accept workers comp patient for SNF. CSW also left a voicemail with [facility] and [facility] to see if they would accept Workers comp for SNF.   Pending bed offers.  CSW will continue to follow.  Expected Discharge Plan: Skilled Nursing Facility Barriers to Discharge: Continued Medical Work up  Expected Discharge Plan and Services Expected Discharge Plan: Skilled Nursing Facility       Living arrangements for the past 2 months: Single Family Home                                       Social Determinants of Health (SDOH) Interventions    Readmission Risk Interventions No flowsheet data found.
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
## 2020-01-29 LAB — CBC
HCT: 33.8 % — ABNORMAL LOW (ref 39.0–52.0)
Hemoglobin: 10.9 g/dL — ABNORMAL LOW (ref 13.0–17.0)
MCH: 28.5 pg (ref 26.0–34.0)
MCHC: 32.2 g/dL (ref 30.0–36.0)
MCV: 88.3 fL (ref 80.0–100.0)
Platelets: 318 10*3/uL (ref 150–400)
RBC: 3.83 MIL/uL — ABNORMAL LOW (ref 4.22–5.81)
RDW: 14 % (ref 11.5–15.5)
WBC: 9.6 10*3/uL (ref 4.0–10.5)
nRBC: 0 % (ref 0.0–0.2)

## 2020-01-29 LAB — BASIC METABOLIC PANEL
Anion gap: 12 (ref 5–15)
BUN: 21 mg/dL (ref 8–23)
CO2: 23 mmol/L (ref 22–32)
Calcium: 8.6 mg/dL — ABNORMAL LOW (ref 8.9–10.3)
Chloride: 102 mmol/L (ref 98–111)
Creatinine, Ser: 1.38 mg/dL — ABNORMAL HIGH (ref 0.61–1.24)
GFR calc Af Amer: >60 mL/min (ref >60)
GFR calc non Af Amer: 53 mL/min — ABNORMAL LOW (ref >60)
Glucose, Bld: 109 mg/dL — ABNORMAL HIGH (ref 70–99)
Potassium: 3.9 mmol/L (ref 3.5–5.1)
Sodium: 137 mmol/L (ref 135–145)

## 2020-01-29 LAB — CULTURE, BLOOD (ROUTINE X 2)
Culture: NO GROWTH
Culture: NO GROWTH
Special Requests: ADEQUATE

## 2020-01-29 NOTE — TOC Progression Note (Addendum)
Transition of Care [facility]) - Progression Note    Patient Details  Name: Anthony [name] MRN: [id number] Date of Birth: 04-Apr-1951  [facility]) CM/SW Contact  Terrial Rhodes, LCSWA Phone Number: 01/29/2020, 9:24 AM  Clinical Narrative:     Update:6/8 3:09pm Patients daughter has chose to go with [facility] for SNF placement. Tresa Endo with [facility] and Sherri Case manager from Workers comp are working together for [name] placement for patient. CSW will continue to follow for when paperwork is complete.  Update 6/8-2:28pm-CSW spoke with Tresa Endo with [facility] to confirm SNF placement for patient. Tresa Endo let CSW know she is going to reach out to [employer] the workers comp Sports coach. Tresa Endo said she will call CSW back on whether or not she can take patient tomorrow for SNF placement.   CSW will continue to follow.  CSW spoke with patient at bedside to offer SNF choice. Patient wants CSW to call his daughter [name] to help make SNF choice. CSW reached out to daughter [name] and gave SNF options. Patients daughter will call CSW back with choice after speaking with her brother.  Pending SNF choice CSW will continue to follow.  Expected Discharge Plan: Skilled Nursing Facility Barriers to Discharge: Continued Medical Work up  Expected Discharge Plan and Services Expected Discharge Plan: Skilled Nursing Facility       Living arrangements for the past 2 months: Single Family Home                                       Social Determinants of Health (SDOH) Interventions    Readmission Risk Interventions No flowsheet data found.
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
## 2020-01-30 LAB — CBC
HCT: 36 % — ABNORMAL LOW (ref 39.0–52.0)
Hemoglobin: 11.6 g/dL — ABNORMAL LOW (ref 13.0–17.0)
MCH: 28.4 pg (ref 26.0–34.0)
MCHC: 32.2 g/dL (ref 30.0–36.0)
MCV: 88.2 fL (ref 80.0–100.0)
Platelets: 376 10*3/uL (ref 150–400)
RBC: 4.08 MIL/uL — ABNORMAL LOW (ref 4.22–5.81)
RDW: 14.2 % (ref 11.5–15.5)
WBC: 9.6 10*3/uL (ref 4.0–10.5)
nRBC: 0 % (ref 0.0–0.2)

## 2020-01-30 LAB — BASIC METABOLIC PANEL
Anion gap: 9 (ref 5–15)
BUN: 18 mg/dL (ref 8–23)
CO2: 24 mmol/L (ref 22–32)
Calcium: 9 mg/dL (ref 8.9–10.3)
Chloride: 103 mmol/L (ref 98–111)
Creatinine, Ser: 1.31 mg/dL — ABNORMAL HIGH (ref 0.61–1.24)
GFR calc Af Amer: >60 mL/min (ref >60)
GFR calc non Af Amer: 56 mL/min — ABNORMAL LOW (ref >60)
Glucose, Bld: 104 mg/dL — ABNORMAL HIGH (ref 70–99)
Potassium: 3.7 mmol/L (ref 3.5–5.1)
Sodium: 136 mmol/L (ref 135–145)

## 2020-01-30 MED ORDER — OXYCODONE HCL 5 MG PO TABS: 5.0000 mg | ORAL_TABLET | Freq: Four times a day (QID) | ORAL | 0 refills | Status: AC | PRN

## 2020-01-30 NOTE — TOC Transition Note (Signed)
Transition of Care [facility]) - CM/SW Discharge Note   Patient Details  Name: AVARY PITSENBARGER MRN: [id number] Date of Birth: 12-24-1950  Transition of Care [facility]) CM/SW Contact:  Terrial Rhodes, LCSWA Phone Number: 01/30/2020, 11:28 AM   Clinical Narrative:     Patient will DC to: [facility]   Anticipated DC date: 01/30/2020  Family notified: [place] Sink  Transport by: Sharin Mons  ?  Per MD patient ready for DC to [facility] . RN, patient, patient's family, and facility notified of DC. Discharge Summary sent to facility. RN given number for report tele# [phone] RM# [id number]. DC packet on chart. Ambulance transport requested for patient.  CSW signing off.  Final next level of care: Skilled Nursing Facility Barriers to Discharge: No Barriers Identified   Patient Goals and CMS Choice Patient states their goals for this hospitalization and ongoing recovery are:: to go to SNF CMS Medicare.gov Compare Post Acute Care list provided to:: Patient Choice offered to / list presented to : Patient  Discharge Placement              Patient chooses bed at: [facility] Patient to be transferred to facility by: [facility] Name of family member notified: [place] Sink Patient and family notified of of transfer: 01/30/20  Discharge Plan and Services                                     Social Determinants of Health (SDOH) Interventions     Readmission Risk Interventions No flowsheet data found.
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
## 2020-01-31 DIAGNOSIS — K219 Gastro-esophageal reflux disease without esophagitis: Secondary | ICD-10-CM | POA: Diagnosis not present

## 2020-01-31 DIAGNOSIS — S36039S Unspecified laceration of spleen, sequela: Secondary | ICD-10-CM | POA: Diagnosis not present

## 2020-01-31 DIAGNOSIS — J189 Pneumonia, unspecified organism: Secondary | ICD-10-CM | POA: Diagnosis not present

## 2020-01-31 DIAGNOSIS — N3000 Acute cystitis without hematuria: Secondary | ICD-10-CM | POA: Diagnosis not present

## 2020-02-01 ENCOUNTER — Telehealth: Payer: Self-pay | Admitting: Internal Medicine

## 2020-02-01 NOTE — Telephone Encounter (Signed)
Called pt for follow up of condition. Pt in [facility] and reports feeling well overall. Cough has improved. PT visited him yesterday to work on his ambulation. Pt walking well. OT working with him to resume ADLs independently. Still experiencing back pain due to humid weather. Pt reports there was a problem with his medications being sent to the SNF, specifcallly his oxycodone, but the facility will be receiving his pain medication today. Anticipates staying in the facility for 1 more week. All questions and concerns addressed.  Augustine Radar, MS3 Pager: [phone]

## 2020-02-01 NOTE — Telephone Encounter (Signed)
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 I was present for the phone call and documentation.

## 2020-02-05 DIAGNOSIS — N1832 Chronic kidney disease, stage 3b: Secondary | ICD-10-CM | POA: Diagnosis not present

## 2020-02-05 DIAGNOSIS — R2689 Other abnormalities of gait and mobility: Secondary | ICD-10-CM | POA: Diagnosis not present

## 2020-02-05 DIAGNOSIS — S32009D Unspecified fracture of unspecified lumbar vertebra, subsequent encounter for fracture with routine healing: Secondary | ICD-10-CM | POA: Diagnosis not present

## 2020-02-05 DIAGNOSIS — S22009D Unspecified fracture of unspecified thoracic vertebra, subsequent encounter for fracture with routine healing: Secondary | ICD-10-CM | POA: Diagnosis not present

## 2020-02-12 DIAGNOSIS — S0181XD Laceration without foreign body of other part of head, subsequent encounter: Secondary | ICD-10-CM | POA: Diagnosis not present

## 2020-02-12 DIAGNOSIS — S2242XD Multiple fractures of ribs, left side, subsequent encounter for fracture with routine healing: Secondary | ICD-10-CM | POA: Diagnosis not present

## 2020-02-12 DIAGNOSIS — J189 Pneumonia, unspecified organism: Secondary | ICD-10-CM | POA: Diagnosis not present

## 2020-02-12 DIAGNOSIS — S36039S Unspecified laceration of spleen, sequela: Secondary | ICD-10-CM | POA: Diagnosis not present

## 2020-02-19 DIAGNOSIS — Z09 Encounter for follow-up examination after completed treatment for conditions other than malignant neoplasm: Secondary | ICD-10-CM | POA: Diagnosis not present

## 2020-02-21 ENCOUNTER — Telehealth: Payer: Self-pay | Admitting: *Deleted

## 2020-02-21 ENCOUNTER — Encounter: Payer: Self-pay | Admitting: *Deleted

## 2020-02-21 NOTE — Telephone Encounter (Signed)
Patient enrolled for [employer] to ship a 30 day cardiac event monitor to his home. Letter with instructions mailed to patient. 

## 2020-02-29 ENCOUNTER — Ambulatory Visit (INDEPENDENT_AMBULATORY_CARE_PROVIDER_SITE_OTHER): Payer: BC Managed Care – PPO

## 2020-02-29 DIAGNOSIS — R55 Syncope and collapse: Secondary | ICD-10-CM | POA: Diagnosis not present

## 2020-03-05 DIAGNOSIS — H524 Presbyopia: Secondary | ICD-10-CM | POA: Diagnosis not present

## 2020-03-05 DIAGNOSIS — H2513 Age-related nuclear cataract, bilateral: Secondary | ICD-10-CM | POA: Diagnosis not present

## 2020-04-02 DIAGNOSIS — N200 Calculus of kidney: Secondary | ICD-10-CM | POA: Diagnosis not present

## 2020-04-02 DIAGNOSIS — Z87440 Personal history of urinary (tract) infections: Secondary | ICD-10-CM | POA: Diagnosis not present

## 2020-04-02 DIAGNOSIS — R351 Nocturia: Secondary | ICD-10-CM | POA: Diagnosis not present

## 2020-04-02 DIAGNOSIS — N401 Enlarged prostate with lower urinary tract symptoms: Secondary | ICD-10-CM | POA: Diagnosis not present

## 2020-04-04 ENCOUNTER — Other Ambulatory Visit (HOSPITAL_COMMUNITY): Payer: Self-pay | Admitting: Urology

## 2020-04-04 ENCOUNTER — Other Ambulatory Visit: Payer: Self-pay | Admitting: Urology

## 2020-04-04 DIAGNOSIS — N200 Calculus of kidney: Secondary | ICD-10-CM

## 2020-04-07 DIAGNOSIS — Z1211 Encounter for screening for malignant neoplasm of colon: Secondary | ICD-10-CM | POA: Diagnosis not present

## 2020-04-07 DIAGNOSIS — R42 Dizziness and giddiness: Secondary | ICD-10-CM | POA: Diagnosis not present

## 2020-04-07 DIAGNOSIS — Z23 Encounter for immunization: Secondary | ICD-10-CM | POA: Diagnosis not present

## 2020-04-07 DIAGNOSIS — Z0000 Encounter for general adult medical examination without abnormal findings: Secondary | ICD-10-CM | POA: Diagnosis not present

## 2020-04-07 DIAGNOSIS — M545 Low back pain: Secondary | ICD-10-CM | POA: Diagnosis not present

## 2020-04-10 ENCOUNTER — Telehealth: Payer: Self-pay

## 2020-04-10 NOTE — Telephone Encounter (Addendum)
Left a vice message for the patient to give me a call back at the office to inform him of his cardiac monitor results.  ----- Message from Beatriz Stallion, PA-C sent at 04/10/2020  6:50 AM EDT ----- Please notify the patient that his heart monitor did not reveal any concerning abnormal heart rhythms. No further work-up at this time to evaluate his passing out spell a couple months ago. Thanks!

## 2020-04-30 ENCOUNTER — Other Ambulatory Visit: Payer: Self-pay | Admitting: Urology

## 2020-04-30 NOTE — Patient Instructions (Addendum)
DUE TO COVID-19 ONLY ONE VISITOR IS ALLOWED TO COME WITH YOU AND STAY IN THE WAITING ROOM ONLY  DURING  PRE OP AND PROCEDURE.   IF YOU WILL BE ADMITTED INTO THE HOSPITAL YOU ARE ALLOWED ONE SUPPORT PERSON DURING VISITATION  HOURS  ONLY (10AM -8PM)   . The support person may change daily. . The support person must pass our screening, gel in and out, and wear a mask at all times, including in the patient's room. . Patients must also wear a mask when staff or their support person are in the room.   COVID SWAB TESTING MUST BE COMPLETED ON:  Monday, 05-05-20 @ 10:30 AM.    [address] [place], [place] [address]  (Must self quarantine after testing. Follow instructions on handout.)         Your procedure is scheduled on: Thursday, 05-08-20   Report to [facility] Main  Entrance    Report to admitting at 9:30 AM   Call this number if you have problems the morning of surgery [phone]     Do not eat food :After Midnight.   May have liquids until 8:30 AM day of surgery  CLEAR LIQUID DIET  Foods Allowed                                                                     Foods Excluded  Water, Black Coffee and tea, regular and decaf           liquids that you cannot  Plain Jell-O in any flavor  (No red)                                  see through such as: Fruit ices (not with fruit pulp)                                      milk, soups, orange juice              Iced Popsicles (No red)                                      All solid food                                   Apple juices Sports drinks like Gatorade (No red) Lightly seasoned clear broth or consume(fat free) Sugar, honey syrup       Oral Hygiene is also important to reduce your risk of infection.                                    Remember - BRUSH YOUR TEETH THE MORNING OF SURGERY WITH YOUR REGULAR TOOTHPASTE   Do NOT smoke after Midnight   Take these medicines the morning of surgery with A SIP OF WATER:   Pantoprazole, Tamsulosin  You may not have any metal on your body including  jewelry, and body piercings             Do not wear  lotions, powders, perfumes/cologne, or deodorant             Men may shave face and neck.    Do not bring valuables to the hospital. [facility] IS NOT RESPONSIBLE   FOR VALUABLES.   Contacts, dentures or bridgework may not be worn into surgery.   Bring small overnight bag day of surgery.                   Please read over the following fact sheets you were given: IF YOU HAVE QUESTIONS ABOUT YOUR PRE OP INSTRUCTIONS  PLEASE CALL [phone]    - Preparing for Surgery Before surgery, you can play an important role.  Because skin is not sterile, your skin needs to be as free of germs as possible.  You can reduce the number of germs on your skin by washing with CHG (chlorahexidine gluconate) soap before surgery.  CHG is an antiseptic cleaner which kills germs and bonds with the skin to continue killing germs even after washing. Please DO NOT use if you have an allergy to CHG or antibacterial soaps.  If your skin becomes reddened/irritated stop using the CHG and inform your nurse when you arrive at Short Stay. Do not shave (including legs and underarms) for at least 48 hours prior to the first CHG shower.  You may shave your face/neck.  Please follow these instructions carefully:  1.  Shower with CHG Soap the night before surgery and the  morning of surgery.  2.  If you choose to wash your hair, wash your hair first as usual with your normal  shampoo.  3.  After you shampoo, rinse your hair and body thoroughly to remove the shampoo.                             4.  Use CHG as you would any other liquid soap.  You can apply chg directly to the skin and wash.  Gently with a scrungie or clean washcloth.  5.  Apply the CHG Soap to your body ONLY FROM THE NECK DOWN.   Do   not use on face/ open                           Wound  or open sores. Avoid contact with eyes, ears mouth and   genitals (private parts).                       Wash face,  Genitals (private parts) with your normal soap.             6.  Wash thoroughly, paying special attention to the area where your    surgery  will be performed.  7.  Thoroughly rinse your body with warm water from the neck down.  8.  DO NOT shower/wash with your normal soap after using and rinsing off the CHG Soap.                9.  Pat yourself dry with a clean towel.            10.  Wear clean pajamas.  11.  Place clean sheets on your bed the night of your first shower and do not  sleep with pets. Day of Surgery : Do not apply any lotions/deodorants the morning of surgery.  Please wear clean clothes to the hospital/surgery center.  FAILURE TO FOLLOW THESE INSTRUCTIONS MAY RESULT IN THE CANCELLATION OF YOUR SURGERY  PATIENT SIGNATURE_________________________________  NURSE SIGNATURE__________________________________  ________________________________________________________________________

## 2020-04-30 NOTE — Progress Notes (Addendum)
COVID Vaccine Completed: No Date COVID Vaccine completed: COVID vaccine manufacturer: [employer]   PCP - Windle Guard, MD Cardiologist - [facility]  Chest x-ray - 01-24-20 in Epic  CT chest - 01-24-20 in Epic EKG - 01-25-20 in Epic Stress Test -  ECHO - 01-15-20 in Epic Cardiac Cath -  Heart Monitor - 02-29-20 in Epic  Sleep Study -  CPAP -   Fasting Blood Sugar -  Checks Blood Sugar _____ times a day  Blood Thinner Instructions: Aspirin Instructions: Last Dose:  Anesthesia review:  Hx of MVP and syncope  Patient denies shortness of breath, fever, cough and chest pain at PAT appointment   Patient verbalized understanding of instructions that were given to them at the PAT appointment. Patient was also instructed that they will need to review over the PAT instructions again at home before surgery.

## 2020-05-02 ENCOUNTER — Encounter (HOSPITAL_COMMUNITY)
Admission: RE | Admit: 2020-05-02 | Discharge: 2020-05-02 | Disposition: A | Discharge status: Home / Self Care | Payer: BC Managed Care – PPO | Source: Ambulatory Visit | Attending: Urology | Admitting: Urology

## 2020-05-02 ENCOUNTER — Encounter (HOSPITAL_COMMUNITY): Payer: Self-pay

## 2020-05-02 ENCOUNTER — Other Ambulatory Visit: Payer: Self-pay

## 2020-05-02 DIAGNOSIS — Z01812 Encounter for preprocedural laboratory examination: Secondary | ICD-10-CM | POA: Insufficient documentation

## 2020-05-02 HISTORY — DX: Pneumonia, unspecified organism: J18.9

## 2020-05-02 LAB — BASIC METABOLIC PANEL
Anion gap: 7 (ref 5–15)
BUN: 19 mg/dL (ref 8–23)
CO2: 27 mmol/L (ref 22–32)
Calcium: 9.3 mg/dL (ref 8.9–10.3)
Chloride: 102 mmol/L (ref 98–111)
Creatinine, Ser: 1.56 mg/dL — ABNORMAL HIGH (ref 0.61–1.24)
GFR calc Af Amer: 52 mL/min — ABNORMAL LOW (ref >60)
GFR calc non Af Amer: 45 mL/min — ABNORMAL LOW (ref >60)
Glucose, Bld: 100 mg/dL — ABNORMAL HIGH (ref 70–99)
Potassium: 4.5 mmol/L (ref 3.5–5.1)
Sodium: 136 mmol/L (ref 135–145)

## 2020-05-02 LAB — CBC
HCT: 48.7 % (ref 39.0–52.0)
Hemoglobin: 15.8 g/dL (ref 13.0–17.0)
MCH: 28.7 pg (ref 26.0–34.0)
MCHC: 32.4 g/dL (ref 30.0–36.0)
MCV: 88.4 fL (ref 80.0–100.0)
Platelets: 166 10*3/uL (ref 150–400)
RBC: 5.51 MIL/uL (ref 4.22–5.81)
RDW: 13.2 % (ref 11.5–15.5)
WBC: 5.4 10*3/uL (ref 4.0–10.5)
nRBC: 0 % (ref 0.0–0.2)

## 2020-05-05 ENCOUNTER — Other Ambulatory Visit (HOSPITAL_COMMUNITY)
Admission: RE | Admit: 2020-05-05 | Discharge: 2020-05-05 | Disposition: A | Discharge status: Home / Self Care | Payer: BC Managed Care – PPO | Source: Ambulatory Visit | Attending: Urology | Admitting: Urology

## 2020-05-05 DIAGNOSIS — Z20822 Contact with and (suspected) exposure to covid-19: Secondary | ICD-10-CM | POA: Diagnosis not present

## 2020-05-05 DIAGNOSIS — Z01812 Encounter for preprocedural laboratory examination: Secondary | ICD-10-CM | POA: Diagnosis not present

## 2020-05-06 LAB — SARS CORONAVIRUS 2 (TAT 6-24 HRS): SARS Coronavirus 2: NEGATIVE

## 2020-05-07 ENCOUNTER — Other Ambulatory Visit: Payer: Self-pay | Admitting: Student

## 2020-05-07 NOTE — Progress Notes (Signed)
Pt advised that surgery time for his 05/08/20 surgery is now 11:00 AM, in lieu of 11:30 AM. Pt to report to admitting at 9:00 AM..Marland KitchenPt  verbalized understanding.
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
## 2020-05-08 ENCOUNTER — Ambulatory Visit (HOSPITAL_COMMUNITY)
Admission: RE | Admit: 2020-05-08 | Discharge: 2020-05-08 | Disposition: A | Discharge status: Home / Self Care | Payer: BC Managed Care – PPO | Source: Ambulatory Visit | Attending: Urology | Admitting: Urology

## 2020-05-08 ENCOUNTER — Other Ambulatory Visit: Payer: Self-pay

## 2020-05-08 ENCOUNTER — Ambulatory Visit (HOSPITAL_COMMUNITY): Payer: BC Managed Care – PPO | Admitting: Anesthesiology

## 2020-05-08 ENCOUNTER — Encounter (HOSPITAL_COMMUNITY): Payer: Self-pay | Admitting: Urology

## 2020-05-08 ENCOUNTER — Ambulatory Visit (HOSPITAL_COMMUNITY): Payer: BC Managed Care – PPO

## 2020-05-08 ENCOUNTER — Encounter (HOSPITAL_COMMUNITY)
Admission: RE | Disposition: A | Discharge status: Home / Self Care | Payer: Self-pay | Source: Home / Self Care | Attending: Urology

## 2020-05-08 ENCOUNTER — Ambulatory Visit (HOSPITAL_COMMUNITY): Payer: BC Managed Care – PPO | Admitting: Physician Assistant

## 2020-05-08 ENCOUNTER — Observation Stay (HOSPITAL_COMMUNITY)
Admission: RE | Admit: 2020-05-08 | Discharge: 2020-05-09 | Disposition: A | Discharge status: Home / Self Care | Payer: BC Managed Care – PPO | Attending: Urology | Admitting: Urology

## 2020-05-08 DIAGNOSIS — N1831 Chronic kidney disease, stage 3a: Secondary | ICD-10-CM | POA: Insufficient documentation

## 2020-05-08 DIAGNOSIS — Z87891 Personal history of nicotine dependence: Secondary | ICD-10-CM | POA: Insufficient documentation

## 2020-05-08 DIAGNOSIS — K219 Gastro-esophageal reflux disease without esophagitis: Secondary | ICD-10-CM | POA: Diagnosis not present

## 2020-05-08 DIAGNOSIS — N132 Hydronephrosis with renal and ureteral calculous obstruction: Secondary | ICD-10-CM | POA: Diagnosis not present

## 2020-05-08 DIAGNOSIS — E782 Mixed hyperlipidemia: Secondary | ICD-10-CM | POA: Diagnosis not present

## 2020-05-08 DIAGNOSIS — N200 Calculus of kidney: Principal | ICD-10-CM | POA: Insufficient documentation

## 2020-05-08 DIAGNOSIS — Z79899 Other long term (current) drug therapy: Secondary | ICD-10-CM | POA: Diagnosis not present

## 2020-05-08 DIAGNOSIS — A419 Sepsis, unspecified organism: Secondary | ICD-10-CM | POA: Diagnosis not present

## 2020-05-08 HISTORY — PX: IR URETERAL STENT LEFT NEW ACCESS W/O SEP NEPHROSTOMY CATH: IMG6075

## 2020-05-08 HISTORY — PX: NEPHROLITHOTOMY: SHX5134

## 2020-05-08 LAB — CBC
HCT: 47.2 % (ref 39.0–52.0)
Hemoglobin: 15.3 g/dL (ref 13.0–17.0)
MCH: 28.3 pg (ref 26.0–34.0)
MCHC: 32.4 g/dL (ref 30.0–36.0)
MCV: 87.4 fL (ref 80.0–100.0)
Platelets: 182 10*3/uL (ref 150–400)
RBC: 5.4 MIL/uL (ref 4.22–5.81)
RDW: 13.2 % (ref 11.5–15.5)
WBC: 5.3 10*3/uL (ref 4.0–10.5)
nRBC: 0 % (ref 0.0–0.2)

## 2020-05-08 LAB — BASIC METABOLIC PANEL
Anion gap: 8 (ref 5–15)
BUN: 19 mg/dL (ref 8–23)
CO2: 24 mmol/L (ref 22–32)
Calcium: 9.2 mg/dL (ref 8.9–10.3)
Chloride: 105 mmol/L (ref 98–111)
Creatinine, Ser: 1.59 mg/dL — ABNORMAL HIGH (ref 0.61–1.24)
GFR calc Af Amer: 51 mL/min — ABNORMAL LOW (ref >60)
GFR calc non Af Amer: 44 mL/min — ABNORMAL LOW (ref >60)
Glucose, Bld: 102 mg/dL — ABNORMAL HIGH (ref 70–99)
Potassium: 3.9 mmol/L (ref 3.5–5.1)
Sodium: 137 mmol/L (ref 135–145)

## 2020-05-08 LAB — HEMOGLOBIN AND HEMATOCRIT, BLOOD
HCT: 41.1 % (ref 39.0–52.0)
Hemoglobin: 13.4 g/dL (ref 13.0–17.0)

## 2020-05-08 LAB — PROTIME-INR
INR: 1 (ref 0.8–1.2)
Prothrombin Time: 12.7 seconds (ref 11.4–15.2)

## 2020-05-08 SURGERY — NEPHROLITHOTOMY PERCUTANEOUS
Anesthesia: General | Laterality: Left

## 2020-05-08 MED ORDER — PHENYLEPHRINE HCL-NACL 10-0.9 MG/250ML-% IV SOLN
INTRAVENOUS | Status: DC | PRN
  Administered 2020-05-08: 35 ug/min via INTRAVENOUS

## 2020-05-08 MED ORDER — IOHEXOL 300 MG/ML  SOLN
INTRAMUSCULAR | Status: DC | PRN
  Administered 2020-05-08: 8 mL

## 2020-05-08 MED ORDER — CEFAZOLIN SODIUM-DEXTROSE 2-4 GM/100ML-% IV SOLN
INTRAVENOUS | Status: AC
  Filled 2020-05-08: qty 100

## 2020-05-08 MED ORDER — ONDANSETRON HCL 4 MG PO TABS: 8.0000 mg | ORAL_TABLET | Freq: Three times a day (TID) | ORAL | Status: DC | PRN

## 2020-05-08 MED ORDER — 0.9 % SODIUM CHLORIDE (POUR BTL) OPTIME
TOPICAL | Status: DC | PRN
  Administered 2020-05-08: 1000 mL

## 2020-05-08 MED ORDER — MEPERIDINE HCL 50 MG/ML IJ SOLN: 6.2500 mg | INTRAMUSCULAR | Status: DC | PRN

## 2020-05-08 MED ORDER — FENTANYL CITRATE (PF) 100 MCG/2ML IJ SOLN
INTRAMUSCULAR | Status: DC
Start: 2020-05-08 — End: 2020-05-08
  Filled 2020-05-08: qty 2

## 2020-05-08 MED ORDER — DIPHENHYDRAMINE HCL 12.5 MG/5ML PO ELIX: 12.5000 mg | ORAL_SOLUTION | Freq: Four times a day (QID) | ORAL | Status: DC | PRN

## 2020-05-08 MED ORDER — DIPHENHYDRAMINE HCL 50 MG/ML IJ SOLN: 12.5000 mg | Freq: Four times a day (QID) | INTRAMUSCULAR | Status: DC | PRN

## 2020-05-08 MED ORDER — LIDOCAINE HCL 1 % IJ SOLN
INTRAMUSCULAR | Status: AC
  Filled 2020-05-08: qty 20

## 2020-05-08 MED ORDER — PHENYLEPHRINE 40 MCG/ML (10ML) SYRINGE FOR IV PUSH (FOR BLOOD PRESSURE SUPPORT)
PREFILLED_SYRINGE | INTRAVENOUS | Status: AC
  Filled 2020-05-08: qty 10

## 2020-05-08 MED ORDER — OXYCODONE HCL 5 MG PO TABS
5.0000 mg | ORAL_TABLET | ORAL | Status: DC | PRN
  Administered 2020-05-08: 5 mg via ORAL
  Filled 2020-05-08: qty 1

## 2020-05-08 MED ORDER — LACTATED RINGERS IV SOLN: INTRAVENOUS | Status: DC

## 2020-05-08 MED ORDER — IOHEXOL 300 MG/ML  SOLN
50.0000 mL | Freq: Once | INTRAMUSCULAR | Status: AC | PRN
  Administered 2020-05-08: 10 mL

## 2020-05-08 MED ORDER — CEFAZOLIN SODIUM-DEXTROSE 2-4 GM/100ML-% IV SOLN
2.0000 g | INTRAVENOUS | Status: AC
  Administered 2020-05-08: 2 g via INTRAVENOUS

## 2020-05-08 MED ORDER — PANTOPRAZOLE SODIUM 40 MG PO TBEC
40.0000 mg | DELAYED_RELEASE_TABLET | Freq: Every day | ORAL | Status: DC
  Administered 2020-05-09: 40 mg via ORAL
  Filled 2020-05-08: qty 1

## 2020-05-08 MED ORDER — CHLORHEXIDINE GLUCONATE 0.12 % MT SOLN
15.0000 mL | Freq: Once | OROMUCOSAL | Status: AC
  Administered 2020-05-08: 15 mL via OROMUCOSAL

## 2020-05-08 MED ORDER — ONDANSETRON HCL 4 MG/2ML IJ SOLN
INTRAMUSCULAR | Status: AC
  Filled 2020-05-08: qty 2

## 2020-05-08 MED ORDER — ACETAMINOPHEN 10 MG/ML IV SOLN
INTRAVENOUS | Status: AC
  Filled 2020-05-08: qty 100

## 2020-05-08 MED ORDER — TAMSULOSIN HCL 0.4 MG PO CAPS
0.8000 mg | ORAL_CAPSULE | Freq: Every day | ORAL | Status: DC
  Administered 2020-05-09: 0.8 mg via ORAL
  Filled 2020-05-08: qty 2

## 2020-05-08 MED ORDER — LIDOCAINE 2% (20 MG/ML) 5 ML SYRINGE
INTRAMUSCULAR | Status: AC
  Filled 2020-05-08: qty 5

## 2020-05-08 MED ORDER — SUGAMMADEX SODIUM 200 MG/2ML IV SOLN
INTRAVENOUS | Status: DC | PRN
  Administered 2020-05-08: 200 mg via INTRAVENOUS

## 2020-05-08 MED ORDER — CHLORHEXIDINE GLUCONATE CLOTH 2 % EX PADS
6.0000 | MEDICATED_PAD | Freq: Every day | CUTANEOUS | Status: DC
  Administered 2020-05-09: 6 via TOPICAL

## 2020-05-08 MED ORDER — MIDAZOLAM HCL 2 MG/2ML IJ SOLN
INTRAMUSCULAR | Status: AC
  Filled 2020-05-08: qty 4

## 2020-05-08 MED ORDER — CEFAZOLIN SODIUM-DEXTROSE 1-4 GM/50ML-% IV SOLN
1.0000 g | Freq: Three times a day (TID) | INTRAVENOUS | Status: DC
  Administered 2020-05-08 – 2020-05-09 (×3): 1 g via INTRAVENOUS
  Filled 2020-05-08 (×4): qty 50

## 2020-05-08 MED ORDER — ROCURONIUM BROMIDE 10 MG/ML (PF) SYRINGE
PREFILLED_SYRINGE | INTRAVENOUS | Status: AC
  Filled 2020-05-08: qty 10

## 2020-05-08 MED ORDER — EPHEDRINE SULFATE-NACL 50-0.9 MG/10ML-% IV SOSY
PREFILLED_SYRINGE | INTRAVENOUS | Status: DC | PRN
  Administered 2020-05-08: 10 mg via INTRAVENOUS

## 2020-05-08 MED ORDER — ZOLPIDEM TARTRATE 5 MG PO TABS: 5.0000 mg | ORAL_TABLET | Freq: Every evening | ORAL | Status: DC | PRN

## 2020-05-08 MED ORDER — ROCURONIUM BROMIDE 10 MG/ML (PF) SYRINGE
PREFILLED_SYRINGE | INTRAVENOUS | Status: DC | PRN
  Administered 2020-05-08: 60 mg via INTRAVENOUS

## 2020-05-08 MED ORDER — ACETAMINOPHEN 10 MG/ML IV SOLN
1000.0000 mg | Freq: Once | INTRAVENOUS | Status: DC | PRN
  Administered 2020-05-08: 1000 mg via INTRAVENOUS

## 2020-05-08 MED ORDER — ACETAMINOPHEN 325 MG PO TABS: 325.0000 mg | ORAL_TABLET | Freq: Once | ORAL | Status: DC | PRN

## 2020-05-08 MED ORDER — LIDOCAINE HCL (PF) 1 % IJ SOLN
INTRAMUSCULAR | Status: AC | PRN
  Administered 2020-05-08 (×3): 10 mL via INTRADERMAL

## 2020-05-08 MED ORDER — EPHEDRINE 5 MG/ML INJ
INTRAVENOUS | Status: AC
  Filled 2020-05-08: qty 10

## 2020-05-08 MED ORDER — POTASSIUM CHLORIDE IN NACL 20-0.45 MEQ/L-% IV SOLN
INTRAVENOUS | Status: DC
  Filled 2020-05-08 (×3): qty 1000

## 2020-05-08 MED ORDER — AMISULPRIDE (ANTIEMETIC) 5 MG/2ML IV SOLN: 10.0000 mg | Freq: Once | INTRAVENOUS | Status: DC | PRN

## 2020-05-08 MED ORDER — SODIUM CHLORIDE 0.9 % IR SOLN
Status: DC | PRN
  Administered 2020-05-08: 12000 mL

## 2020-05-08 MED ORDER — HYOSCYAMINE SULFATE 0.125 MG SL SUBL
0.1250 mg | SUBLINGUAL_TABLET | SUBLINGUAL | Status: DC | PRN
  Filled 2020-05-08: qty 1

## 2020-05-08 MED ORDER — FLEET ENEMA 7-19 GM/118ML RE ENEM: 1.0000 | ENEMA | Freq: Once | RECTAL | Status: DC | PRN

## 2020-05-08 MED ORDER — ORAL CARE MOUTH RINSE: 15.0000 mL | Freq: Once | OROMUCOSAL | Status: AC

## 2020-05-08 MED ORDER — FENTANYL CITRATE (PF) 100 MCG/2ML IJ SOLN
INTRAMUSCULAR | Status: AC | PRN
  Administered 2020-05-08: 25 ug via INTRAVENOUS
  Administered 2020-05-08 (×2): 50 ug via INTRAVENOUS
  Administered 2020-05-08: 25 ug via INTRAVENOUS

## 2020-05-08 MED ORDER — FENTANYL CITRATE (PF) 100 MCG/2ML IJ SOLN
INTRAMUSCULAR | Status: AC
  Filled 2020-05-08: qty 2

## 2020-05-08 MED ORDER — PROPOFOL 10 MG/ML IV BOLUS
INTRAVENOUS | Status: AC
  Filled 2020-05-08: qty 20

## 2020-05-08 MED ORDER — PROPOFOL 10 MG/ML IV BOLUS
INTRAVENOUS | Status: DC | PRN
  Administered 2020-05-08: 150 mg via INTRAVENOUS

## 2020-05-08 MED ORDER — ACETAMINOPHEN 325 MG PO TABS: 650.0000 mg | ORAL_TABLET | ORAL | Status: DC | PRN

## 2020-05-08 MED ORDER — MIDAZOLAM HCL 2 MG/2ML IJ SOLN
INTRAMUSCULAR | Status: AC | PRN
  Administered 2020-05-08 (×4): 1 mg via INTRAVENOUS

## 2020-05-08 MED ORDER — DEXAMETHASONE SODIUM PHOSPHATE 10 MG/ML IJ SOLN
INTRAMUSCULAR | Status: DC | PRN
  Administered 2020-05-08: 7 mg via INTRAVENOUS

## 2020-05-08 MED ORDER — ACETAMINOPHEN 160 MG/5ML PO SOLN: 325.0000 mg | Freq: Once | ORAL | Status: DC | PRN

## 2020-05-08 MED ORDER — FENTANYL CITRATE (PF) 100 MCG/2ML IJ SOLN
25.0000 ug | INTRAMUSCULAR | Status: DC | PRN
  Administered 2020-05-08: 50 ug via INTRAVENOUS

## 2020-05-08 MED ORDER — BISACODYL 10 MG RE SUPP: 10.0000 mg | Freq: Every day | RECTAL | Status: DC | PRN

## 2020-05-08 MED ORDER — FENTANYL CITRATE (PF) 100 MCG/2ML IJ SOLN
INTRAMUSCULAR | Status: DC | PRN
  Administered 2020-05-08: 25 ug via INTRAVENOUS
  Administered 2020-05-08: 75 ug via INTRAVENOUS

## 2020-05-08 MED ORDER — PANTOPRAZOLE SODIUM 20 MG PO TBEC: 20.0000 mg | DELAYED_RELEASE_TABLET | Freq: Every day | ORAL | Status: DC

## 2020-05-08 MED ORDER — SENNOSIDES-DOCUSATE SODIUM 8.6-50 MG PO TABS: 1.0000 | ORAL_TABLET | Freq: Every evening | ORAL | Status: DC | PRN

## 2020-05-08 MED ORDER — HYDROMORPHONE HCL 1 MG/ML IJ SOLN
0.5000 mg | INTRAMUSCULAR | Status: DC | PRN
  Administered 2020-05-08: 1 mg via INTRAVENOUS
  Filled 2020-05-08: qty 1

## 2020-05-08 MED ORDER — PHENYLEPHRINE HCL (PRESSORS) 10 MG/ML IV SOLN
INTRAVENOUS | Status: DC | PRN
  Administered 2020-05-08: 40 ug via INTRAVENOUS
  Administered 2020-05-08: 80 ug via INTRAVENOUS
  Administered 2020-05-08: 160 ug via INTRAVENOUS

## 2020-05-08 MED ORDER — STERILE WATER FOR IRRIGATION IR SOLN
Status: DC | PRN
  Administered 2020-05-08: 250 mL

## 2020-05-08 MED ORDER — LIDOCAINE 2% (20 MG/ML) 5 ML SYRINGE
INTRAMUSCULAR | Status: DC | PRN
  Administered 2020-05-08: 40 mg via INTRAVENOUS

## 2020-05-08 MED ORDER — ONDANSETRON HCL 4 MG/2ML IJ SOLN
INTRAMUSCULAR | Status: DC | PRN
  Administered 2020-05-08: 4 mg via INTRAVENOUS

## 2020-05-08 SURGICAL SUPPLY — 49 items
BAG URINE DRAIN 2000ML AR STRL (UROLOGICAL SUPPLIES) ×3 IMPLANT
BASKET ZERO TIP NITINOL 2.4FR (BASKET) IMPLANT
BENZOIN TINCTURE PRP APPL 2/3 (GAUZE/BANDAGES/DRESSINGS) ×3 IMPLANT
BLADE SURG 15 STRL LF DISP TIS (BLADE) IMPLANT
BLADE SURG 15 STRL SS (BLADE)
CATH AINSWORTH 30CC 24FR (CATHETERS) IMPLANT
CATH COUNCIL 22FR (CATHETERS) ×3 IMPLANT
CATH ROBINSON RED A/P 20FR (CATHETERS) IMPLANT
CATH URET 5FR 28IN OPEN ENDED (CATHETERS) ×3 IMPLANT
CATH URET DUAL LUMEN 6-10FR 50 (CATHETERS) ×3 IMPLANT
CATH UROLOGY TORQUE 40 (MISCELLANEOUS) ×3 IMPLANT
CATH X-FORCE N30 NEPHROSTOMY (TUBING) ×3 IMPLANT
CHLORAPREP W/TINT 26 (MISCELLANEOUS) ×3 IMPLANT
COVER SURGICAL LIGHT HANDLE (MISCELLANEOUS) IMPLANT
COVER WAND RF STERILE (DRAPES) IMPLANT
DRAPE C-ARM 42X120 X-RAY (DRAPES) ×3 IMPLANT
DRAPE LINGEMAN PERC (DRAPES) ×3 IMPLANT
DRAPE SURG IRRIG POUCH 19X23 (DRAPES) ×3 IMPLANT
DRSG PAD ABDOMINAL 8X10 ST (GAUZE/BANDAGES/DRESSINGS) ×6 IMPLANT
DRSG TEGADERM 8X12 (GAUZE/BANDAGES/DRESSINGS) ×3 IMPLANT
EXTRACTOR STONE NITINOL NGAGE (UROLOGICAL SUPPLIES) IMPLANT
GAUZE SPONGE 4X4 12PLY STRL (GAUZE/BANDAGES/DRESSINGS) ×3 IMPLANT
GLOVE SURG SS PI 8.0 STRL IVOR (GLOVE) ×3 IMPLANT
GOWN STRL REUS W/TWL XL LVL3 (GOWN DISPOSABLE) ×3 IMPLANT
GUIDEWIRE AMPLAZ .035X145 (WIRE) ×6 IMPLANT
GUIDEWIRE STR DUAL SENSOR (WIRE) IMPLANT
KIT BASIN OR (CUSTOM PROCEDURE TRAY) ×3 IMPLANT
KIT PROBE 340X3.4XDISP GRN (MISCELLANEOUS) IMPLANT
KIT PROBE TRILOGY 3.4X340 (MISCELLANEOUS)
KIT PROBE TRILOGY 3.9X350 (MISCELLANEOUS) ×3 IMPLANT
KIT TURNOVER KIT A (KITS) IMPLANT
LASER FIB FLEXIVA PULSE ID 365 (Laser) IMPLANT
MANIFOLD NEPTUNE II (INSTRUMENTS) ×3 IMPLANT
NS IRRIG 1000ML POUR BTL (IV SOLUTION) ×3 IMPLANT
PACK CYSTO (CUSTOM PROCEDURE TRAY) ×3 IMPLANT
SPONGE LAP 4X18 RFD (DISPOSABLE) ×3 IMPLANT
SUT SILK 2 0 30  PSL (SUTURE) ×3
SUT SILK 2 0 30 PSL (SUTURE) ×1 IMPLANT
SYR 10ML LL (SYRINGE) ×3 IMPLANT
SYR 20ML LL LF (SYRINGE) ×6 IMPLANT
TOWEL OR 17X26 10 PK STRL BLUE (TOWEL DISPOSABLE) ×3 IMPLANT
TOWEL OR NON WOVEN STRL DISP B (DISPOSABLE) ×3 IMPLANT
TRACTIP FLEXIVA PULS ID 200XHI (Laser) IMPLANT
TRACTIP FLEXIVA PULSE ID 200 (Laser)
TRAY FOLEY MTR SLVR 16FR STAT (SET/KITS/TRAYS/PACK) ×3 IMPLANT
TUBING CONNECTING 10 (TUBING) ×4 IMPLANT
TUBING CONNECTING 10' (TUBING) ×2
TUBING STONE CATCHER TRILOGY (MISCELLANEOUS) ×3 IMPLANT
TUBING UROLOGY SET (TUBING) ×3 IMPLANT

## 2020-05-08 NOTE — Anesthesia Preprocedure Evaluation (Addendum)
Anesthesia Evaluation  Patient identified by MRN, date of birth, ID band Patient awake    Reviewed: Allergy & Precautions, NPO status , Patient's Chart, lab work & pertinent test results  Airway Mallampati: I  TM Distance: >3 FB Neck ROM: Full    Dental  (+) Poor Dentition, Missing, Chipped, Dental Advisory Given,    Pulmonary former smoker,    breath sounds clear to auscultation       Cardiovascular + Valvular Problems/Murmurs MVP  Rhythm:Regular Rate:Normal + Systolic murmurs    Neuro/Psych negative neurological ROS  negative psych ROS   GI/Hepatic Neg liver ROS, hiatal hernia, GERD  Medicated,  Endo/Other    Renal/GU CRFRenal disease     Musculoskeletal  (+) Arthritis ,   Abdominal Normal abdominal exam  (+)   Peds  Hematology negative hematology ROS (+)   Anesthesia Other Findings   Reproductive/Obstetrics                           Anesthesia Physical Anesthesia Plan  ASA: II  Anesthesia Plan: General   Post-op Pain Management:    Induction: Intravenous  PONV Risk Score and Plan: 3 and Ondansetron, Dexamethasone, Midazolam and Treatment may vary due to age or medical condition  Airway Management Planned: Oral ETT  Additional Equipment: None  Intra-op Plan:   Post-operative Plan: Extubation in OR  Informed Consent: I have reviewed the patients History and Physical, chart, labs and discussed the procedure including the risks, benefits and alternatives for the proposed anesthesia with the patient or authorized representative who has indicated his/her understanding and acceptance.     Dental advisory given  Plan Discussed with: CRNA  Anesthesia Plan Comments: (Lab Results      Component                Value               Date                      WBC                      5.4                 05/02/2020                HGB                      15.8                05/02/2020                 HCT                      48.7                05/02/2020                MCV                      88.4                05/02/2020                PLT                      166  05/02/2020            Echo: 1. Left ventricular ejection fraction, by estimation, is 60 to 65%. The  left ventricle has normal function. The left ventricle has no regional  wall motion abnormalities. Left ventricular diastolic parameters are  consistent with Grade I diastolic  dysfunction (impaired relaxation).  2. Right ventricular systolic function is normal. The right ventricular  size is normal. Tricuspid regurgitation signal is inadequate for assessing  PA pressure.  3. The mitral valve is normal in structure. No evidence of mitral valve  regurgitation. No evidence of mitral stenosis.  4. The aortic valve is tricuspid. Aortic valve regurgitation is not  visualized. No aortic stenosis is present.  5. The inferior vena cava is normal in size with greater than 50%  respiratory variability, suggesting right atrial pressure of 3 mmHg.   Covid-19 Nucleic Acid Test Results Lab Results      Component                Value               Date                      SARSCOV2NAA              NEGATIVE            05/05/2020                SARSCOV2NAA              NEGATIVE            01/27/2020                SARSCOV2NAA              NEGATIVE            01/24/2020                SARSCOV2NAA              NEGATIVE            01/14/2020                SARSCOV2NAA              NOT DETECTED        01/25/2019           )      Anesthesia Quick Evaluation

## 2020-05-08 NOTE — Interval H&P Note (Signed)
History and Physical Interval Note:  05/08/2020 11:15 AM  Anthony [name]  has presented today for surgery, with the diagnosis of LEFT UPPER POLE STONE.  The various methods of treatment have been discussed with the patient and family. After consideration of risks, benefits and other options for treatment, the patient has consented to  Procedure(s): LEFT NEPHROLITHOTOMY PERCUTANEOUS (Left) as a surgical intervention.  The patient's history has been reviewed, patient examined, no change in status, stable for surgery.  I have reviewed the patient's chart and labs.  Questions were answered to the patient's satisfaction.     Bjorn Pippin

## 2020-05-08 NOTE — Anesthesia Procedure Notes (Signed)
Procedure Name: Intubation Date/Time: 05/08/2020 11:49 AM Performed by: Orest Dikes, CRNA Pre-anesthesia Checklist: Patient identified, Emergency Drugs available, Suction available and Patient being monitored Patient Re-evaluated:Patient Re-evaluated prior to induction Oxygen Delivery Method: Circle system utilized Preoxygenation: Pre-oxygenation with 100% oxygen Induction Type: IV induction Ventilation: Mask ventilation without difficulty Laryngoscope Size: Glidescope and 4 Grade View: Grade I Tube type: Oral Tube size: 7.5 mm Number of attempts: 1 Airway Equipment and Method: Stylet and Oral airway Placement Confirmation: ETT inserted through vocal cords under direct vision,  positive ETCO2 and breath sounds checked- equal and bilateral Secured at: 23 cm Tube secured with: Tape Dental Injury: Teeth and Oropharynx as per pre-operative assessment

## 2020-05-08 NOTE — Transfer of Care (Signed)
Immediate Anesthesia Transfer of Care Note  Patient: Anthony [name]  Procedure(s) Performed: LEFT NEPHROLITHOTOMY PERCUTANEOUS (Left )  Patient Location: PACU  Anesthesia Type:General  Level of Consciousness: awake, alert  and oriented  Airway & Oxygen Therapy: Patient Spontanous Breathing and Patient connected to face mask oxygen  Post-op Assessment: Report given to RN and Post -op Vital signs reviewed and stable  Post vital signs: Reviewed and stable  Last Vitals:  Vitals Value Taken Time  BP    Temp    Pulse 69 05/08/20 1258  Resp 20 05/08/20 1258  SpO2 100 % 05/08/20 1258  Vitals shown include unvalidated device data.  Last Pain:  Vitals:   05/08/20 0833  TempSrc: Oral  PainSc: 0-No pain         Complications: No complications documented.

## 2020-05-08 NOTE — Anesthesia Postprocedure Evaluation (Signed)
Anesthesia Post Note  Patient: KRISHAV MAMONE  Procedure(s) Performed: LEFT NEPHROLITHOTOMY PERCUTANEOUS (Left )     Patient location during evaluation: PACU Anesthesia Type: General Level of consciousness: awake and alert Pain management: pain level controlled Vital Signs Assessment: post-procedure vital signs reviewed and stable Respiratory status: spontaneous breathing, nonlabored ventilation, respiratory function stable and patient connected to nasal cannula oxygen Cardiovascular status: blood pressure returned to baseline and stable Postop Assessment: no apparent nausea or vomiting Anesthetic complications: no   No complications documented.  Last Vitals:  Vitals:   05/08/20 1415 05/08/20 1500  BP: (!) 155/87 (!) 148/94  Pulse: 65 63  Resp: 11 15  Temp: (!) 36.3 C (!) 36.4 C  SpO2: 98% 99%    Last Pain:  Vitals:   05/08/20 1500  TempSrc:   PainSc: 1                  Emmalina Espericueta P Kamal Jurgens

## 2020-05-08 NOTE — Discharge Instructions (Signed)
Percutaneous Nephrolithotomy, Care After °This sheet gives you information about how to care for yourself after your procedure. Your health care provider may also give you more specific instructions. If you have problems or questions, contact your health care provider. °What can I expect after the procedure? °After the procedure, it is common to have: °· Soreness or pain. °· A small amount of blood or clear fluid coming from your incision for a few days. °· Fatigue. °· Some blood in your urine. This will last for a few days. °Follow these instructions at home: °Medicines °· Take over-the-counter and prescription medicines only as told by your health care provider. °· If you were prescribed an antibiotic medicine, take it as told by your health care provider. Do not stop using the antibiotic even if you start to feel better. °· Ask your health care provider if the medicine prescribed to you: °? Requires you to avoid driving or using heavy machinery. °? Can cause constipation. You may need to take these actions to prevent or treat constipation: °§ Drink enough fluid to keep your urine pale yellow. °§ Take over-the-counter or prescription medicines. °§ Eat foods that are high in fiber, such as beans, whole grains, and fresh fruits and vegetables. °§ Limit foods that are high in fat and processed sugars, such as fried or sweet foods. °Incision care ° °· Follow instructions from your health care provider about how to take care of your incision. Make sure you: °? Wash your hands with soap and water before and after you change your bandage (dressing). If soap and water are not available, use hand sanitizer. °? Change your dressing as told by your health care provider. °? Leave stitches (sutures), skin glue, or adhesive strips in place. These skin closures may need to stay in place for 2 weeks or longer. If adhesive strip edges start to loosen and curl up, you may trim the loose edges. Do not remove adhesive strips  completely unless your health care provider tells you to do that. °· Check your incision area every day for signs of infection. Check for: °? Redness, swelling, or pain. °? More fluid or blood. °? Warmth. °? Pus or a bad smell. °· Do not take baths, swim, or use a hot tub until your health care provider approves. Ask your health care provider if you may take showers. You may only be allowed to take sponge baths. °Activity °· Avoid strenuous activities for as long as told by your health care provider. °· Return to your normal activities as told by your health care provider. Ask your health care provider what activities are safe for you. °General instructions °· If you were sent home with a catheter or surgical drain (nephrostomy tube), follow your health care provider's instructions on how to take care of it. °· Wear compression stockings as told by your health care provider. These stockings help to prevent blood clots and reduce swelling in your legs. °· Do not use any products that contain nicotine or tobacco, such as cigarettes, e-cigarettes, and chewing tobacco. These can delay incision healing after surgery. If you need help quitting, ask your health care provider. °· Keep all follow-up visits as told by your health care provider. This is important. °? If you still have a stent, your health care provider will need to remove it after 1-2 weeks. °Contact a health care provider if: °· You have a fever. °· You have redness, swelling, or pain around your incision. °· You have more   fluid or blood coming from your incision. °· Your incision feels warm to the touch. °· You have pus or a bad smell coming from your incision. °· You lose your appetite. °· You feel nauseous or you vomit. °· You have been sent home with a urinary catheter or a surgical drain and urine flow suddenly stops, followed by kidney pain. °Get help right away if: °· You notice a large amount of blood in your urine. °· You have blood clots in your  urine. °· You cannot urinate. °· You have chest pain or trouble breathing. °Summary °· After the procedure, it is common to feel soreness and discomfort. You may also see blood in your urine. °· You will be told how to care for yourself after the procedure. Follow instructions on how to care for your incision and how to recognize signs of infection. °· Avoid activities that require great effort. Return to activities as told by your health care provider. °· Get help right away if you have blood clots in your urine, you cannot urinate, or you have trouble breathing. °This information is not intended to replace advice given to you by your health care provider. Make sure you discuss any questions you have with your health care provider. °Document Revised: 03/01/2018 Document Reviewed: 03/01/2018 °Elsevier Patient Education © 2020 [employer] ° °

## 2020-05-08 NOTE — Procedures (Signed)
°  Procedure: L antegrade nephroureteral catheter placement x2   EBL:   minimal Complications:  none immediate  See full dictation in [employer].  Thora Lance MD Main # [phone] Pager  [phone] Mobile [phone]

## 2020-05-08 NOTE — Op Note (Signed)
Procedure: 1.  First stage left percutaneous nephrolithotomy >2cm stone.  2.  Antegrade nephrostogram through existing access with interpretation. 3.  Application of fluoroscopy.  Preop diagnosis: Greater than 2 cm left upper pole partial staghorn stone.  Postop diagnosis: Same.  Surgeon: Dr. Irine Seal.  Anesthesia: General.  Specimen: Stone fragments.  Drains: 1.  Foley catheter. 2.  2 [place] council Foley catheter nephrostomy tube with coaxial 5 French ureteral catheter.  EBL: 100 mL.  Complications: None.  Indications: The patient is a 69 year old male with a history of recurrent urolithiasis who was found in June on a CT done for trauma to have a 2.6 cm left upper pole partial staghorn stone and a slightly smaller right lower pole partial staghorn stone.  He is to undergo percutaneous nephrolithotomy for management of the left upper pole stone today.  Procedure: He was given Ancef in interventional radiology where he underwent access placement by Dr. Vernard Gambles.  Both mid and lower pole access catheters were placed to ensure access to the upper pole stone because of concern for angulation of the calyces and the location of the spleen preventing an upper pole access.  He was taken to the operating room where a general anesthetic was induced on the holding room stretcher.  A Foley catheter was placed using sterile technique.  He was then rolled prone on chest rolls with great care taken to pad all pressure points and was fitted with PAS hose.  His nephrostomy access sites were prepped with ChloraPrep and he was draped in usual sterile fashion.  Following a review of the imaging the mid pole access was chosen for initial entry.  A Super Stiff wire was passed to the bladder under fluoroscopic guidance through the access catheter and the access catheter was removed.  The incision was lengthened to 2 cm and an 8 French dual-lumen catheter was placed over the wire into the proximal ureter where  a second Super Stiff wire was passed to the bladder without resistance.  The dual-lumen catheter was removed.  The safety wire was coiled and secured in the drainage bag and the NephroMax balloon was placed over the working wire.  The tip was advanced to the renal pelvis under fluoroscopic guidance and the balloon was inflated to 20 atm.  The access sheath was then advanced over the catheter under fluoroscopic guidance and once the sheath in good position adjacent to the stone, the balloon was deflated and removed.  There was no significant bleeding from the sheath.  The rigid nephroscope was then advanced and there was some clot that was removed with the grasping forceps.  The stone was identified and appeared soft most consistent with a struvite type of stone.  The trilogy lithotrite was then used to fragment and aspirate the stone.  I was able to access the entire stone burden through the midpole calyx.  The stone was removed primarily via suction with only a small fragment removed by graspers.  Final inspection revealed no significant residual stone fragments either visually or on fluoroscopy.  The safety wire was removed from alongside the sheath as well as the lower pole access catheter as it was not felt to be any further necessary.  A 22 [place] council Foley catheter was then used along with a 5 [place] ureteral catheter which was passed coaxially through [employer] catheter.  Ureteral catheter was then advanced over the wire until it was in the distal ureter and the Foley catheter tip was positioned in the  renal pelvis.  The balloon was filled with 2 mL of sterile fluid after the sheath had been backed out.  Contrast was instilled into the Foley and into the ureteral catheter.  The antegrade nephrostogram demonstrated good catheter position with no significant extravasation and good antegrade flow to the bladder.  The sheath was then backed out beyond the skin and the incision was closed around the  catheter with a 2-0 silk suture which was then used to secure the catheter.  Pressure was applied for approximately 5 minutes to ensure hemostasis.  The patient was then rolled supine on the recovery room stretcher.  His anesthetic was reversed and he was moved to recovery in stable condition.  There were no complications.  The stone fragments will be sent for analysis.

## 2020-05-08 NOTE — H&P (Signed)
Chief Complaint: Patient was seen in consultation today for left renal calculi/left ureteral stent placement.  Referring Physician(s): Bjorn Pippin  Supervising Physician: Oley Balm  Patient Status: [facility] - Out-pt  History of Present Illness: Anthony [name] is a 69 y.o. male with a past medical history of hyperlipidemia, MVP, pneumonia, GERD, CKD stage III, recurrent nephrolithiasis, BPH, arthritis, chronic back pain, and DDD. He has had nephrolithiasis for years, managed by Dr. Annabell Howells. Was recently found to have bilateral renal calculus, with left stone larger than right. He has tentative plans for percutaneous nephrolithotomy today in OR with Dr. Annabell Howells.  IR consulted by Dr. Annabell Howells for possible image-guided left percutaneous ureteral stent placement for access to kidney prior to OR for PCNL. Patient awake and alert sitting in bed with no complaints at this time. Denies fever, chills, chest pain, dyspnea, abdominal pain, or headache.   Past Medical History:  Diagnosis Date  . Arthritis    hands  . Benign prostatic hyperplasia   . Bilateral ureteral calculi   . Chronic back pain   . Chronic kidney disease, stage 3a   . Complication of anesthesia    self limited tremors after 04/19/15 cholecystectomy  . DDD (degenerative disc disease), lumbosacral   . GERD (gastroesophageal reflux disease)   . GERD without esophagitis   . H/O mitral valve prolapse 2008 per echo   asymptomatic and does not see cardiologist.   . Hematuria   . History of kidney stones   . Mixed hyperlipidemia   . 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 Pneumonia   . Renal calculi    left  . Renal insufficiency     Past Surgical History:  Procedure Laterality Date  . ANTERIOR CERVICAL DECOMP/DISCECTOMY FUSION  C5--C7  06-05-2002//  C7--T1   03-29-2006  . BICEPS TENDON REPAIR Right 2004 approx  . CHOLECYSTECTOMY    . COLONOSCOPY WITH PROPOFOL N/A 06/27/2015    Procedure: COLONOSCOPY WITH PROPOFOL;  Surgeon: Midge Minium, MD;  Location: [facility];  Service: Endoscopy;  Laterality: N/A;  . CYSTO/  RIGHT RETROGRADE PYELOGRAM/ URETEROSCOPY ATTEMPTED STONE MANIPULATION/  RIGHT STENT PLACEMENT  03-03-2009  . CYSTOSCOPY WITH RETROGRADE PYELOGRAM, URETEROSCOPY AND STENT PLACEMENT Right 01/24/2014   Procedure: CYSTOSCOPY WITH RETROGRADE PYELOGRAM, URETEROSCOPY AND STENT PLACEMENT, holmium laser, stone extraction;  Surgeon: Danae Chen, MD;  Location: [facility];  Service: Urology;  Laterality: Right;  . CYSTOSCOPY WITH RETROGRADE PYELOGRAM, URETEROSCOPY AND STENT PLACEMENT Right 02/07/2014   Procedure: RIGHT URETEROSCOPY, STONE EXTRACTION AND POSSIBLE STENT PLACEMENT;  Surgeon: Anner Crete, MD;  Location: [facility];  Service: Urology;  Laterality: Right;  . CYSTOSCOPY WITH RETROGRADE PYELOGRAM, URETEROSCOPY AND STENT PLACEMENT Bilateral 02/04/2015   Procedure: CYSTOSCOPY WITH RETROGRADE PYELOGRAM, URETEROSCOPY AND STENT PLACEMENT, BILATERAL;  Surgeon: Jethro Bolus, MD;  Location: [facility];  Service: Urology;  Laterality: Bilateral;  . CYSTOSCOPY WITH URETEROSCOPY, STONE BASKETRY AND STENT PLACEMENT Bilateral 02/20/2015   Procedure: CYSTOSCOPY WITH BILATERAL URETEROSCOPY, STONE EXTRACTION WITH LASER  AND STENT PLACEMENT;  Surgeon: Bjorn Pippin, MD;  Location: [facility];  Service: Urology;  Laterality: Bilateral;  . ESOPHAGOGASTRODUODENOSCOPY (EGD) WITH PROPOFOL N/A 10/17/2015   Procedure: ESOPHAGOGASTRODUODENOSCOPY (EGD) WITH PROPOFOL;  Surgeon: Midge Minium, MD;  Location: [facility];  Service: Endoscopy;  Laterality: N/A;  . EXTRACORPOREAL SHOCK WAVE LITHOTRIPSY  Left 02-13-2015//  Right 01-17-2014  . HOLMIUM LASER APPLICATION Right 02/07/2014  Procedure: HOLMIUM LASER APPLICATION;  Surgeon: Anner Crete, MD;  Location: [facility];  Service: Urology;  Laterality: Right;  . HOLMIUM LASER  APPLICATION N/A 02/20/2015   Procedure: HOLMIUM LASER APPLICATION;  Surgeon: Bjorn Pippin, MD;  Location: [facility];  Service: Urology;  Laterality: N/A;  . LAPAROSCOPIC CHOLECYSTECTOMY SINGLE PORT N/A 04/19/2015   Procedure: LAPAROSCOPIC CHOLECYSTECTOMY;  Surgeon: Lattie Haw, MD;  Location: [facility];  Service: General;  Laterality: N/A;  . LEFT URETEROSCOPIC LASER LITHOTRIPSY STONE EXTRACTION W/ STENT PLACEMENT  03-09-2011;   09-18-2009  . LEFT URETEROSCOPIC STONE EXTRACTION W/ STENT  09-25-2009  . LUMBAR LAMINECTOMY  x2  ----  L3 - L5//  L5 -- S1  . POSTERIOR FUSION LUMBAR SPINE  09-08-2010   Re-do Laminectomy L3-S1 w/  Decompression and fusion   . RIGHT URETEROSCOPIC STONE EXTRACTION  03-13-2009  . TRANSTHORACIC ECHOCARDIOGRAM  07-04-2007   Mild focal basal septal hypertrophy/  ef 60%/  mild MVP involving anterior and posterior leaflets w/ trivial MR/  trivial pericardial effusion posterior to the heart    Allergies: Cheese, Codeine, Lactose intolerance (gi), Morphine and related, and Morphine and related  Medications: Prior to Admission medications   Medication Sig Start Date End Date Taking? Authorizing Provider  acetaminophen (TYLENOL) 500 MG tablet Take 2 tablets (1,000 mg total) by mouth every 8 (eight) hours as needed. 01/17/20   Maczis, Elmer Sow, PA-C  bacitracin ointment Apply topically 2 (two) times daily. Patient not taking: Reported on 04/23/2020 01/17/20   Maczis, Elmer Sow, PA-C  cyclobenzaprine (FLEXERIL) 10 MG tablet Take 1 tablet (10 mg total) by mouth 3 (three) times daily as needed for muscle spasms. Patient not taking: Reported on 01/24/2020 02/02/19   Tressie Stalker, MD  docusate sodium (COLACE) 100 MG capsule Take 1 capsule (100 mg total) by mouth 2 (two) times daily. Patient not taking: Reported on 05/06/2019 02/02/19   Tressie Stalker, MD  lidocaine (LIDODERM) 5 % Place 1 patch onto the skin daily as needed (pain). Remove & Discard patch within 12  hours or as directed by MD     [provider]  methocarbamol (ROBAXIN) 500 MG tablet Take 2 tablets (1,000 mg total) by mouth every 8 (eight) hours as needed for muscle spasms. 01/17/20   Maczis, Elmer Sow, PA-C  naproxen sodium (ALEVE) 220 MG tablet Take 440 mg by mouth daily as needed (pain).    [provider]  omeprazole (PRILOSEC) 20 MG capsule Take 20 mg by mouth daily.    [provider]  ondansetron (ZOFRAN) 8 MG tablet Take 8 mg by mouth every 8 (eight) hours as needed for nausea or vomiting.    [provider]  ondansetron (ZOFRAN-ODT) 4 MG disintegrating tablet Take 1 tablet (4 mg total) by mouth every 6 (six) hours as needed for nausea. Patient not taking: Reported on 04/23/2020 01/17/20   Jacinto Halim, PA-C  pantoprazole (PROTONIX) 20 MG tablet Take 20 mg by mouth daily.    [provider]  potassium citrate (UROCIT-K) 10 MEQ (1080 MG) SR tablet Take 10 mEq by mouth 3 (three) times daily. 09/16/18   [provider]  tamsulosin (FLOMAX) 0.4 MG CAPS capsule Take 0.8 mg by mouth daily. 12/18/19   [provider]     Family History  Problem Relation Age of Onset  . Colon cancer Mother   . Stroke Mother   . Cancer Mother   . Diabetes Mother   . Hypertension  Mother   . Hypertension Brother   . Colon cancer Brother        Pre-cancerous Polyps removed  . Kidney disease Brother        Chronic- secondary to Hypertension  . Stroke Father   . Thyroid disease Daughter   . Colon cancer Maternal Aunt        x 2  . Colon cancer Maternal Uncle     Social History   Socioeconomic History  . Marital status: Unknown    Spouse name: Not on file  . Number of children: Not on file  . Years of education: Not on file  . Highest education level: Not on file  Occupational History  . Not on file  Tobacco Use  . Smoking status: Former Smoker    Types: Cigarettes, Pipe    Quit date: 2001    Years since quitting: 20.7  .  Smokeless tobacco: Never Used  Vaping Use  . Vaping Use: Never used  Substance and Sexual Activity  . Alcohol use: Yes    Alcohol/week: 1.0 standard drink    Types: 1 Shots of liquor per week    Comment: occasionally   . Drug use: No  . Sexual activity: Not on file  Other Topics Concern  . Not on file  Social History Narrative   ** Merged History Encounter **       Social Determinants of Health   Financial Resource Strain:   . Difficulty of Paying Living Expenses: Not on file  Food Insecurity:   . Worried About Programme researcher, broadcasting/film/video in the Last Year: Not on file  . Ran Out of Food in the Last Year: Not on file  Transportation Needs:   . Lack of Transportation (Medical): Not on file  . Lack of Transportation (Non-Medical): Not on file  Physical Activity:   . Days of Exercise per Week: Not on file  . Minutes of Exercise per Session: Not on file  Stress:   . Feeling of Stress : Not on file  Social Connections:   . Frequency of Communication with Friends and Family: Not on file  . Frequency of Social Gatherings with Friends and Family: Not on file  . Attends Religious Services: Not on file  . Active Member of Clubs or Organizations: Not on file  . Attends Banker Meetings: Not on file  . Marital Status: Not on file     Review of Systems: A 12 point ROS discussed and pertinent positives are indicated in the HPI above.  All other systems are negative.  Review of Systems  Constitutional: Negative for chills and fever.  Respiratory: Negative for shortness of breath and wheezing.   Cardiovascular: Negative for chest pain and palpitations.  Gastrointestinal: Negative for abdominal pain.  Neurological: Negative for headaches.  Psychiatric/Behavioral: Negative for behavioral problems and confusion.    Vital Signs: There were no vitals taken for this visit.  Physical Exam Vitals and nursing note reviewed.  Constitutional:      General: He is not in acute  distress.    Appearance: Normal appearance.  Cardiovascular:     Rate and Rhythm: Normal rate and regular rhythm.     Heart sounds: Normal heart sounds. No murmur heard.   Pulmonary:     Effort: Pulmonary effort is normal. No respiratory distress.     Breath sounds: Normal breath sounds. No wheezing.  Skin:    General: Skin is warm and dry.  Neurological:  Mental Status: He is alert and oriented to person, place, and time.      MD Evaluation Airway: WNL Heart: WNL Abdomen: WNL Chest/ Lungs: WNL ASA  Classification: 3 Mallampati/Airway Score: One   Imaging: Cardiac event monitor  Result Date: 04/08/2020 Predominant rhythm sinus rhythm No atrial fibrillation noted Less than 1% PVCs Minimum heart rate 50 bpm, maximum heart rate 149 bpm No symptoms reported 2 nonsustained VT episodes, less than 4 beats, no other arrhythmias noted Will Camnitz, MD   Labs:  CBC: Recent Labs    01/29/20 0420 01/30/20 0456 05/02/20 1038 05/08/20 0823  WBC 9.6 9.6 5.4 5.3  HGB 10.9* 11.6* 15.8 15.3  HCT 33.8* 36.0* 48.7 47.2  PLT 318 376 166 182    COAGS: Recent Labs    01/14/20 1552 01/24/20 1522 01/24/20 1705 05/08/20 0823  INR 1.0 NOT CALCULATED 1.2 1.0  APTT  --   --  26  --     BMP: Recent Labs    01/28/20 0429 01/29/20 0420 01/30/20 0456 05/02/20 1038  NA 135 137 136 136  K 3.6 3.9 3.7 4.5  CL 102 102 103 102  CO2 22 23 24 27   GLUCOSE 92 109* 104* 100*  BUN 21 21 18 19   CALCIUM 8.5* 8.6* 9.0 9.3  CREATININE 1.37* 1.38* 1.31* 1.56*  GFRNONAA 53* 53* 56* 45*  GFRAA >60 >60 >60 52*    LIVER FUNCTION TESTS: Recent Labs    01/14/20 1552 01/24/20 1522 01/25/20 0442  BILITOT 0.8 0.8 1.2  AST 30 36 42*  ALT 20 38 34  ALKPHOS 138* 143* 111  PROT 6.7 6.4* 5.4*  ALBUMIN 3.2* 3.1* 2.5*     Assessment and Plan:  Left renal calculi with tentative plans for percutaneous nephrolithotomy today in OR with Dr. Annabell HowellsWrenn. Plan for image-guided left percutaneous  ureteral stent placement today in IR with Dr. Deanne CofferHassell prior to OR with Dr. Annabell HowellsWrenn. Patient is NPO. Afebrile and WBCs WNL. He does not take blood thinners. INR 1.0 today.  Risks and benefits of left percutaneous ureteral stent placement were discussed with the patient including, but not limited to, infection, bleeding, significant bleeding causing loss or decrease in renal function or damage to adjacent structures. All of the patient's questions were answered, patient is agreeable to proceed. Consent signed and in chart.   Thank you for this interesting consult.  I greatly enjoyed meeting Anthony LeffWilliam R [name] and look forward to participating in their care.  A copy of this report was sent to the requesting provider on this date.  Electronically Signed: Elwin MochaAlexandra Chukwuemeka Artola, PA-C 05/08/2020, 8:47 AM   I spent a total of 30 Minutes in face to face in clinical consultation, greater than 50% of which was counseling/coordinating care for left renal calculi/left ureteral stent placement.

## 2020-05-09 ENCOUNTER — Observation Stay (HOSPITAL_COMMUNITY): Payer: BC Managed Care – PPO

## 2020-05-09 ENCOUNTER — Encounter (HOSPITAL_COMMUNITY): Payer: Self-pay | Admitting: Urology

## 2020-05-09 DIAGNOSIS — N200 Calculus of kidney: Secondary | ICD-10-CM | POA: Diagnosis not present

## 2020-05-09 DIAGNOSIS — Z87891 Personal history of nicotine dependence: Secondary | ICD-10-CM | POA: Diagnosis not present

## 2020-05-09 DIAGNOSIS — Z79899 Other long term (current) drug therapy: Secondary | ICD-10-CM | POA: Diagnosis not present

## 2020-05-09 DIAGNOSIS — N1831 Chronic kidney disease, stage 3a: Secondary | ICD-10-CM | POA: Diagnosis not present

## 2020-05-09 LAB — HEMOGLOBIN AND HEMATOCRIT, BLOOD
HCT: 46 % (ref 39.0–52.0)
Hemoglobin: 15 g/dL (ref 13.0–17.0)

## 2020-05-09 LAB — SURGICAL PATHOLOGY

## 2020-05-09 NOTE — Discharge Summary (Signed)
Patient ID: Anthony [name] MRN: [id number] DOB/AGE: 1950-12-18 69 y.o.  Admit date: 05/08/2020 Discharge date: 05/09/2020  Primary Care Physician:  [name], MD  Discharge Diagnoses:  n20.0     Discharge Medications: Allergies as of 05/09/2020      Reactions   Cheese Other (See Comments)   Stomach upset , cramping.    Codeine Itching   Lactose Intolerance (gi) Other (See Comments)   GI distress   Morphine And Related Itching   Morphine And Related Other (See Comments)   Insomnia      Medication List    TAKE these medications   acetaminophen 500 MG tablet Commonly known as: TYLENOL Take 2 tablets (1,000 mg total) by mouth every 8 (eight) hours as needed.   bacitracin ointment Apply topically 2 (two) times daily.   lidocaine 5 % Commonly known as: LIDODERM Place 1 patch onto the skin daily as needed (pain). Remove & Discard patch within 12 hours or as directed by MD   methocarbamol 500 MG tablet Commonly known as: ROBAXIN Take 2 tablets (1,000 mg total) by mouth every 8 (eight) hours as needed for muscle spasms.   naproxen sodium 220 MG tablet Commonly known as: ALEVE Take 440 mg by mouth daily as needed (pain).   omeprazole 20 MG capsule Commonly known as: PRILOSEC Take 20 mg by mouth daily.   ondansetron 4 MG disintegrating tablet Commonly known as: ZOFRAN-ODT Take 1 tablet (4 mg total) by mouth every 6 (six) hours as needed for nausea.   ondansetron 8 MG tablet Commonly known as: ZOFRAN Take 8 mg by mouth every 8 (eight) hours as needed for nausea or vomiting.   pantoprazole 20 MG tablet Commonly known as: PROTONIX Take 20 mg by mouth daily.   potassium citrate 10 MEQ (1080 MG) SR tablet Commonly known as: UROCIT-K Take 10 mEq by mouth 3 (three) times daily.   tamsulosin 0.4 MG Caps capsule Commonly known as: FLOMAX Take 0.8 mg by mouth daily.            Discharge Care Instructions  (From admission, onward)         Start      Ordered   05/09/20 0000  Leave dressing on - Keep it clean, dry, and intact until clinic visit        05/09/20 0734   05/09/20 0000  Leave dressing on - Keep it clean, dry, and intact until clinic visit        05/09/20 0805           Significant Diagnostic Studies:  Abdomen 1 View (KUB)  Result Date: 05/09/2020 CLINICAL DATA:  Left nephrolithiasis EXAM: ABDOMEN - 1 VIEW COMPARISON:  04/03/2019 FINDINGS: Left nephroureteral catheter in place. No visible suspicious calcification. Nonobstructive bowel gas pattern. Prior cholecystectomy. IMPRESSION: Left nephroureteral catheter in place without visible suspicious calcification. Electronically Signed   By: Charlett Nose M.D.   On: 05/09/2020 05:58   DG C-Arm 1-60 Min-No Report  Result Date: 05/08/2020 Fluoroscopy was utilized by the requesting physician.  No radiographic interpretation.   IR URETERAL STENT LEFT NEW ACCESS W/O SEP NEPHROSTOMY CATH  Result Date: 05/08/2020 CLINICAL DATA:  Symptomatic bilateral nephrolithiasis, planned left percutaneous nephrolithotomy EXAM: LEFT PERCUTANEOUS NEPHROURETERAL CATHETER PLACEMENT x2 UNDER FLUOROSCOPIC GUIDANCE FLUOROSCOPY TIME:  4 minutes 24 seconds; 86 mGy TECHNIQUE: The procedure, risks (including but not limited to bleeding, infection, organ damage ), benefits, and alternatives were explained to the patient. Questions regarding the procedure were encouraged  and answered. The patient understands and consents to the procedure. Laterality was confirmed and marked. Left flank region prepped with Betadine, draped in usual sterile fashion, infiltrated locally with 1% lidocaine. Intravenous Fentanyl and Versed 4mg  were administered as conscious sedation during continuous monitoring of the patient's level of consciousness and physiological / cardiorespiratory status by the radiology RN, with a total moderate sedation time of 25 minutes. As antibiotic prophylaxis, cefazolin 2 g was ordered pre-procedure  and administered intravenously within one hour of incision. Under real-time fluoroscopic guidance, a 21-gauge trocar needle was advanced into a posterior left mid pole calyx using the peripheral radiodense calculus as a guide. A 018 guidewire advanced easily down the ureter. Needle was exchanged over a guidewire for transitional dilator. Contrast injection confirmed appropriate positioning. A small amount of gas was injected to demonstrate the posterior calices in the lower pole. In similar fashion, the 21 gauge trocar needle was advanced into posterior lower pole calyx under fluoroscopy. The 018 guidewire advanced centrally easily. The mid pole catheter was exchanged over a guidewire for a 5 Kumpe catheter, advanced into the urinary bladder. Subsequent the tract, the transitional dilator was advanced over the lower pole wire to allow placement of a 035 wire, over which a second 5 French angiographic catheter was advanced into the urinary bladder. Radiograph confirms appropriate nephroureteral catheter positioning x2. Catheters marked, capped and secured externally. The patient tolerated the procedure well. COMPLICATIONS: COMPLICATIONS None. IMPRESSION: 1. Technically successful left percutaneous nephroureteral catheter placement x2. Electronically Signed   By: [place] M.D.   On: 05/08/2020 15:21    Brief H and P: For complete details please refer to admission H and P, but in brief pt admitted for [facility] PCNL  Hospital Course: No postop problems following PCNL--discharge POD 1 Active Problems:   Left nephrolithiasis   Day of Discharge BP 131/79 (BP Location: Left Arm)   Pulse 76   Temp 98.1 F (36.7 C) (Oral)   Resp 20   Ht 5\' 10"  (1.778 m)   Wt 78.5 kg   SpO2 97%   BMI 24.82 kg/m   Results for orders placed or performed during the hospital encounter of 05/08/20 (from the past 24 hour(s))  Basic metabolic panel     Status: Abnormal   Collection Time: 05/08/20  8:23 AM  Result Value  Ref Range   Sodium 137 135 - 145 mmol/L   Potassium 3.9 3.5 - 5.1 mmol/L   Chloride 105 98 - 111 mmol/L   CO2 24 22 - 32 mmol/L   Glucose, Bld 102 (H) 70 - 99 mg/dL   BUN 19 8 - 23 mg/dL   Creatinine, Ser 05/10/20 (H) 0.61 - 1.24 mg/dL   Calcium 9.2 8.9 - 05/10/20 mg/dL   GFR calc non Af Amer 44 (L) >60 mL/min   GFR calc Af Amer 51 (L) >60 mL/min   Anion gap 8 5 - 15  CBC     Status: None   Collection Time: 05/08/20  8:23 AM  Result Value Ref Range   WBC 5.3 4.0 - 10.5 K/uL   RBC 5.40 4.22 - 5.81 MIL/uL   Hemoglobin 15.3 13.0 - 17.0 g/dL   HCT 94.1 39 - 52 %   MCV 87.4 80.0 - 100.0 fL   MCH 28.3 26.0 - 34.0 pg   MCHC 32.4 30.0 - 36.0 g/dL   RDW 05/10/20 74.0 - 81.4 %   Platelets 182 150 - 400 K/uL   nRBC 0.0 0.0 -  0.2 %  Protime-INR     Status: None   Collection Time: 05/08/20  8:23 AM  Result Value Ref Range   Prothrombin Time 12.7 11.4 - 15.2 seconds   INR 1.0 0.8 - 1.2  Hemoglobin and hematocrit, blood     Status: None   Collection Time: 05/08/20  1:20 PM  Result Value Ref Range   Hemoglobin 13.4 13.0 - 17.0 g/dL   HCT 93.8 39 - 52 %  Hemoglobin and hematocrit, blood     Status: None   Collection Time: 05/09/20  5:22 AM  Result Value Ref Range   Hemoglobin 15.0 13.0 - 17.0 g/dL   HCT 10.1 39 - 52 %    Physical Exam: General: Alert and awake oriented x3 not in any acute distress. HEENT: anicteric sclera, pupils reactive to light and accommodation CVS: S1-S2 clear no murmur rubs or gallops Chest: clear to auscultation bilaterally, no wheezing rales or rhonchi Abdomen: soft nontender, nondistended, normal bowel sounds, no organomegaly Extremities: no cyanosis, clubbing or edema noted bilaterally Neuro: Cranial nerves II-XII intact, no focal neurological deficits  Disposition:  Home  Diet:  Regular  Activity:  Discussed w/ pt   TESTS THAT NEED FOLLOW-UP   N/A  DISCHARGE FOLLOW-UP    Time spent on Discharge:   10 mins  Signed: Bertram Millard Marsheila Alejo 05/09/2020,  8:05 AM

## 2020-05-09 NOTE — Plan of Care (Signed)
No signs or symptoms of sepsis this hospital visit.  Problem: Education: Goal: Knowledge of General Education information will improve Description: Including pain rating scale, medication(s)/side effects and non-pharmacologic comfort measures Outcome: Adequate for Discharge   Problem: Health Behavior/Discharge Planning: Goal: Ability to manage health-related needs will improve Outcome: Adequate for Discharge   Problem: Clinical Measurements: Goal: Ability to maintain clinical measurements within normal limits will improve Outcome: Adequate for Discharge Goal: Will remain free from infection Outcome: Adequate for Discharge Goal: Diagnostic test results will improve Outcome: Adequate for Discharge Goal: Respiratory complications will improve Outcome: Adequate for Discharge Goal: Cardiovascular complication will be avoided Outcome: Adequate for Discharge   Problem: Activity: Goal: Risk for activity intolerance will decrease Outcome: Adequate for Discharge   Problem: Nutrition: Goal: Adequate nutrition will be maintained Outcome: Adequate for Discharge   Problem: Coping: Goal: Level of anxiety will decrease Outcome: Adequate for Discharge   Problem: Elimination: Goal: Will not experience complications related to bowel motility Outcome: Adequate for Discharge Goal: Will not experience complications related to urinary retention Outcome: Adequate for Discharge   Problem: Pain Managment: Goal: General experience of comfort will improve Outcome: Adequate for Discharge   Problem: Safety: Goal: Ability to remain free from injury will improve Outcome: Adequate for Discharge   Problem: Skin Integrity: Goal: Risk for impaired skin integrity will decrease Outcome: Adequate for Discharge

## 2020-05-12 ENCOUNTER — Other Ambulatory Visit: Payer: Self-pay | Admitting: Neurosurgery

## 2020-05-12 DIAGNOSIS — M5412 Radiculopathy, cervical region: Secondary | ICD-10-CM

## 2020-05-12 DIAGNOSIS — M546 Pain in thoracic spine: Secondary | ICD-10-CM

## 2020-05-12 DIAGNOSIS — N200 Calculus of kidney: Secondary | ICD-10-CM | POA: Diagnosis not present

## 2020-05-14 LAB — CALCULI, WITH PHOTOGRAPH (CLINICAL LAB)
Calcium Oxalate Dihydrate: 40 %
Calcium Oxalate Monohydrate: 40 %
Hydroxyapatite: 20 %
Weight Calculi: 294 mg

## 2020-05-21 ENCOUNTER — Other Ambulatory Visit (HOSPITAL_COMMUNITY): Payer: Self-pay | Admitting: Urology

## 2020-05-21 DIAGNOSIS — N200 Calculus of kidney: Secondary | ICD-10-CM

## 2020-06-03 ENCOUNTER — Ambulatory Visit
Admission: RE | Admit: 2020-06-03 | Discharge: 2020-06-03 | Disposition: A | Discharge status: Home / Self Care | Payer: Worker's Compensation | Source: Ambulatory Visit | Attending: Neurosurgery | Admitting: Neurosurgery

## 2020-06-03 ENCOUNTER — Other Ambulatory Visit: Payer: Self-pay

## 2020-06-03 DIAGNOSIS — M5412 Radiculopathy, cervical region: Secondary | ICD-10-CM

## 2020-06-03 DIAGNOSIS — M546 Pain in thoracic spine: Secondary | ICD-10-CM

## 2020-06-17 NOTE — Patient Instructions (Addendum)
DUE TO COVID-19 ONLY ONE VISITOR IS ALLOWED TO COME WITH YOU AND STAY IN THE WAITING ROOM ONLY DURING PRE OP AND PROCEDURE DAY OF SURGERY. THE 1 VISITOR  MAY VISIT WITH YOU AFTER SURGERY IN YOUR PRIVATE ROOM DURING VISITING HOURS ONLY!  YOU NEED TO HAVE A COVID 19 TEST ON: 06/20/20 @ 1:00 AM, THIS TEST MUST BE DONE BEFORE SURGERY,  COVID TESTING SITE [address] [place] [address], IT IS ON THE RIGHT GOING OUT [address] APPROXIMATELY  2 MINUTES PAST [employer] ON THE RIGHT. ONCE YOUR COVID TEST IS COMPLETED,  PLEASE BEGIN THE QUARANTINE INSTRUCTIONS AS OUTLINED IN YOUR HANDOUT.                Anthony [name]    Your procedure is scheduled on: 06/24/20   Report to [facility] Main  Entrance   Report to admitting at: 8:00 AM     Call this number if you have problems the morning of surgery [phone]    Remember: Do not eat solid food :After Midnight. Clear liquids from midnight until: 7:00 am  CLEAR LIQUID DIET   Foods Allowed                                                                     Foods Excluded  Coffee and tea, regular and decaf                             liquids that you cannot  Plain Jell-O any favor except red or purple                                           see through such as: Fruit ices (not with fruit pulp)                                     milk, soups, orange juice  Iced Popsicles                                    All solid food Carbonated beverages, regular and diet                                    Cranberry, grape and apple juices Sports drinks like Gatorade Lightly seasoned clear broth or consume(fat free) Sugar, honey syrup  Sample Menu Breakfast                                Lunch                                     Supper Cranberry juice  Beef broth                            Chicken broth Jell-O                                     Grape juice                           Apple juice Coffee  or tea                        Jell-O                                      Popsicle                                                Coffee or tea                        Coffee or tea  _____________________________________________________________________  BRUSH YOUR TEETH MORNING OF SURGERY AND RINSE YOUR MOUTH OUT, NO CHEWING GUM CANDY OR MINTS.     Take these medicines the morning of surgery with A SIP OF WATER: tamsulosin               You may not have any metal on your body including hair pins and              piercings  Do not wear jewelry, lotions, powders or perfumes, deodorant             Men may shave face and neck.   Do not bring valuables to the hospital. [facility] IS NOT             RESPONSIBLE   FOR VALUABLES.  Contacts, dentures or bridgework may not be worn into surgery.  Leave suitcase in the car. After surgery it may be brought to your room.     Patients discharged the day of surgery will not be allowed to drive home. IF YOU ARE HAVING SURGERY AND GOING HOME THE SAME DAY, YOU MUST HAVE AN ADULT TO DRIVE YOU HOME AND BE WITH YOU FOR 24 HOURS. YOU MAY GO HOME BY TAXI OR UBER OR ORTHERWISE, BUT AN ADULT MUST ACCOMPANY YOU HOME AND STAY WITH YOU FOR 24 HOURS.  Name and phone number of your driver:  Special Instructions: N/A              Please read over the following fact sheets you were given: _____________________________________________________________________          [facility] - Preparing for Surgery Before surgery, you can play an important role.  Because skin is not sterile, your skin needs to be as free of germs as possible.  You can reduce the number of germs on your skin by washing with CHG (chlorahexidine gluconate) soap before surgery.  CHG is an antiseptic cleaner which kills germs and bonds with the skin to continue killing germs even after washing. Please DO NOT use if you have an allergy to CHG or antibacterial  soaps.  If your skin becomes reddened/irritated  stop using the CHG and inform your nurse when you arrive at Short Stay. Do not shave (including legs and underarms) for at least 48 hours prior to the first CHG shower.  You may shave your face/neck. Please follow these instructions carefully:  1.  Shower with CHG Soap the night before surgery and the  morning of Surgery.  2.  If you choose to wash your hair, wash your hair first as usual with your  normal  shampoo.  3.  After you shampoo, rinse your hair and body thoroughly to remove the  shampoo.                           4.  Use CHG as you would any other liquid soap.  You can apply chg directly  to the skin and wash                       Gently with a scrungie or clean washcloth.  5.  Apply the CHG Soap to your body ONLY FROM THE NECK DOWN.   Do not use on face/ open                           Wound or open sores. Avoid contact with eyes, ears mouth and genitals (private parts).                       Wash face,  Genitals (private parts) with your normal soap.             6.  Wash thoroughly, paying special attention to the area where your surgery  will be performed.  7.  Thoroughly rinse your body with warm water from the neck down.  8.  DO NOT shower/wash with your normal soap after using and rinsing off  the CHG Soap.                9.  Pat yourself dry with a clean towel.            10.  Wear clean pajamas.            11.  Place clean sheets on your bed the night of your first shower and do not  sleep with pets. Day of Surgery : Do not apply any lotions/deodorants the morning of surgery.  Please wear clean clothes to the hospital/surgery center.  FAILURE TO FOLLOW THESE INSTRUCTIONS MAY RESULT IN THE CANCELLATION OF YOUR SURGERY PATIENT SIGNATURE_________________________________  NURSE SIGNATURE__________________________________  ________________________________________________________________________

## 2020-06-17 NOTE — Progress Notes (Signed)
Pt. Needs orders for upcoming surgery.PST and lab. appointment on 06/18/20.

## 2020-06-18 ENCOUNTER — Encounter (HOSPITAL_COMMUNITY): Payer: Self-pay

## 2020-06-18 ENCOUNTER — Encounter (HOSPITAL_COMMUNITY)
Admission: RE | Admit: 2020-06-18 | Discharge: 2020-06-18 | Disposition: A | Discharge status: Home / Self Care | Payer: BC Managed Care – PPO | Source: Ambulatory Visit | Attending: Urology | Admitting: Urology

## 2020-06-18 ENCOUNTER — Other Ambulatory Visit: Payer: Self-pay

## 2020-06-18 DIAGNOSIS — Z01818 Encounter for other preprocedural examination: Secondary | ICD-10-CM | POA: Insufficient documentation

## 2020-06-18 LAB — BASIC METABOLIC PANEL
Anion gap: 9 (ref 5–15)
BUN: 23 mg/dL (ref 8–23)
CO2: 28 mmol/L (ref 22–32)
Calcium: 9.6 mg/dL (ref 8.9–10.3)
Chloride: 103 mmol/L (ref 98–111)
Creatinine, Ser: 1.77 mg/dL — ABNORMAL HIGH (ref 0.61–1.24)
GFR, Estimated: 41 mL/min — ABNORMAL LOW (ref >60)
Glucose, Bld: 70 mg/dL (ref 70–99)
Potassium: 4.7 mmol/L (ref 3.5–5.1)
Sodium: 140 mmol/L (ref 135–145)

## 2020-06-18 LAB — CBC
HCT: 46.5 % (ref 39.0–52.0)
Hemoglobin: 14.7 g/dL (ref 13.0–17.0)
MCH: 28.2 pg (ref 26.0–34.0)
MCHC: 31.6 g/dL (ref 30.0–36.0)
MCV: 89.1 fL (ref 80.0–100.0)
Platelets: 181 10*3/uL (ref 150–400)
RBC: 5.22 MIL/uL (ref 4.22–5.81)
RDW: 14 % (ref 11.5–15.5)
WBC: 6.8 10*3/uL (ref 4.0–10.5)
nRBC: 0 % (ref 0.0–0.2)

## 2020-06-18 NOTE — Progress Notes (Signed)
Lab. Results: Creatinine: 1.77

## 2020-06-18 NOTE — Progress Notes (Addendum)
COVID Vaccine Completed: NO Date COVID Vaccine completed: COVID vaccine manufacturer: Pf[employer]    [facility] [employer] [employer]   PCP - Dr. Frances Maywood Cardiologist - NO.  Chest x-ray - 01/24/20 EKG - 01/16/20 Stress Test -  ECHO - 01/15/20 Cardiac Cath -  Pacemaker/ICD device last checked:  Sleep Study -  CPAP -   Fasting Blood Sugar -  Checks Blood Sugar _____ times a day  Blood Thinner Instructions: Aspirin Instructions: Last Dose:  Anesthesia review: Hx: Mitral valve prolapse.  Patient denies shortness of breath, fever, cough and chest pain at PAT appointment   Patient verbalized understanding of instructions that were given to them at the PAT appointment. Patient was also instructed that they will need to review over the PAT instructions again at home before surgery.

## 2020-06-20 ENCOUNTER — Other Ambulatory Visit: Payer: Self-pay | Admitting: Radiology

## 2020-06-20 ENCOUNTER — Other Ambulatory Visit (HOSPITAL_COMMUNITY)
Admission: RE | Admit: 2020-06-20 | Discharge: 2020-06-20 | Disposition: A | Discharge status: Home / Self Care | Payer: BC Managed Care – PPO | Source: Ambulatory Visit | Attending: Urology | Admitting: Urology

## 2020-06-20 DIAGNOSIS — Z20822 Contact with and (suspected) exposure to covid-19: Secondary | ICD-10-CM | POA: Insufficient documentation

## 2020-06-20 DIAGNOSIS — Z01812 Encounter for preprocedural laboratory examination: Secondary | ICD-10-CM | POA: Insufficient documentation

## 2020-06-21 LAB — SARS CORONAVIRUS 2 (TAT 6-24 HRS): SARS Coronavirus 2: NEGATIVE

## 2020-06-23 ENCOUNTER — Ambulatory Visit (HOSPITAL_COMMUNITY)
Admission: RE | Admit: 2020-06-23 | Discharge: 2020-06-23 | Disposition: A | Discharge status: Home / Self Care | Payer: BC Managed Care – PPO | Source: Ambulatory Visit | Attending: Urology | Admitting: Urology

## 2020-06-23 ENCOUNTER — Other Ambulatory Visit: Payer: Self-pay | Admitting: Urology

## 2020-06-23 ENCOUNTER — Ambulatory Visit (HOSPITAL_COMMUNITY)
Admission: RE | Admit: 2020-06-23 | Discharge: 2020-06-23 | Disposition: A | Discharge status: Home / Self Care | Payer: BC Managed Care – PPO | Source: Ambulatory Visit | Attending: Family Medicine | Admitting: Family Medicine

## 2020-06-23 ENCOUNTER — Encounter (HOSPITAL_COMMUNITY): Payer: Self-pay

## 2020-06-23 ENCOUNTER — Other Ambulatory Visit: Payer: Self-pay

## 2020-06-23 DIAGNOSIS — N200 Calculus of kidney: Secondary | ICD-10-CM | POA: Insufficient documentation

## 2020-06-23 HISTORY — PX: IR URETERAL STENT RIGHT NEW ACCESS W/O SEP NEPHROSTOMY CATH: IMG6076

## 2020-06-23 LAB — PROTIME-INR
INR: 1 (ref 0.8–1.2)
Prothrombin Time: 12.4 seconds (ref 11.4–15.2)

## 2020-06-23 MED ORDER — MIDAZOLAM HCL 2 MG/2ML IJ SOLN
INTRAMUSCULAR | Status: AC | PRN
  Administered 2020-06-23 (×4): 1 mg via INTRAVENOUS

## 2020-06-23 MED ORDER — IOHEXOL 300 MG/ML  SOLN
50.0000 mL | Freq: Once | INTRAMUSCULAR | Status: AC | PRN
  Administered 2020-06-23: 20 mL

## 2020-06-23 MED ORDER — SODIUM CHLORIDE 0.9 % IV SOLN: INTRAVENOUS | Status: DC

## 2020-06-23 MED ORDER — CIPROFLOXACIN IN D5W 400 MG/200ML IV SOLN: 400.0000 mg | Freq: Once | INTRAVENOUS | Status: AC

## 2020-06-23 MED ORDER — FENTANYL CITRATE (PF) 100 MCG/2ML IJ SOLN
25.0000 ug | INTRAMUSCULAR | Status: AC
  Administered 2020-06-23: 25 ug via INTRAVENOUS
  Filled 2020-06-23: qty 2

## 2020-06-23 MED ORDER — FENTANYL CITRATE (PF) 100 MCG/2ML IJ SOLN
INTRAMUSCULAR | Status: AC
  Filled 2020-06-23: qty 4

## 2020-06-23 MED ORDER — LIDOCAINE HCL (PF) 1 % IJ SOLN
INTRAMUSCULAR | Status: AC | PRN
  Administered 2020-06-23 (×2): 10 mL

## 2020-06-23 MED ORDER — MIDAZOLAM HCL 2 MG/2ML IJ SOLN
INTRAMUSCULAR | Status: AC
  Filled 2020-06-23: qty 4

## 2020-06-23 MED ORDER — FENTANYL CITRATE (PF) 100 MCG/2ML IJ SOLN
INTRAMUSCULAR | Status: AC | PRN
Start: 2020-06-23 — End: 2020-06-23
  Administered 2020-06-23 (×2): 50 ug via INTRAVENOUS

## 2020-06-23 MED ORDER — CIPROFLOXACIN IN D5W 400 MG/200ML IV SOLN
INTRAVENOUS | Status: AC
  Administered 2020-06-23: 400 mg via INTRAVENOUS
  Filled 2020-06-23: qty 200

## 2020-06-23 MED ORDER — LIDOCAINE HCL 1 % IJ SOLN
INTRAMUSCULAR | Status: AC
  Filled 2020-06-23: qty 20

## 2020-06-23 NOTE — Discharge Instructions (Signed)
Please call Interventional Radiology clinic [phone] with any questions or concerns.  Leave dressing intact. Do not change dressing.  Percutaneous Nephrostomy, Care After This sheet gives you information about how to care for yourself after your procedure. Your health care provider may also give you more specific instructions. If you have problems or questions, contact your health care provider. What can I expect after the procedure? After the procedure, it is common to have:  Some soreness where the nephrostomy tube was inserted (tube insertion site).  Blood-tinged drainage from the nephrostomy tube for the first 24 hours. Follow these instructions at home: Activity  Return to your normal activities as told by your health care provider. Ask your health care provider what activities are safe for you.  Avoid activities that may cause the nephrostomy tubing to bend.  Do not take baths, swim, or use a hot tub until your health care provider approves. Ask your health care provider if you can take showers. Cover the nephrostomy tube dressing with a watertight covering when you take a shower.  Donot drive for 24 hours if you were given a medicine to help you relax (sedative). Care of the tube insertion site   Follow instructions from your health care provider about how to take care of your tube insertion site. Make sure you: ? Wash your hands with soap and water before you change your bandage (dressing). If soap and water are not available, use hand sanitizer. ? Change your dressing as told by your health care provider. Be careful not to pull on the tube while removing the dressing. ? When you change the dressing, wash the skin around the tube, rinse well, and pat the skin dry.  Check the tube insertion area every day for signs of infection. Check for: ? More redness, swelling, or pain. ? More fluid or blood. ? Warmth. ? Pus or a bad smell. Care of the nephrostomy tube and drainage  bag  Always keep the tubing, the leg bag, or the bedside drainage bags below the level of the kidney so that your urine drains freely.  When connecting your nephrostomy tube to a drainage bag, make sure that there are no kinks in the tubing and that your urine is draining freely. You may want to use an elastic bandage to wrap any exposed tubing that goes from the nephrostomy tube to any of the connecting tubes.  At night, you may want to connect your nephrostomy tube or the leg bag to a larger bedside drainage bag.  Follow instructions from your health care provider about how to empty or change the drainage bag.  Empty the drainage bag when it becomes ? full.  Replace the drainage bag and any extension tubing that is connected to your nephrostomy tube every 3 weeks or as often as told by your health care provider. Your health care provider will explain how to change the drainage bag and extension tubing. General instructions  Take over-the-counter and prescription medicines only as told by your health care provider.  Keep all follow-up visits as told by your health care provider. This is important. Contact a health care provider if:  You have problems with any of the valves or tubing.  You have persistent pain or soreness in your back.  You have more redness, swelling, or pain around your tube insertion site.  You have more fluid or blood coming from your tube insertion site.  Your tube insertion site feels warm to the touch.  You have  pus or a bad smell coming from your tube insertion site.  You have increased urine output or you feel burning when urinating. Get help right away if:  You have pain in your abdomen during the first week.  You have chest pain or have trouble breathing.  You have a new appearance of blood in your urine.  You have a fever or chills.  You have back pain that is not relieved by your medicine.  You have decreased urine output.  Your nephrostomy  tube comes out. This information is not intended to replace advice given to you by your health care provider. Make sure you discuss any questions you have with your health care provider. Document Revised: 07/22/2017 Document Reviewed: 05/21/2016 Elsevier Patient Education  2020 [employer]   Moderate Conscious Sedation, Adult, Care After These instructions provide you with information about caring for yourself after your procedure. Your health care provider may also give you more specific instructions. Your treatment has been planned according to current medical practices, but problems sometimes occur. Call your health care provider if you have any problems or questions after your procedure. What can I expect after the procedure? After your procedure, it is common:  To feel sleepy for several hours.  To feel clumsy and have poor balance for several hours.  To have poor judgment for several hours.  To vomit if you eat too soon. Follow these instructions at home: For at least 24 hours after the procedure:   Do not: ? Participate in activities where you could fall or become injured. ? Drive. ? Use heavy machinery. ? Drink alcohol. ? Take sleeping pills or medicines that cause drowsiness. ? Make important decisions or sign legal documents. ? Take care of children on your own.  Rest. Eating and drinking  Follow the diet recommended by your health care provider.  If you vomit: ? Drink water, juice, or soup when you can drink without vomiting. ? Make sure you have little or no nausea before eating solid foods. General instructions  Have a responsible adult stay with you until you are awake and alert.  Take over-the-counter and prescription medicines only as told by your health care provider.  If you smoke, do not smoke without supervision.  Keep all follow-up visits as told by your health care provider. This is important. Contact a health care provider if:  You keep  feeling nauseous or you keep vomiting.  You feel light-headed.  You develop a rash.  You have a fever. Get help right away if:  You have trouble breathing. This information is not intended to replace advice given to you by your health care provider. Make sure you discuss any questions you have with your health care provider. Document Revised: 07/22/2017 Document Reviewed: 11/29/2015 Elsevier Patient Education  2020 [employer].

## 2020-06-23 NOTE — H&P (Signed)
05/12/2020: The patient returns today for removal of nephrostomy tube and ureteral catheter after undergoing uncomplicated left PCNL to treat a partially staghorn, greater than 2 cm, left upper pole calculus on 09/16. Patient has done well in the postoperative period. He has had no significant left-sided pain or discomfort. Voiding at his baseline continuing tamsulosin with stable symptomatology. The nephrostomy catheter has been draining appropriately mostly clear with some occasional blood. He has had some mild leaking around the catheter site resulting in some skin irritation but no excessive bleeding per his report. Urine output has been appropriate. He denies postprocedure fevers or chills, nausea/vomiting.     ALLERGIES: Codeine Derivatives Doxycycline Hyclate CAPS    MEDICATIONS: Omeprazole 40 mg capsule,delayed release  Potassium Citrate Er 10 meq (1,080 mg) tablet, extended release 1 tablet PO TID  Tamsulosin Hcl 0.4 mg capsule 2 capsule PO Daily     GU PSH: Cysto Uretero Lithotripsy - 2016, 2015, 2015, 2011 Cystoscopy Insert Stent - 2016, 2016, 2015, 2015, 2011, 2010 Cystoscopy Ureteroscopy - 2010 ESWL - 2016, 2015, 2011, 2011 Ureteroscopic stone removal - 2016, 2010       PSH Notes: Cholecystectomy, Cystoscopy With Insertion Of Ureteral Stent Left, Cystoscopy With Ureteroscopy With Lithotripsy, Cystoscopy With Ureteroscopy With Removal Of Calculus, Lithotripsy, Cystoscopy With Insertion Of Ureteral Stent Bilateral, Cystoscopy With Ureteroscopy With Lithotripsy, Cystoscopy With Insertion Of Ureteral Stent Right, Cystoscopy With Insertion Of Ureteral Stent Right, Cystoscopy With Ureteroscopy With Lithotripsy, Lithotripsy, Back Surgery, Cystoscopy With Insertion Of Ureteral Stent Left, Lithotripsy, Cystoscopy With Ureteroscopy With Lithotripsy, Lithotripsy, Cystoscopy With Ureteroscopy With Removal Of Calculus, Cystoscopy With Ureteroscopy Right, Cystoscopy With Insertion Of Ureteral  Stent Right, Neck Surgery, Arm Incision, Lower Back Surgery   NON-GU PSH: Cholecystectomy (open) - 2016     GU PMH: BPH w/LUTS, He is content with the tamsulosin and will continue that. - 04/02/2020, (Worsening), He has post op retention with overflow incontinence. I am going to leave the foley for a week and have him double the tamsulosin. he will return for a voiding trial. , - 02/07/2019, He is doing well on tamsulosin. he will return in year with a PSA. , - 12/27/2018, He continues to void well on tamsulosin. , - 2019 Acute Cystitis/UTI - 03/06/2019 Urinary Retention - 02/14/2019, - 2019 Renal calculus, He has stable stones. KUB in 1 year. - 12/27/2018, His stone is stable. he will continue the potassium citrate. BMP in 6 months. , - 06/26/2018 (Chronic), Bilateral, He passes 1-2 small stones every year without much difficulty., - 2019, Bilateral kidney stones, - 2016, Kidney stone on left side, - 2016, Kidney stone on right side, - 2015 Nocturia - 2019 Encounter for Prostate Cancer screening, Prostate cancer screening - 2016 Abdominal Pain Unspec, Right flank pain - 2016 Personal Hx Oth Urinary System diseases, History of acute renal failure - 2016 Hydronephrosis Unspec, Hydronephrosis, right - 2016, Hydronephrosis, left, - 2016, Hydronephrosis, bilateral, - 2016 Ureteral calculus, Ureteral calculus, left - 2016, Ureteral calculus, right, - 2016, Calculus of right ureter, - 2015, Ureteral Stone, - 2014, Proximal Ureteral Stone On The Right, - 2014 Urinary Tract Inf, Unspec site, Urinary tract infection - 2016 Other microscopic hematuria, Microscopic hematuria - 2016 RLQ pain, Abdominal pain, RLQ (right lower quadrant) - 2016 History of urolithiasis, Nephrolithiasis - 2014 Personal Hx Urinary Tract Infections, History of acute pyelonephritis - 2014 Ureteral obstruction, Hydronephrosis with ureteral stricture, not elsewhere classified - 2014 Ureteral stricture, Ureteral Stricture - 2014      PMH  Notes:  2010-01-16 10:09:33 - Note: Hematoma Of The Left Kidney  2009-02-28 11:46:15 - Note: Arthritis   NON-GU PMH: Encounter for general adult medical examination without abnormal findings, Encounter for preventive health examination - 2016 Nausea with vomiting, unspecified, Nausea and vomiting - 2016 Nausea, Nausea - 2015 Radiculopathy, lumbar region, Lumbar Radiculopathy - 2014    FAMILY HISTORY: Colon Cancer - Mother, Aunt, Aunt, Uncle Death In The Family Aunt - Aunt, Aunt Death In The Family Father - Runs In Family Death In The Family Mother - Runs In [facility] Family Health Status Number - Runs In Family Hematuria - Mother nephrolithiasis - Mother Stroke Syndrome - Mother, Father   SOCIAL HISTORY: Marital Status: Widowed Preferred Language: English Current Smoking Status: Patient has never smoked.   Tobacco Use Assessment Completed: Used Tobacco in last 30 days? Drinks 1 drink per month. Types of alcohol consumed: Liquor. Social Drinker.  Drinks 4+ caffeinated drinks per day.     Notes: Occupation:, Previous History Of Smoking, Being A Social Drinker, Caffeine Use, Marital History - Widowed   REVIEW OF SYSTEMS:    GU Review Male:   Patient denies frequent urination, hard to postpone urination, burning/ pain with urination, get up at night to urinate, leakage of urine, stream starts and stops, trouble starting your stream, have to strain to urinate , erection problems, and penile pain.  Gastrointestinal (Upper):   Patient denies nausea, vomiting, and indigestion/ heartburn.  Gastrointestinal (Lower):   Patient denies diarrhea and constipation.  Constitutional:   Patient denies fever, night sweats, weight loss, and fatigue.  Skin:   Patient denies skin rash/ lesion and itching.  Eyes:   Patient denies blurred vision and double vision.  Ears/ Nose/ Throat:   Patient denies sore throat and sinus problems.  Hematologic/Lymphatic:   Patient denies swollen glands and easy bruising.   Cardiovascular:   Patient denies leg swelling and chest pains.  Respiratory:   Patient denies cough and shortness of breath.  Endocrine:   Patient denies excessive thirst.  Musculoskeletal:   Patient denies joint pain and back pain.  Neurological:   Patient denies headaches and dizziness.  Psychologic:   Patient denies depression and anxiety.   VITAL SIGNS:      05/12/2020 08:39 AM  Weight 175 lb / 79.38 kg  Height 70 in / 177.8 cm  BP 121/72 mmHg  Pulse 78 /min  Temperature 97.3 F / 36.2 C  BMI 25.1 kg/m   GU PHYSICAL EXAMINATION:      Notes: Retention sutures cut and removed from his nephrostomy catheter. Balloon was deflated and catheter removed with an intact ureteral stent also noted at time of removal. A pressure dressing was placed over the affected area. There were no complications from the procedure.   MULTI-SYSTEM PHYSICAL EXAMINATION:    Constitutional: Well-nourished. No physical deformities. Normally developed. Good grooming.  Neck: Neck symmetrical, not swollen. Normal tracheal position.  Respiratory: No labored breathing, no use of accessory muscles.   Cardiovascular: Normal temperature, normal extremity pulses, no swelling, no varicosities.  Neurologic / Psychiatric: Oriented to time, oriented to place, oriented to person. No depression, no anxiety, no agitation.  Gastrointestinal: No mass, no tenderness, no rigidity, non obese abdomen. Nephrostomy catheter sutured in place draining grossly clear to light pink urine. There was no palpable lower back or flank tenderness. Site grossly unremarkable that there is some surrounding maceration of the skin consistent with a diaper rash due to overexposure to liquid.  Musculoskeletal: Normal gait and  station of head and neck.     Complexity of Data:  Source Of History:  Patient, Medical Record Summary  Records Review:   Previous Doctor Records, Previous Hospital Records, Previous Patient Records  Urine Test Review:    Urinalysis, Urine Culture  X-Ray Review: C.T. Abdomen/Pelvis: Reviewed Films. Reviewed Report.     12/20/18 05/22/15  PSA  Total PSA 0.71 ng/mL 1.05     05/12/20  Urinalysis  Urine Appearance Slightly Cloudy   Urine Color Yellow   Urine Glucose Neg mg/dL  Urine Bilirubin Neg mg/dL  Urine Ketones Neg mg/dL  Urine Specific Gravity 1.030   Urine Blood 3+ ery/uL  Urine pH 6.0   Urine Protein 2+ mg/dL  Urine Urobilinogen 0.2 mg/dL  Urine Nitrites Neg   Urine Leukocyte Esterase Trace leu/uL  Urine WBC/hpf NS (Not Seen)   Urine RBC/hpf 40 - 60/hpf   Urine Epithelial Cells NS (Not Seen)   Urine Bacteria NS (Not Seen)   Urine Mucous Present   Urine Yeast NS (Not Seen)   Urine Trichomonas Not Present   Urine Cystals NS (Not Seen)   Urine Casts Hyaline   Urine Sperm Not Present    PROCEDURES:          Urinalysis w/Scope Dipstick Dipstick Cont'd Micro  Color: Yellow Bilirubin: Neg mg/dL WBC/hpf: NS (Not Seen)  Appearance: Slightly Cloudy Ketones: Neg mg/dL RBC/hpf: 40 - 78/HYI  Specific Gravity: 1.030 Blood: 3+ ery/uL Bacteria: NS (Not Seen)  pH: 6.0 Protein: 2+ mg/dL Cystals: NS (Not Seen)  Glucose: Neg mg/dL Urobilinogen: 0.2 mg/dL Casts: Hyaline    Nitrites: Neg Trichomonas: Not Present    Leukocyte Esterase: Trace leu/uL Mucous: Present      Epithelial Cells: NS (Not Seen)      Yeast: NS (Not Seen)      Sperm: Not Present    ASSESSMENT:      ICD-10 Details  1 GU:   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 Uncomplicated removal of his nephrostomy catheter and ureteral stent. He has a known similar sized calculus on the right side with plans to repeat PCNL in the next few weeks.   PLAN:           Orders Labs Urine Culture          Schedule Return Visit/Planned Activity: Next Available Appointment - Schedule Surgery, Follow up MD             Note: cancel f/u on 09/29          Document Letter(s):  Created for  Patient: Clinical Summary         Notes:   Patient has done well since time of his most recent left-sided PCNL. Uncomplicated removal of nephrostomy catheter in ureteral stent today. We discussed ongoing wound care as well as management of pain/discomfort and expected irritative voiding symptoms. A precautionary urine culture sent today. I did recommend using some Desitin on the skin surrounding his nephrostomy catheter site which will resolve the noted rash. I'll send a message to his urologist about scheduling in the near future regarding repeat right-sided PCNL for a known 2.5cm stone. Appropriate return to clinic instructions discussed in detail for worsening symptomatology including recurrence of unilateral pain/discomfort, new or worsening lower urinary tract symptoms suggestive of underlying infectious process or urinary retention, constitutional signs/symptoms of systemic infection.

## 2020-06-23 NOTE — Procedures (Signed)
Interventional Radiology Procedure Note  Procedure: Right percutaneous renal access with ureteral catheter placement  Complications: None  Estimated Blood Loss: < 10 mL  Findings: Via LP access, 5 Fr catheter advanced beyond calculus to level of bladder. Access secured for PCNL tomorrow.  Jodi Marble. Fredia Sorrow, M.D Pager:  [phone]

## 2020-06-23 NOTE — H&P (Signed)
Referring Physician(s): Bjorn Pippin  Supervising Physician: Irish Lack  Patient Status:  [facility]   Chief Complaint:  Kidney stones  Subjective: Patient familiar to IR service from left nephroureteral catheter placement x2 on 05/08/2020.  He subsequently underwent left PCNL by Dr. Annabell Howells same day.  He has a history of recurrent nephrolithiasis with previous imaging revealing a 2.6 cm left upper pole partial staghorn stone and slightly smaller right lower pole partial staghorn stone.  He presents again today for right percutaneous nephrostomy/nephroureteral catheter placement prior to nephrolithotomy.  He currently denies fever, headache, chest pain, dyspnea, cough, nausea, vomiting or bleeding.  He does some intermittent right lower abdominal discomfort, chronic back pain and right hip discomfort.  Additional medical history as below.  Past Medical History:  Diagnosis Date  . Arthritis    hands  . Benign prostatic hyperplasia   . Bilateral ureteral calculi   . Chronic back pain   . Chronic kidney disease, stage 3a (HCC)   . Complication of anesthesia    self limited tremors after 04/19/15 cholecystectomy  . DDD (degenerative disc disease), lumbosacral   . GERD (gastroesophageal reflux disease)   . GERD without esophagitis   . H/O mitral valve prolapse 2008 per echo   asymptomatic and does not see cardiologist.   . Hematuria   . History of kidney stones   . Mixed hyperlipidemia   . 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 Pneumonia   . Renal calculi    left  . Renal insufficiency    Past Surgical History:  Procedure Laterality Date  . ANTERIOR CERVICAL DECOMP/DISCECTOMY FUSION  C5--C7  06-05-2002//  C7--T1   03-29-2006  . BICEPS TENDON REPAIR Right 2004 approx  . CHOLECYSTECTOMY    . COLONOSCOPY WITH PROPOFOL N/A 06/27/2015   Procedure: COLONOSCOPY WITH PROPOFOL;  Surgeon: Midge Minium, MD;  Location: [facility];   Service: Endoscopy;  Laterality: N/A;  . CYSTO/  RIGHT RETROGRADE PYELOGRAM/ URETEROSCOPY ATTEMPTED STONE MANIPULATION/  RIGHT STENT PLACEMENT  03-03-2009  . CYSTOSCOPY WITH RETROGRADE PYELOGRAM, URETEROSCOPY AND STENT PLACEMENT Right 01/24/2014   Procedure: CYSTOSCOPY WITH RETROGRADE PYELOGRAM, URETEROSCOPY AND STENT PLACEMENT, holmium laser, stone extraction;  Surgeon: Danae Chen, MD;  Location[facility];  Service: Urology;  Laterality: Right;  . CYSTOSCOPY WITH RETROGRADE PYELOGRAM, URETEROSCOPY AND STENT PLACEMENT Right 02/07/2014   Procedure: RIGHT URETEROSCOPY, STONE EXTRACTION AND POSSIBLE STENT PLACEMENT;  Surgeon: Anner Crete, MD;  Location: [facility];  Service: Urology;  Laterality: Right;  . CYSTOSCOPY WITH RETROGRADE PYELOGRAM, URETEROSCOPY AND STENT PLACEMENT Bilateral 02/04/2015   Procedure: CYSTOSCOPY WITH RETROGRADE PYELOGRAM, URETEROSCOPY AND STENT PLACEMENT, BILATERAL;  Surgeon: Jethro Bolus, MD;  Location: [facility];  Service: Urology;  Laterality: Bilateral;  . CYSTOSCOPY WITH URETEROSCOPY, STONE BASKETRY AND STENT PLACEMENT Bilateral 02/20/2015   Procedure: CYSTOSCOPY WITH BILATERAL URETEROSCOPY, STONE EXTRACTION WITH LASER  AND STENT PLACEMENT;  Surgeon: Bjorn Pippin, MD;  Location: [facility];  Service: Urology;  Laterality: Bilateral;  . ESOPHAGOGASTRODUODENOSCOPY (EGD) WITH PROPOFOL N/A 10/17/2015   Procedure: ESOPHAGOGASTRODUODENOSCOPY (EGD) WITH PROPOFOL;  Surgeon: Midge Minium, MD;  Location: [facility];  Service: Endoscopy;  Laterality: N/A;  . EXTRACORPOREAL SHOCK WAVE LITHOTRIPSY  Left 02-13-2015//  Right 01-17-2014  . HOLMIUM LASER APPLICATION Right 02/07/2014   Procedure: HOLMIUM LASER APPLICATION;  Surgeon: Anner Crete, MD;  Location: [facility];  Service: Urology;  Laterality: Right;  . HOLMIUM LASER APPLICATION N/A 02/20/2015   Procedure: HOLMIUM LASER APPLICATION;  Surgeon: Bjorn Pippin, MD;   Location: [facility];  Service: Urology;  Laterality: N/A;  . IR URETERAL STENT LEFT NEW ACCESS W/O SEP NEPHROSTOMY CATH  05/08/2020  . LAPAROSCOPIC CHOLECYSTECTOMY SINGLE PORT N/A 04/19/2015   Procedure: LAPAROSCOPIC CHOLECYSTECTOMY;  Surgeon: Lattie Haw, MD;  Location: [facility];  Service: General;  Laterality: N/A;  . LEFT URETEROSCOPIC LASER LITHOTRIPSY STONE EXTRACTION W/ STENT PLACEMENT  03-09-2011;   09-18-2009  . LEFT URETEROSCOPIC STONE EXTRACTION W/ STENT  09-25-2009  . LUMBAR LAMINECTOMY  x2  ----  L3 - L5//  L5 -- S1  . NEPHROLITHOTOMY Left 05/08/2020   Procedure: LEFT NEPHROLITHOTOMY PERCUTANEOUS;  Surgeon: Bjorn Pippin, MD;  Location: [facility];  Service: Urology;  Laterality: Left;  . POSTERIOR FUSION LUMBAR SPINE  09-08-2010   Re-do Laminectomy L3-S1 w/  Decompression and fusion   . RIGHT URETEROSCOPIC STONE EXTRACTION  03-13-2009  . TRANSTHORACIC ECHOCARDIOGRAM  07-04-2007   Mild focal basal septal hypertrophy/  ef 60%/  mild MVP involving anterior and posterior leaflets w/ trivial MR/  trivial pericardial effusion posterior to the heart      Allergies: Cheese, Codeine, Lactose intolerance (gi), Morphine and related, and Morphine and related  Medications: Prior to Admission medications   Medication Sig Start Date End Date Taking? Authorizing Provider  acetaminophen (TYLENOL) 500 MG tablet Take 2 tablets (1,000 mg total) by mouth every 8 (eight) hours as needed. Patient taking differently: Take 1,000 mg by mouth every 8 (eight) hours as needed for mild pain, moderate pain or headache.  01/17/20   Maczis, Elmer Sow, PA-C  bacitracin ointment Apply topically 2 (two) times daily. Patient not taking: Reported on 04/23/2020 01/17/20   Maczis, Elmer Sow, PA-C  esomeprazole (NEXIUM) 20 MG capsule Take 20 mg by mouth daily at 12 noon.    [provider]  lidocaine (LIDODERM) 5 % Place 1 patch onto the skin daily as needed (pain). Remove & Discard patch within  12 hours or as directed by MD     [provider]  methocarbamol (ROBAXIN) 500 MG tablet Take 2 tablets (1,000 mg total) by mouth every 8 (eight) hours as needed for muscle spasms. Patient taking differently: Take 1,000 mg by mouth every 6 (six) hours as needed for muscle spasms.  01/17/20   Maczis, Elmer Sow, PA-C  naproxen sodium (ALEVE) 220 MG tablet Take 440 mg by mouth daily as needed (pain).    [provider]  ondansetron (ZOFRAN-ODT) 4 MG disintegrating tablet Take 1 tablet (4 mg total) by mouth every 6 (six) hours as needed for nausea. Patient taking differently: Take 4 mg by mouth every 8 (eight) hours as needed for nausea.  01/17/20   Maczis, Elmer Sow, PA-C  potassium citrate (UROCIT-K) 10 MEQ (1080 MG) SR tablet Take 10 mEq by mouth 3 (three) times daily. 09/16/18   [provider]  tamsulosin (FLOMAX) 0.4 MG CAPS capsule Take 0.8 mg by mouth daily. 12/18/19   [provider]     Vital Signs: BP (!) 160/97   Pulse 84   Temp 97.8 F (36.6 C) (Oral)   Resp 18   SpO2 98%   Physical Exam awake, alert.  Chest clear to auscultation bilaterally.  Heart with regular rate and rhythm.  Abdomen soft, positive bowel sounds, currently nontender.  No lower extremity edema.  Imaging: No results found.  Labs:  CBC: Recent  Labs    01/30/20 0456 01/30/20 0456 05/02/20 1038 05/02/20 1038 05/08/20 0823 05/08/20 1320 05/09/20 0522 06/18/20 1027  WBC 9.6  --  5.4  --  5.3  --   --  6.8  HGB 11.6*   < > 15.8   < > 15.3 13.4 15.0 14.7  HCT 36.0*   < > 48.7   < > 47.2 41.1 46.0 46.5  PLT 376  --  166  --  182  --   --  181   < > = values in this interval not displayed.    COAGS: Recent Labs    01/14/20 1552 01/24/20 1522 01/24/20 1705 05/08/20 0823  INR 1.0 NOT CALCULATED 1.2 1.0  APTT  --   --  26  --     BMP: Recent Labs    01/29/20 0420 01/29/20 0420 01/30/20 0456 05/02/20 1038 05/08/20 0823 06/18/20 1027  NA 137   < > 136 136 137  140  K 3.9   < > 3.7 4.5 3.9 4.7  CL 102   < > 103 102 105 103  CO2 23   < > 24 27 24 28   GLUCOSE 109*   < > 104* 100* 102* 70  BUN 21   < > 18 19 19 23   CALCIUM 8.6*   < > 9.0 9.3 9.2 9.6  CREATININE 1.38*   < > 1.31* 1.56* 1.59* 1.77*  GFRNONAA 53*   < > 56* 45* 44* 41*  GFRAA >60  --  >60 52* 51*  --    < > = values in this interval not displayed.    LIVER FUNCTION TESTS: Recent Labs    01/14/20 1552 01/24/20 1522 01/25/20 0442  BILITOT 0.8 0.8 1.2  AST 30 36 42*  ALT 20 38 34  ALKPHOS 138* 143* 111  PROT 6.7 6.4* 5.4*  ALBUMIN 3.2* 3.1* 2.5*    Assessment and Plan: Patient with past medical history of BPH, bilateral nephrolithiasis, chronic kidney disease, degenerative disc disease, GERD, hyperlipidemia; status post left PCNL on 05/08/2020; scheduled today for right percutaneous nephrostomy/nephroureteral catheter placement prior to nephrolithotomy on 11/2; risks and benefits of right PCN placement was discussed with the patient including, but not limited to, infection, bleeding, significant bleeding causing loss or decrease in renal function or damage to adjacent structures.   All of the patient's questions were answered, patient is agreeable to proceed.  Consent signed and in chart.      Electronically Signed: D. 05/10/2020, PA-C 06/23/2020, 11:56 AM   I spent a total of 20 minutes at the the patient's bedside AND on the patient's hospital floor or unit, greater than 50% of which was counseling/coordinating care for right percutaneous nephrostomy/nephroureteral catheter placement

## 2020-06-23 NOTE — Anesthesia Preprocedure Evaluation (Addendum)
Anesthesia Evaluation  Patient identified by MRN, date of birth, ID band Patient awake    Reviewed: Allergy & Precautions, NPO status , Patient's Chart, lab work & pertinent test results  Airway Mallampati: I  TM Distance: >3 FB Neck ROM: Full    Dental  (+) Chipped, Missing, Poor Dentition,    Pulmonary former smoker,    Pulmonary exam normal breath sounds clear to auscultation       Cardiovascular Normal cardiovascular exam+ Valvular Problems/Murmurs MVP  Rhythm:Regular Rate:Normal  HLD  TTE 12/2019 1. Left ventricular ejection fraction, by estimation, is 60 to 65%. The left ventricle has normal function. The left ventricle has no regional wall motion abnormalities. Left ventricular diastolic parameters are consistent with Grade I diastolic dysfunction (impaired relaxation).  2. Right ventricular systolic function is normal. The right ventricular size is normal. Tricuspid regurgitation signal is inadequate for assessing PA pressure.  3. The mitral valve is normal in structure. No evidence of mitral valve regurgitation. No evidence of mitral stenosis.  4. The aortic valve is tricuspid. Aortic valve regurgitation is not visualized. No aortic stenosis is present.  5. The inferior vena cava is normal in size with greater than 50% respiratory variability, suggesting right atrial pressure of 3 mmHg.   Neuro/Psych    GI/Hepatic hiatal hernia, GERD  Medicated,  Endo/Other    Renal/GU Renal InsufficiencyRenal disease (K 4.7, Cr 1.77)     Musculoskeletal  (+) Arthritis , S/p ACDF   Abdominal   Peds  Hematology   Anesthesia Other Findings Right renal stone  Reproductive/Obstetrics                            Anesthesia Physical Anesthesia Plan  ASA: III  Anesthesia Plan: General   Post-op Pain Management:    Induction: Intravenous  PONV Risk Score and Plan: 2 and Midazolam, Dexamethasone and  Ondansetron  Airway Management Planned: Oral ETT  Additional Equipment:   Intra-op Plan:   Post-operative Plan: Extubation in OR  Informed Consent: I have reviewed the patients History and Physical, chart, labs and discussed the procedure including the risks, benefits and alternatives for the proposed anesthesia with the patient or authorized representative who has indicated his/her understanding and acceptance.     Dental advisory given  Plan Discussed with: CRNA  Anesthesia Plan Comments:         Anesthesia Quick Evaluation

## 2020-06-24 ENCOUNTER — Observation Stay (HOSPITAL_COMMUNITY): Payer: BC Managed Care – PPO | Admitting: Anesthesiology

## 2020-06-24 ENCOUNTER — Observation Stay (HOSPITAL_COMMUNITY)
Admission: RE | Admit: 2020-06-24 | Discharge: 2020-06-25 | Disposition: A | Discharge status: Home / Self Care | Payer: BC Managed Care – PPO | Attending: Urology | Admitting: Urology

## 2020-06-24 ENCOUNTER — Observation Stay (HOSPITAL_COMMUNITY): Payer: BC Managed Care – PPO

## 2020-06-24 ENCOUNTER — Encounter (HOSPITAL_COMMUNITY)
Admission: RE | Disposition: A | Discharge status: Home / Self Care | Payer: Self-pay | Source: Home / Self Care | Attending: Urology

## 2020-06-24 ENCOUNTER — Encounter (HOSPITAL_COMMUNITY): Payer: Self-pay | Admitting: Urology

## 2020-06-24 DIAGNOSIS — N1831 Chronic kidney disease, stage 3a: Secondary | ICD-10-CM | POA: Diagnosis not present

## 2020-06-24 DIAGNOSIS — E782 Mixed hyperlipidemia: Secondary | ICD-10-CM | POA: Diagnosis not present

## 2020-06-24 DIAGNOSIS — N132 Hydronephrosis with renal and ureteral calculous obstruction: Secondary | ICD-10-CM | POA: Diagnosis not present

## 2020-06-24 DIAGNOSIS — K219 Gastro-esophageal reflux disease without esophagitis: Secondary | ICD-10-CM | POA: Diagnosis not present

## 2020-06-24 DIAGNOSIS — N200 Calculus of kidney: Principal | ICD-10-CM | POA: Insufficient documentation

## 2020-06-24 DIAGNOSIS — A419 Sepsis, unspecified organism: Secondary | ICD-10-CM | POA: Diagnosis not present

## 2020-06-24 HISTORY — PX: NEPHROLITHOTOMY: SHX5134

## 2020-06-24 LAB — HEMOGLOBIN AND HEMATOCRIT, BLOOD
HCT: 42.5 % (ref 39.0–52.0)
Hemoglobin: 13.7 g/dL (ref 13.0–17.0)

## 2020-06-24 SURGERY — NEPHROLITHOTOMY PERCUTANEOUS
Anesthesia: General | Laterality: Right

## 2020-06-24 MED ORDER — SODIUM CHLORIDE 0.9 % IR SOLN
Status: DC | PRN
  Administered 2020-06-24: 6000 mL

## 2020-06-24 MED ORDER — PHENYLEPHRINE 40 MCG/ML (10ML) SYRINGE FOR IV PUSH (FOR BLOOD PRESSURE SUPPORT)
PREFILLED_SYRINGE | INTRAVENOUS | Status: AC
  Filled 2020-06-24: qty 10

## 2020-06-24 MED ORDER — OXYBUTYNIN CHLORIDE 5 MG PO TABS: 5.0000 mg | ORAL_TABLET | Freq: Three times a day (TID) | ORAL | Status: DC | PRN

## 2020-06-24 MED ORDER — PHENYLEPHRINE HCL (PRESSORS) 10 MG/ML IV SOLN
INTRAVENOUS | Status: AC
  Filled 2020-06-24: qty 1

## 2020-06-24 MED ORDER — ROCURONIUM BROMIDE 100 MG/10ML IV SOLN
INTRAVENOUS | Status: DC | PRN
  Administered 2020-06-24: 80 mg via INTRAVENOUS

## 2020-06-24 MED ORDER — SENNOSIDES-DOCUSATE SODIUM 8.6-50 MG PO TABS: 1.0000 | ORAL_TABLET | Freq: Every evening | ORAL | Status: DC | PRN

## 2020-06-24 MED ORDER — CEFAZOLIN SODIUM-DEXTROSE 2-4 GM/100ML-% IV SOLN
2.0000 g | INTRAVENOUS | Status: DC
  Filled 2020-06-24: qty 100

## 2020-06-24 MED ORDER — MIDAZOLAM HCL 5 MG/5ML IJ SOLN
INTRAMUSCULAR | Status: DC | PRN
  Administered 2020-06-24: 2 mg via INTRAVENOUS

## 2020-06-24 MED ORDER — ORAL CARE MOUTH RINSE: 15.0000 mL | Freq: Once | OROMUCOSAL | Status: AC

## 2020-06-24 MED ORDER — LIDOCAINE 2% (20 MG/ML) 5 ML SYRINGE
INTRAMUSCULAR | Status: AC
  Filled 2020-06-24: qty 5

## 2020-06-24 MED ORDER — PROPOFOL 10 MG/ML IV BOLUS
INTRAVENOUS | Status: DC | PRN
  Administered 2020-06-24: 150 mg via INTRAVENOUS

## 2020-06-24 MED ORDER — DEXAMETHASONE SODIUM PHOSPHATE 10 MG/ML IJ SOLN
INTRAMUSCULAR | Status: DC | PRN
  Administered 2020-06-24: 5 mg via INTRAVENOUS

## 2020-06-24 MED ORDER — ONDANSETRON HCL 4 MG/2ML IJ SOLN
4.0000 mg | INTRAMUSCULAR | Status: DC | PRN
  Administered 2020-06-24: 4 mg via INTRAVENOUS

## 2020-06-24 MED ORDER — OXYCODONE HCL 5 MG PO TABS
5.0000 mg | ORAL_TABLET | ORAL | Status: DC | PRN
  Administered 2020-06-24 – 2020-06-25 (×4): 5 mg via ORAL
  Filled 2020-06-24 (×4): qty 1

## 2020-06-24 MED ORDER — IOHEXOL 300 MG/ML  SOLN
INTRAMUSCULAR | Status: DC | PRN
  Administered 2020-06-24: 22 mL

## 2020-06-24 MED ORDER — DIPHENHYDRAMINE HCL 50 MG/ML IJ SOLN: 12.5000 mg | Freq: Four times a day (QID) | INTRAMUSCULAR | Status: DC | PRN

## 2020-06-24 MED ORDER — PHENYLEPHRINE 40 MCG/ML (10ML) SYRINGE FOR IV PUSH (FOR BLOOD PRESSURE SUPPORT)
PREFILLED_SYRINGE | INTRAVENOUS | Status: DC | PRN
  Administered 2020-06-24: 160 ug via INTRAVENOUS
  Administered 2020-06-24: 200 ug via INTRAVENOUS
  Administered 2020-06-24: 120 ug via INTRAVENOUS
  Administered 2020-06-24: 80 ug via INTRAVENOUS

## 2020-06-24 MED ORDER — POTASSIUM CHLORIDE IN NACL 20-0.45 MEQ/L-% IV SOLN
INTRAVENOUS | Status: DC
  Filled 2020-06-24 (×5): qty 1000

## 2020-06-24 MED ORDER — FENTANYL CITRATE (PF) 100 MCG/2ML IJ SOLN
INTRAMUSCULAR | Status: AC
  Administered 2020-06-24: 50 ug via INTRAVENOUS
  Filled 2020-06-24: qty 2

## 2020-06-24 MED ORDER — FENTANYL CITRATE (PF) 100 MCG/2ML IJ SOLN: 25.0000 ug | INTRAMUSCULAR | Status: DC | PRN

## 2020-06-24 MED ORDER — DIPHENHYDRAMINE HCL 12.5 MG/5ML PO ELIX: 12.5000 mg | ORAL_SOLUTION | Freq: Four times a day (QID) | ORAL | Status: DC | PRN

## 2020-06-24 MED ORDER — HYDROMORPHONE HCL 1 MG/ML IJ SOLN: 0.5000 mg | INTRAMUSCULAR | Status: DC | PRN

## 2020-06-24 MED ORDER — CEFAZOLIN SODIUM-DEXTROSE 1-4 GM/50ML-% IV SOLN
1.0000 g | Freq: Three times a day (TID) | INTRAVENOUS | Status: DC
  Administered 2020-06-24 – 2020-06-25 (×2): 1 g via INTRAVENOUS
  Filled 2020-06-24 (×3): qty 50

## 2020-06-24 MED ORDER — CHLORHEXIDINE GLUCONATE 0.12 % MT SOLN
15.0000 mL | Freq: Once | OROMUCOSAL | Status: AC
  Administered 2020-06-24: 15 mL via OROMUCOSAL

## 2020-06-24 MED ORDER — FLEET ENEMA 7-19 GM/118ML RE ENEM: 1.0000 | ENEMA | Freq: Once | RECTAL | Status: DC | PRN

## 2020-06-24 MED ORDER — SUGAMMADEX SODIUM 500 MG/5ML IV SOLN
INTRAVENOUS | Status: DC | PRN
  Administered 2020-06-24: 360 mg via INTRAVENOUS

## 2020-06-24 MED ORDER — FENTANYL CITRATE (PF) 100 MCG/2ML IJ SOLN
INTRAMUSCULAR | Status: AC
  Filled 2020-06-24: qty 2

## 2020-06-24 MED ORDER — PHENYLEPHRINE HCL-NACL 10-0.9 MG/250ML-% IV SOLN
INTRAVENOUS | Status: DC | PRN
  Administered 2020-06-24: 50 ug/min via INTRAVENOUS

## 2020-06-24 MED ORDER — MIDAZOLAM HCL 2 MG/2ML IJ SOLN
INTRAMUSCULAR | Status: AC
  Filled 2020-06-24: qty 2

## 2020-06-24 MED ORDER — FENTANYL CITRATE (PF) 100 MCG/2ML IJ SOLN
INTRAMUSCULAR | Status: DC | PRN
  Administered 2020-06-24 (×2): 50 ug via INTRAVENOUS

## 2020-06-24 MED ORDER — ZOLPIDEM TARTRATE 5 MG PO TABS: 5.0000 mg | ORAL_TABLET | Freq: Every evening | ORAL | Status: DC | PRN

## 2020-06-24 MED ORDER — BISACODYL 10 MG RE SUPP: 10.0000 mg | Freq: Every day | RECTAL | Status: DC | PRN

## 2020-06-24 MED ORDER — LACTATED RINGERS IV SOLN: INTRAVENOUS | Status: DC

## 2020-06-24 MED ORDER — SUGAMMADEX SODIUM 500 MG/5ML IV SOLN
INTRAVENOUS | Status: AC
  Filled 2020-06-24: qty 5

## 2020-06-24 MED ORDER — OXYCODONE-ACETAMINOPHEN 5-325 MG PO TABS
1.0000 | ORAL_TABLET | ORAL | 0 refills | Status: DC | PRN
Start: 2020-06-24 — End: 2020-09-16

## 2020-06-24 MED ORDER — ACETAMINOPHEN 325 MG PO TABS: 650.0000 mg | ORAL_TABLET | ORAL | Status: DC | PRN

## 2020-06-24 MED ORDER — CEFAZOLIN SODIUM-DEXTROSE 2-3 GM-%(50ML) IV SOLR
INTRAVENOUS | Status: DC | PRN
  Administered 2020-06-24: 2 g via INTRAVENOUS

## 2020-06-24 MED ORDER — ACETAMINOPHEN 500 MG PO TABS
1000.0000 mg | ORAL_TABLET | Freq: Once | ORAL | Status: AC
  Administered 2020-06-24: 1000 mg via ORAL
  Filled 2020-06-24: qty 2

## 2020-06-24 MED ORDER — LIDOCAINE HCL (CARDIAC) PF 100 MG/5ML IV SOSY
PREFILLED_SYRINGE | INTRAVENOUS | Status: DC | PRN
  Administered 2020-06-24: 60 mg via INTRAVENOUS

## 2020-06-24 SURGICAL SUPPLY — 49 items
BAG URINE DRAIN 2000ML AR STRL (UROLOGICAL SUPPLIES) ×2 IMPLANT
BASKET ZERO TIP NITINOL 2.4FR (BASKET) IMPLANT
BENZOIN TINCTURE PRP APPL 2/3 (GAUZE/BANDAGES/DRESSINGS) ×2 IMPLANT
BLADE SURG 15 STRL LF DISP TIS (BLADE) ×1 IMPLANT
BLADE SURG 15 STRL SS (BLADE) ×2
CATH AINSWORTH 30CC 24FR (CATHETERS) IMPLANT
CATH COUNCIL 22FR (CATHETERS) ×2 IMPLANT
CATH ROBINSON RED A/P 20FR (CATHETERS) IMPLANT
CATH URET 5FR 28IN OPEN ENDED (CATHETERS) ×2 IMPLANT
CATH URET DUAL LUMEN 6-10FR 50 (CATHETERS) ×2 IMPLANT
CATH UROLOGY TORQUE 40 (MISCELLANEOUS) ×2 IMPLANT
CATH X-FORCE N30 NEPHROSTOMY (TUBING) ×2 IMPLANT
CHLORAPREP W/TINT 26 (MISCELLANEOUS) ×2 IMPLANT
COVER SURGICAL LIGHT HANDLE (MISCELLANEOUS) ×2 IMPLANT
COVER WAND RF STERILE (DRAPES) IMPLANT
DRAPE C-ARM 42X120 X-RAY (DRAPES) ×2 IMPLANT
DRAPE LINGEMAN PERC (DRAPES) ×2 IMPLANT
DRAPE SURG IRRIG POUCH 19X23 (DRAPES) ×2 IMPLANT
DRSG PAD ABDOMINAL 8X10 ST (GAUZE/BANDAGES/DRESSINGS) ×2 IMPLANT
DRSG TEGADERM 8X12 (GAUZE/BANDAGES/DRESSINGS) ×2 IMPLANT
EXTRACTOR STONE NITINOL NGAGE (UROLOGICAL SUPPLIES) IMPLANT
GAUZE SPONGE 4X4 12PLY STRL (GAUZE/BANDAGES/DRESSINGS) ×2 IMPLANT
GLOVE SURG SS PI 8.0 STRL IVOR (GLOVE) ×2 IMPLANT
GOWN STRL REUS W/TWL XL LVL3 (GOWN DISPOSABLE) ×2 IMPLANT
GUIDEWIRE AMPLAZ .035X145 (WIRE) ×4 IMPLANT
GUIDEWIRE STR DUAL SENSOR (WIRE) IMPLANT
KIT BASIN OR (CUSTOM PROCEDURE TRAY) ×2 IMPLANT
KIT PROBE 340X3.4XDISP GRN (MISCELLANEOUS) IMPLANT
KIT PROBE TRILOGY 3.4X340 (MISCELLANEOUS)
KIT PROBE TRILOGY 3.9X350 (MISCELLANEOUS) ×2 IMPLANT
KIT TURNOVER KIT A (KITS) IMPLANT
LASER FIB FLEXIVA PULSE ID 365 (Laser) IMPLANT
MANIFOLD NEPTUNE II (INSTRUMENTS) ×2 IMPLANT
NS IRRIG 1000ML POUR BTL (IV SOLUTION) ×2 IMPLANT
PACK CYSTO (CUSTOM PROCEDURE TRAY) ×2 IMPLANT
SHEATH PEELAWAY SET 9 (SHEATH) IMPLANT
SPONGE LAP 4X18 RFD (DISPOSABLE) ×2 IMPLANT
SUT SILK 2 0 30  PSL (SUTURE) ×2
SUT SILK 2 0 30 PSL (SUTURE) ×1 IMPLANT
SYR 10ML LL (SYRINGE) ×2 IMPLANT
SYR 20ML LL LF (SYRINGE) ×4 IMPLANT
TOWEL OR 17X26 10 PK STRL BLUE (TOWEL DISPOSABLE) ×2 IMPLANT
TOWEL OR NON WOVEN STRL DISP B (DISPOSABLE) ×2 IMPLANT
TRACTIP FLEXIVA PULS ID 200XHI (Laser) IMPLANT
TRACTIP FLEXIVA PULSE ID 200 (Laser)
TRAY FOLEY MTR SLVR 16FR STAT (SET/KITS/TRAYS/PACK) ×2 IMPLANT
TUBING CONNECTING 10 (TUBING) ×4 IMPLANT
TUBING STONE CATCHER TRILOGY (MISCELLANEOUS) ×2 IMPLANT
TUBING UROLOGY SET (TUBING) ×2 IMPLANT

## 2020-06-24 NOTE — Op Note (Signed)
Procedure: 1.  Right percutaneous nephrolithotomy for 2.1 cm partial staghorn. 2.  Right antegrade nephrostogram with interpretation through existing tract. 3.  Application of fluoroscopy.  Preop diagnosis: Right lower pole partial staghorn stone.  Postop diagnosis: Same.  Surgeon: Dr. Irine Seal.  Anesthesia: General.  Specimen: Stone fragments.  Drain: 1.  67 [place] council catheter nephrostomy tube with coaxial 5 French ureteral catheter. 2.  Foley catheter.  EBL: 20 mL.  Complications: None.  Indications: The patient is a 69 year old male with a history of recurrent urolithiasis and was found on recent imaging to have bilateral partial staghorn stones.  He had previously undergone percutaneous nephrolithotomy for his left-sided stones and returns now for the right-sided stone.  There is approximately 2.1 cm right lower pole stone.  Procedure: He had right lower pole nephrostomy access placed on 06/23/2020 and interventional radiology.  He was taken operating room where he was given 2 g of Ancef.  A general anesthetic was induced on the holding room stretcher and a Foley catheter was placed using sterile technique.  He was then rolled prone on chest rolls with care taken to pad all pressure points.  His right nephrostomy access site was prepped with ChloraPrep and he was draped in usual sterile fashion.  The previously placed access catheter was then cannulated with a Super Stiff wire which was passed to the bladder under fluoroscopic guidance.  The catheter was then removed and the incision was lengthened to 2 cm with a knife.  The dual-lumen 8 French catheter was passed over the wire into the proximal ureter and a second wire was then advanced into the bladder under fluoroscopic guidance.  The dual-lumen catheter was removed and was replaced with the NephroMax balloon which was inflated to 20 atm and then the sheath was advanced into the renal pelvis.  The balloon was deflated and  removed and the wires were left in place.  The rigid nephroscope was then advanced through the sheath into the renal pelvis where a few old clots were identified and removed with the 2 jaw grasper.  The stone was then readily seen but it was too large to remove intact.  The trilogy lithotrite was then used to break the stone into manageable fragments using both ultrasonic and impact functions.  The fragments were then removed with the 2 prong grasper.  Once all visible fragments were removed fluoroscopy was used to assess the kidney and no additional fragments were identified.  I then passed the flexible nephroscope and inspected the upper mid and lower calyces and was unable to find any additional stone fragments.  A 22 French [place] catheter was cannulated with a 5 [place] ureteral catheter and advanced over the working wire into the collecting system.  The safety and working wire were then removed.  The 5 French catheter was advanced into the distal ureter but not the bladder and contrast was instilled.  Antegrade ureterogram demonstrated free flow into the bladder.    Contrast was then instilled into the 22 French catheter.  The antegrade nephrostogram demonstrated the catheter tip to be in good position in the renal pelvis.  There was some extravasation along the sheath but no other extravasation was noted.  The catheter balloon was filled with 2 mL of sterile water.  The access sheath was then backed out and the nephrostomy tube was secured with a 2-0 silk suture to the skin and placed to straight drainage.  The drapes were removed and a dressing was applied.  He was then rolled supine on the recovery room stretcher.  His anesthetic was reversed and he was moved to recovery in stable condition.  There were no complications.  The stone fragments were given to his family.

## 2020-06-24 NOTE — Interval H&P Note (Signed)
History and Physical Interval Note: Tube placed yesterday.   06/24/2020 9:06 AM  Anthony [name]  has presented today for surgery, with the diagnosis of RIGHT RENAL STONE.  The various methods of treatment have been discussed with the patient and family. After consideration of risks, benefits and other options for treatment, the patient has consented to  Procedure(s): RIGHT NEPHROLITHOTOMY PERCUTANEOUS (Right) as a surgical intervention.  The patient's history has been reviewed, patient examined, no change in status, stable for surgery.  I have reviewed the patient's chart and labs.  Questions were answered to the patient's satisfaction.     Bjorn Pippin

## 2020-06-24 NOTE — Discharge Instructions (Signed)
Percutaneous Nephrolithotomy, Care After °This sheet gives you information about how to care for yourself after your procedure. Your health care provider may also give you more specific instructions. If you have problems or questions, contact your health care provider. °What can I expect after the procedure? °After the procedure, it is common to have: °· Soreness or pain. °· A small amount of blood or clear fluid coming from your incision for a few days. °· Fatigue. °· Some blood in your urine. This will last for a few days. °Follow these instructions at home: °Medicines °· Take over-the-counter and prescription medicines only as told by your health care provider. °· If you were prescribed an antibiotic medicine, take it as told by your health care provider. Do not stop using the antibiotic even if you start to feel better. °· Ask your health care provider if the medicine prescribed to you: °? Requires you to avoid driving or using heavy machinery. °? Can cause constipation. You may need to take these actions to prevent or treat constipation: °§ Drink enough fluid to keep your urine pale yellow. °§ Take over-the-counter or prescription medicines. °§ Eat foods that are high in fiber, such as beans, whole grains, and fresh fruits and vegetables. °§ Limit foods that are high in fat and processed sugars, such as fried or sweet foods. °Incision care ° °· Follow instructions from your health care provider about how to take care of your incision. Make sure you: °? Wash your hands with soap and water before and after you change your bandage (dressing). If soap and water are not available, use hand sanitizer. °? Change your dressing as told by your health care provider. °? Leave stitches (sutures), skin glue, or adhesive strips in place. These skin closures may need to stay in place for 2 weeks or longer. If adhesive strip edges start to loosen and curl up, you may trim the loose edges. Do not remove adhesive strips  completely unless your health care provider tells you to do that. °· Check your incision area every day for signs of infection. Check for: °? Redness, swelling, or pain. °? More fluid or blood. °? Warmth. °? Pus or a bad smell. °· Do not take baths, swim, or use a hot tub until your health care provider approves. Ask your health care provider if you may take showers. You may only be allowed to take sponge baths. °Activity °· Avoid strenuous activities for as long as told by your health care provider. °· Return to your normal activities as told by your health care provider. Ask your health care provider what activities are safe for you. °General instructions °· If you were sent home with a catheter or surgical drain (nephrostomy tube), follow your health care provider's instructions on how to take care of it. °· Wear compression stockings as told by your health care provider. These stockings help to prevent blood clots and reduce swelling in your legs. °· Do not use any products that contain nicotine or tobacco, such as cigarettes, e-cigarettes, and chewing tobacco. These can delay incision healing after surgery. If you need help quitting, ask your health care provider. °· Keep all follow-up visits as told by your health care provider. This is important. °? If you still have a stent, your health care provider will need to remove it after 1-2 weeks. °Contact a health care provider if: °· You have a fever. °· You have redness, swelling, or pain around your incision. °· You have more   fluid or blood coming from your incision. °· Your incision feels warm to the touch. °· You have pus or a bad smell coming from your incision. °· You lose your appetite. °· You feel nauseous or you vomit. °· You have been sent home with a urinary catheter or a surgical drain and urine flow suddenly stops, followed by kidney pain. °Get help right away if: °· You notice a large amount of blood in your urine. °· You have blood clots in your  urine. °· You cannot urinate. °· You have chest pain or trouble breathing. °Summary °· After the procedure, it is common to feel soreness and discomfort. You may also see blood in your urine. °· You will be told how to care for yourself after the procedure. Follow instructions on how to care for your incision and how to recognize signs of infection. °· Avoid activities that require great effort. Return to activities as told by your health care provider. °· Get help right away if you have blood clots in your urine, you cannot urinate, or you have trouble breathing. °This information is not intended to replace advice given to you by your health care provider. Make sure you discuss any questions you have with your health care provider. °Document Revised: 03/01/2018 Document Reviewed: 03/01/2018 °Elsevier Patient Education © 2020 [employer] ° °

## 2020-06-24 NOTE — Transfer of Care (Signed)
Immediate Anesthesia Transfer of Care Note  Patient: Anthony [name]  Procedure(s) Performed: RIGHT NEPHROLITHOTOMY PERCUTANEOUS (Right )  Patient Location: PACU  Anesthesia Type:General  Level of Consciousness: awake, alert  and oriented  Airway & Oxygen Therapy: Patient Spontanous Breathing and Patient connected to face mask  Post-op Assessment: Report given to RN and Post -op Vital signs reviewed and stable  Post vital signs: Reviewed and stable  Last Vitals:  Vitals Value Taken Time  BP 150/87 06/24/20 1045  Temp    Pulse 58 06/24/20 1046  Resp    SpO2 100 % 06/24/20 1046  Vitals shown include unvalidated device data.  Last Pain:  Vitals:   06/24/20 0822  TempSrc:   PainSc: 5          Complications: No complications documented.

## 2020-06-24 NOTE — Anesthesia Procedure Notes (Signed)
Procedure Name: Intubation Performed by: Sudie Grumbling, CRNA Pre-anesthesia Checklist: Patient identified, Emergency Drugs available, Suction available and Patient being monitored Patient Re-evaluated:Patient Re-evaluated prior to induction Oxygen Delivery Method: Circle system utilized Preoxygenation: Pre-oxygenation with 100% oxygen Induction Type: IV induction Ventilation: Mask ventilation without difficulty and Oral airway inserted - appropriate to patient size Laryngoscope Size: Hyacinth Meeker and 2 Grade View: Grade II Tube type: Oral Tube size: 7.0 mm Number of attempts: 1 Airway Equipment and Method: Stylet and Oral airway Placement Confirmation: ETT inserted through vocal cords under direct vision,  positive ETCO2 and breath sounds checked- equal and bilateral Secured at: 22 cm Tube secured with: Tape Dental Injury: Teeth and Oropharynx as per pre-operative assessment

## 2020-06-24 NOTE — Anesthesia Postprocedure Evaluation (Signed)
Anesthesia Post Note  Patient: Anthony [name]  Procedure(s) Performed: RIGHT NEPHROLITHOTOMY PERCUTANEOUS (Right )     Patient location during evaluation: PACU Anesthesia Type: General Level of consciousness: awake and alert Pain management: pain level controlled Vital Signs Assessment: post-procedure vital signs reviewed and stable Respiratory status: spontaneous breathing, nonlabored ventilation, respiratory function stable and patient connected to nasal cannula oxygen Cardiovascular status: blood pressure returned to baseline and stable Postop Assessment: no apparent nausea or vomiting Anesthetic complications: no   No complications documented.  Last Vitals:  Vitals:   06/24/20 1215 06/24/20 1242  BP: (!) 155/83 (!) 167/81  Pulse: (!) 57 (!) 57  Resp: (!) 0 19  Temp: 36.5 C 36.5 C  SpO2: 96% 98%    Last Pain:  Vitals:   06/24/20 1258  TempSrc:   PainSc: 6                  Jacolby Risby L Akram Kissick

## 2020-06-25 ENCOUNTER — Encounter (HOSPITAL_COMMUNITY): Payer: Self-pay | Admitting: Urology

## 2020-06-25 ENCOUNTER — Observation Stay (HOSPITAL_COMMUNITY): Payer: BC Managed Care – PPO

## 2020-06-25 DIAGNOSIS — N1831 Chronic kidney disease, stage 3a: Secondary | ICD-10-CM | POA: Diagnosis not present

## 2020-06-25 DIAGNOSIS — N200 Calculus of kidney: Secondary | ICD-10-CM | POA: Diagnosis not present

## 2020-06-25 DIAGNOSIS — K449 Diaphragmatic hernia without obstruction or gangrene: Secondary | ICD-10-CM | POA: Diagnosis not present

## 2020-06-25 DIAGNOSIS — N201 Calculus of ureter: Secondary | ICD-10-CM | POA: Diagnosis not present

## 2020-06-25 DIAGNOSIS — K661 Hemoperitoneum: Secondary | ICD-10-CM | POA: Diagnosis not present

## 2020-06-25 LAB — HEMOGLOBIN AND HEMATOCRIT, BLOOD
HCT: 40.8 % (ref 39.0–52.0)
Hemoglobin: 13.1 g/dL (ref 13.0–17.0)

## 2020-06-25 MED ORDER — METHOCARBAMOL 500 MG PO TABS
1000.0000 mg | ORAL_TABLET | Freq: Three times a day (TID) | ORAL | Status: DC | PRN
  Administered 2020-06-25: 1000 mg via ORAL
  Filled 2020-06-25: qty 2

## 2020-06-25 MED ORDER — METHOCARBAMOL 500 MG PO TABS: 500.0000 mg | ORAL_TABLET | Freq: Three times a day (TID) | ORAL | Status: DC | PRN

## 2020-06-25 MED ORDER — CEPHALEXIN 500 MG PO CAPS: 500.0000 mg | ORAL_CAPSULE | Freq: Three times a day (TID) | ORAL | 0 refills | Status: AC

## 2020-06-25 MED ORDER — CHLORHEXIDINE GLUCONATE CLOTH 2 % EX PADS: 6.0000 | MEDICATED_PAD | Freq: Every day | CUTANEOUS | Status: DC

## 2020-06-25 NOTE — Progress Notes (Signed)
Pt c/o muscle spasms in back and legs states he takes Robaxin, medication not ordered.  A. Demzik notified states she will order.

## 2020-06-25 NOTE — Discharge Summary (Signed)
Physician Discharge Summary  Patient ID: Anthony [name] MRN: [id number] DOB/AGE: 03/19/1951 69 y.o.  Admit date: 06/24/2020 Discharge date: 06/25/2020  Admission Diagnoses:  Right renal stone  Discharge Diagnoses:  Principal Problem:   Right renal stone   Past Medical History:  Diagnosis Date  . Arthritis    hands  . Benign prostatic hyperplasia   . Bilateral ureteral calculi   . Chronic back pain   . Chronic kidney disease, stage 3a (HCC)   . Complication of anesthesia    self limited tremors after 04/19/15 cholecystectomy  . DDD (degenerative disc disease), lumbosacral   . GERD (gastroesophageal reflux disease)   . GERD without esophagitis   . H/O mitral valve prolapse 2008 per echo   asymptomatic and does not see cardiologist.   . Hematuria   . History of kidney stones   . Mixed hyperlipidemia   . 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 Pneumonia   . Renal calculi    left  . Renal insufficiency     Surgeries: Procedure(s): RIGHT NEPHROLITHOTOMY PERCUTANEOUS on 06/24/2020   Consultants (if any):   Discharged Condition: Improved  Hospital Course: MILLEDGE GERDING is an 69 y.o. male who was admitted 06/24/2020 with a diagnosis of Right renal stone and went to the operating room on 06/24/2020 and underwent the above named procedures.  A CT on POD#1 showed no significant residual stones on the right but a question of a small left UPJ fragment with mild obstruction from his prior PCNL and a possible left distal fragment..  The ureteral catheter was removed and the NT was plugged.  He did well without pain and was sent home with the tube plugged.   He was given perioperative antibiotics:  Anti-infectives (From admission, onward)   Start     Dose/Rate Route Frequency Ordered Stop   06/25/20 0000  cephALEXin (KEFLEX) 500 MG capsule        500 mg Oral 3 times daily 06/25/20 0809 06/28/20 2359   06/24/20 1700  ceFAZolin (ANCEF)  IVPB 1 g/50 mL premix        1 g 100 mL/hr over 30 Minutes Intravenous Every 8 hours 06/24/20 0919     06/24/20 0817  ceFAZolin (ANCEF) IVPB 2g/100 mL premix  Status:  Discontinued        2 g 200 mL/hr over 30 Minutes Intravenous 30 min pre-op 06/24/20 0817 06/24/20 1443    .  He was given sequential compression devices  for DVT prophylaxis.  He benefited maximally from the hospital stay and there were no complications.    Recent vital signs:  Vitals:   06/25/20 0012 06/25/20 0632  BP: (!) 142/88 131/83  Pulse: 77 60  Resp: 18 16  Temp: 98.1 F (36.7 C) 98 F (36.7 C)  SpO2: 96% 96%    Recent laboratory studies:  Lab Results  Component Value Date   HGB 13.1 06/25/2020   HGB 13.7 06/24/2020   HGB 14.7 06/18/2020   Lab Results  Component Value Date   WBC 6.8 06/18/2020   PLT 181 06/18/2020   Lab Results  Component Value Date   INR 1.0 06/23/2020   Lab Results  Component Value Date   NA 140 06/18/2020   K 4.7 06/18/2020   CL 103 06/18/2020   CO2 28 06/18/2020   BUN 23 06/18/2020   CREATININE 1.77 (H) 06/18/2020   GLUCOSE 70 06/18/2020  Discharge Medications:   Allergies as of 06/25/2020      Reactions   Cheese Other (See Comments)   Stomach upset , cramping.    Codeine Itching   Lactose Intolerance (gi) Other (See Comments)   GI distress   Morphine And Related Itching   Morphine And Related Other (See Comments)   Insomnia      Medication List    STOP taking these medications   bacitracin ointment     TAKE these medications   acetaminophen 500 MG tablet Commonly known as: TYLENOL Take 2 tablets (1,000 mg total) by mouth every 8 (eight) hours as needed. What changed: reasons to take this   cephALEXin 500 MG capsule Commonly known as: KEFLEX Take 1 capsule (500 mg total) by mouth 3 (three) times daily for 3 days.   esomeprazole 20 MG capsule Commonly known as: NEXIUM Take 20 mg by mouth daily at 12 noon.   lidocaine 5 % Commonly known  as: LIDODERM Place 1 patch onto the skin daily as needed (pain). Remove & Discard patch within 12 hours or as directed by MD   methocarbamol 500 MG tablet Commonly known as: ROBAXIN Take 2 tablets (1,000 mg total) by mouth every 8 (eight) hours as needed for muscle spasms. What changed: when to take this   naproxen sodium 220 MG tablet Commonly known as: ALEVE Take 440 mg by mouth daily as needed (pain).   ondansetron 4 MG disintegrating tablet Commonly known as: ZOFRAN-ODT Take 1 tablet (4 mg total) by mouth every 6 (six) hours as needed for nausea. What changed: when to take this   oxyCODONE-acetaminophen 5-325 MG tablet Commonly known as: Percocet Take 1 tablet by mouth every 4 (four) hours as needed for severe pain.   potassium citrate 10 MEQ (1080 MG) SR tablet Commonly known as: UROCIT-K Take 10 mEq by mouth 3 (three) times daily.   tamsulosin 0.4 MG Caps capsule Commonly known as: FLOMAX Take 0.8 mg by mouth daily.       Diagnostic Studies: MR CERVICAL SPINE WO CONTRAST  Result Date: 06/03/2020 CLINICAL DATA:  Cervical radiculopathy and lower thoracic pain. EXAM: MRI CERVICAL AND THORACIC SPINE WITHOUT CONTRAST TECHNIQUE: Multiplanar and multiecho pulse sequences of the cervical spine, to include the craniocervical junction and cervicothoracic junction, and the thoracic spine, were obtained without intravenous contrast. COMPARISON:  01/15/2020 cervical spine radiographs and prior. 01/14/2020 CT cervical spine. 08/13/2010 MRI cervical spine. FINDINGS: MRI CERVICAL SPINE FINDINGS Alignment: Straightening of lordosis. Sequela of C5-T1 ACDF. Associated susceptibility artifact limits evaluation. Vertebrae: Normal bone marrow signal intensity. No focal osseous lesion. Cord: Normal signal and morphology. Posterior Fossa, vertebral arteries: Negative. Disc levels: Multilevel desiccation and disc space loss. C2-3: Disc osteophyte complex with superimposed left paracentral protrusion,  uncovertebral and bilateral facet hypertrophy. Mild spinal canal and bilateral neural foraminal narrowing. C3-4: Disc osteophyte complex with right predominant uncovertebral and facet hypertrophy. Moderate spinal canal, severe right greater than left neural foraminal narrowing. C4-5: Disc osteophyte complex with superimposed central and right subarticular/foraminal protrusions. Uncovertebral and bilateral facet degenerative spurring. Mild spinal canal and left neural foraminal narrowing. Severe right neural foraminal narrowing. C5-6: Sequela of fusion.  Patent spinal canal and neural foramen. C6-7: Sequela of fusion.  Patent spinal canal and neural foramen. C7-T1: Sequela of fusion.  Patent spinal canal and neural foramen. Paraspinal tissues: Negative. MRI THORACIC SPINE FINDINGS Alignment:  Normal. Vertebrae: Normal bone marrow signal intensity.  T6 hemangioma. Cord:  Normal signal and morphology. Paraspinal and other  soft tissues: Large hiatal hernia. Disc levels: Multilevel endplate degenerative changes and desiccation. Multilevel facet degenerative spurring. T7-8 right paracentral protrusion abutting the ventral cord with partial effacement of the ventral CSF containing spaces. Patent spinal canal and neural foramen. T9-10 disc bulge partially effacing the ventral CSF containing spaces with mild spinal canal narrowing. Patent neural foramen. T12-L1 inferiorly migrated left paracentral extrusion abutting the ventral cord. Patent spinal canal and neural foramen. Additional small posterior protrusions at the T2-3, T5-6, T8-9 level without significant spinal canal or neural foraminal narrowing. IMPRESSION: MRI CERVICAL SPINE: Multilevel spondylosis, slightly progressed since prior exam. Moderate C3-4 spinal canal narrowing. Severe bilateral C3-4 and right C4-5 neural foraminal narrowing. Sequela of C5-T1 ACDF. Patent spinal canal and neural foramen at these levels. MRI THORACIC SPINE: Inferiorly migrated left T12-L1  paracentral extrusion abutting the ventral cord. T9-10 disc bulge with mild spinal canal narrowing. Patent neural foramen. T7-8 right paracentral protrusion abutting the ventral cord. Large hiatal hernia. Electronically Signed   By: Stana Buntinghikanele  Emekauwa M.D.   On: 06/03/2020 10:57   MR THORACIC SPINE WO CONTRAST  Result Date: 06/03/2020 CLINICAL DATA:  Cervical radiculopathy and lower thoracic pain. EXAM: MRI CERVICAL AND THORACIC SPINE WITHOUT CONTRAST TECHNIQUE: Multiplanar and multiecho pulse sequences of the cervical spine, to include the craniocervical junction and cervicothoracic junction, and the thoracic spine, were obtained without intravenous contrast. COMPARISON:  01/15/2020 cervical spine radiographs and prior. 01/14/2020 CT cervical spine. 08/13/2010 MRI cervical spine. FINDINGS: MRI CERVICAL SPINE FINDINGS Alignment: Straightening of lordosis. Sequela of C5-T1 ACDF. Associated susceptibility artifact limits evaluation. Vertebrae: Normal bone marrow signal intensity. No focal osseous lesion. Cord: Normal signal and morphology. Posterior Fossa, vertebral arteries: Negative. Disc levels: Multilevel desiccation and disc space loss. C2-3: Disc osteophyte complex with superimposed left paracentral protrusion, uncovertebral and bilateral facet hypertrophy. Mild spinal canal and bilateral neural foraminal narrowing. C3-4: Disc osteophyte complex with right predominant uncovertebral and facet hypertrophy. Moderate spinal canal, severe right greater than left neural foraminal narrowing. C4-5: Disc osteophyte complex with superimposed central and right subarticular/foraminal protrusions. Uncovertebral and bilateral facet degenerative spurring. Mild spinal canal and left neural foraminal narrowing. Severe right neural foraminal narrowing. C5-6: Sequela of fusion.  Patent spinal canal and neural foramen. C6-7: Sequela of fusion.  Patent spinal canal and neural foramen. C7-T1: Sequela of fusion.  Patent spinal  canal and neural foramen. Paraspinal tissues: Negative. MRI THORACIC SPINE FINDINGS Alignment:  Normal. Vertebrae: Normal bone marrow signal intensity.  T6 hemangioma. Cord:  Normal signal and morphology. Paraspinal and other soft tissues: Large hiatal hernia. Disc levels: Multilevel endplate degenerative changes and desiccation. Multilevel facet degenerative spurring. T7-8 right paracentral protrusion abutting the ventral cord with partial effacement of the ventral CSF containing spaces. Patent spinal canal and neural foramen. T9-10 disc bulge partially effacing the ventral CSF containing spaces with mild spinal canal narrowing. Patent neural foramen. T12-L1 inferiorly migrated left paracentral extrusion abutting the ventral cord. Patent spinal canal and neural foramen. Additional small posterior protrusions at the T2-3, T5-6, T8-9 level without significant spinal canal or neural foraminal narrowing. IMPRESSION: MRI CERVICAL SPINE: Multilevel spondylosis, slightly progressed since prior exam. Moderate C3-4 spinal canal narrowing. Severe bilateral C3-4 and right C4-5 neural foraminal narrowing. Sequela of C5-T1 ACDF. Patent spinal canal and neural foramen at these levels. MRI THORACIC SPINE: Inferiorly migrated left T12-L1 paracentral extrusion abutting the ventral cord. T9-10 disc bulge with mild spinal canal narrowing. Patent neural foramen. T7-8 right paracentral protrusion abutting the ventral cord. Large hiatal hernia. Electronically Signed  By: Stana Bunting M.D.   On: 06/03/2020 10:57   DG C-Arm 1-60 Min-No Report  Result Date: 06/24/2020 Fluoroscopy was utilized by the requesting physician.  No radiographic interpretation.   CT RENAL STONE STUDY  Result Date: 06/25/2020 CLINICAL DATA:  69 year old male with history of percutaneous nephrolithotomy. Evaluate for residual stone fragments. EXAM: CT ABDOMEN AND PELVIS WITHOUT CONTRAST TECHNIQUE: Multidetector CT imaging of the abdomen and pelvis  was performed following the standard protocol without IV contrast. COMPARISON:  CT the abdomen and pelvis 01/24/2020. FINDINGS: Lower chest: Large hiatal hernia. Areas of scarring or atelectasis in the lower lobes of the lungs bilaterally. Hepatobiliary: No definite cystic or solid hepatic lesions are confidently identified on today's noncontrast CT examination. Status post cholecystectomy. Pancreas: No definite pancreatic mass or peripancreatic fluid collections or inflammatory changes are noted on today's noncontrast CT examination. Spleen: Unremarkable. Adrenals/Urinary Tract: Several tiny stone fragments are noted within the left renal collecting system measuring 1-3 mm in size. In addition, at the left ureteropelvic junction (coronal image 76 of series 4) there is a 6 mm calculus. This is associated with mild-to-moderate proximal hydronephrosis. In addition, in the distal third of the left ureter (coronal image 73 of series 4) there is a 4 mm calculus. No significant hydroureter is throughout the proximal and mid left ureter at this time. Tiny 1 mm nonobstructive calculi are present within the upper pole collecting system of the right kidney. Compared to the prior examination there has been interval right percutaneous nephrostomy, with a safety catheter which extends from the right renal collecting system into the distal third of the right ureter. No definite stone fragments are appreciated along the course of the right ureter, or within the lumen of the urinary bladder. No right hydroureteronephrosis. Small amount of gas within the right renal collecting system, presumably iatrogenic. Bilateral kidneys and adrenal glands are otherwise unremarkable in appearance. Urinary bladder is nearly completely decompressed around an indwelling Foley balloon catheter. Small amount of gas non dependently within the lumen of the urinary bladder is iatrogenic. Stomach/Bowel: Unenhanced appearance of the intra-abdominal  portion of the stomach is unremarkable. No pathologic dilatation of small bowel or colon. Numerous colonic diverticulae are noted, particularly in the sigmoid colon, without surrounding inflammatory changes to suggest an acute diverticulitis at this time. Normal appendix. Vascular/Lymphatic: No atherosclerotic calcifications in the abdominal aorta or pelvic vasculature. No lymphadenopathy noted in the abdomen or pelvis. Reproductive: Prostate gland and seminal vesicles are unremarkable in appearance. Other: Small amount of high attenuation fluid in the posterior aspect of the right retroperitoneum outside of Gerota's fascia, which likely represents some mild postprocedural hemorrhage following percutaneous nephrostomy no significant volume of ascites. No pneumoperitoneum. Musculoskeletal: Status post PLIF from L2-S1 with interbody cages at L2-L3. There are no aggressive appearing lytic or blastic lesions noted in the visualized portions of the skeleton. IMPRESSION: 1. Tiny residual nonobstructive stone fragments in the collecting systems of the kidneys bilaterally, in this patient status post lithotripsy and right-sided percutaneous nephrolithotomy. There is a small amount of high attenuation fluid in the posterior aspect of the right retroperitoneum, presumably mild postprocedural hemorrhage. In addition, there are 2 small stone fragments, 1 at the left ureteropelvic junction which appears mildly obstructive, and 1 in the distal third of the left ureter which is nonobstructive, as detailed above. 2. Colonic diverticulosis without evidence of acute diverticulitis at this time. 3. Large hiatal hernia. 4. Additional incidental findings, as above. Electronically Signed   By: Trudie Reed  M.D.   On: 06/25/2020 07:14   IR URETERAL STENT RIGHT NEW ACCESS W/O SEP NEPHROSTOMY CATH  Result Date: 06/23/2020 INDICATION: History of prior bilateral staghorn renal calculi and status post left percutaneous nephrolithotomy  in September. The patient now presents for percutaneous access prior to planned percutaneous nephrolithotomy of a right lower pole partial staghorn calculus tomorrow. EXAM: 1. ULTRASOUND GUIDANCE FOR PUNCTURE OF THE RIGHT RENAL COLLECTING SYSTEM 2. PLACEMENT OF RIGHT-SIDED URETERAL CATHETER VIA PERCUTANEOUS ACCESS OF THE RIGHT KIDNEY WITHOUT SEPARATE NEPHROSTOMY TUBE PLACEMENT COMPARISON:  None. MEDICATIONS: 400 mg IV Cipro; The antibiotic was administered in an appropriate time frame prior to skin puncture. ANESTHESIA/SEDATION: Fentanyl 100 mcg IV; Versed 4.0 mg IV Moderate Sedation Time:  30 minutes. The patient was continuously monitored during the procedure by the interventional radiology nurse under my direct supervision. CONTRAST:  20 mL Omnipaque 300-administered into the collecting system(s) FLUOROSCOPY TIME:  Fluoroscopy Time: 6 minutes and 54 seconds. 93.0 mGy. COMPLICATIONS: None immediate. PROCEDURE: Informed written consent was obtained from the patient after a thorough discussion of the procedural risks, benefits and alternatives. All questions were addressed. Maximal Sterile Barrier Technique was utilized including caps, mask, sterile gowns, sterile gloves, sterile drape, hand hygiene and skin antiseptic. A timeout was performed prior to the initiation of the procedure. Under ultrasound guidance, a 21 gauge needle was advanced into the lower pole collecting system of the right kidney. Ultrasound image documentation was performed. Contrast was injected into the collecting system. Access of a lower pole calyx was made under fluoroscopic guidance with a second 21 gauge needle. Contrast was injected. Attempt was made to advance a guidewire. Eventually, a guidewire was able to be advanced into the collecting system. A transitional dilator was placed. A 5 French catheter was advanced over the wire into the collecting system and down the ureter. The catheter was position just into the bladder. The catheter  was capped and secured at the skin with a silk retention suture. An overlying dressing was placed. FINDINGS: Lower pole calculus extending into a lower pole infundibulum was visible by fluoroscopy. After opacification of the collecting system, initial lower pole access at the level of a peripheral calyx was able to be accomplished. However, a guidewire would not pass beyond the calculus and into the central collecting system from this approach. A slightly higher approach was therefore taken just along the inferior margin of the calculus which allowed passage of a guidewire and eventually catheter into the central collecting system and down the ureter. IMPRESSION: Percutaneous right-sided renal access and ureteral catheter placement for nephrolithotomy. Lower pole access was performed along the inferior margin of a calculus occupying the lower pole collecting system extending into a lower pole infundibulum. Electronically Signed   By: Irish Lack M.D.   On: 06/23/2020 16:50    Disposition: Discharge disposition: 01-Home or Self Care       Discharge Instructions    Discontinue IV   Complete by: As directed        Follow-up Information    Hillery Aldo, NP On 07/02/2020.   Specialty: Nurse Practitioner Why: 9:30a Contact information: [address] [place] [address] [phone]        [facility] Follow up.   Why: The office will call to let you know when to come of Friday to have the tube removed.  Contact information: [address] [place] [address] [place] [address] [phone]  Signed: Bjorn Pippin 06/25/2020, 2:36 PM

## 2020-06-27 DIAGNOSIS — N200 Calculus of kidney: Secondary | ICD-10-CM | POA: Diagnosis not present

## 2020-07-31 ENCOUNTER — Other Ambulatory Visit: Payer: Self-pay | Admitting: Urology

## 2020-08-04 NOTE — Progress Notes (Signed)
DUE TO COVID-19 ONLY ONE VISITOR IS ALLOWED TO COME WITH YOU AND STAY IN THE WAITING ROOM ONLY DURING PRE OP AND PROCEDURE DAY OF SURGERY. THE 1 VISITOR  MAY VISIT WITH YOU AFTER SURGERY IN YOUR PRIVATE ROOM DURING VISITING HOURS ONLY!  YOU NEED TO HAVE A COVID 19 TEST ON_12/14/2021 ______ @_______ , THIS TEST MUST BE DONE BEFORE SURGERY,  COVID TESTING SITE [address] [place] , IT IS ON THE RIGHT GOING OUT [address] APPROXIMATELY  2 MINUTES PAST [employer] ON THE RIGHT. ONCE YOUR COVID TEST IS COMPLETED,  PLEASE BEGIN THE QUARANTINE INSTRUCTIONS AS OUTLINED IN YOUR HANDOUT.                Anthony [name]  08/04/2020   Your procedure is scheduled on: 08/07/2020    Report to [facility] Main  Entrance   Report to admitting at     0530 AM     Call this number if you have problems the morning of surgery [phone]    Remember: Do not eat food , candy gum or mints :After Midnight. You may have clear liquids from midnight until  0430am     CLEAR LIQUID DIET   Foods Allowed                                                                       Coffee and tea, regular and decaf                              Plain Jell-O any favor except red or purple                                            Fruit ices (not with fruit pulp)                                      Iced Popsicles                                     Carbonated beverages, regular and diet                                    Cranberry, grape and apple juices Sports drinks like Gatorade Lightly seasoned clear broth or consume(fat free) Sugar, honey syrup   _____________________________________________________________________    BRUSH YOUR TEETH MORNING OF SURGERY AND RINSE YOUR MOUTH OUT, NO CHEWING GUM CANDY OR MINTS.     Take these medicines the morning of surgery with A SIP OF WATER: Nexium, Urocit, Flomax   DO NOT TAKE ANY DIABETIC MEDICATIONS DAY OF YOUR SURGERY                                You may not have  any metal on your body including hair pins and              piercings  Do not wear jewelry, make-up, lotions, powders or perfumes, deodorant             Do not wear nail polish on your fingernails.  Do not shave  48 hours prior to surgery.              Men may shave face and neck.   Do not bring valuables to the hospital. [place].  Contacts, dentures or bridgework may not be worn into surgery.  Leave suitcase in the car. After surgery it may be brought to your room.     Patients discharged the day of surgery will not be allowed to drive home. IF YOU ARE HAVING SURGERY AND GOING HOME THE SAME DAY, YOU MUST HAVE AN ADULT TO DRIVE YOU HOME AND BE WITH YOU FOR 24 HOURS. YOU MAY GO HOME BY TAXI OR UBER OR ORTHERWISE, BUT AN ADULT MUST ACCOMPANY YOU HOME AND STAY WITH YOU FOR 24 HOURS.  Name and phone number of your driver:  Special Instructions: N/A              Please read over the following fact sheets you were given: _____________________________________________________________________  [facility] - Preparing for Surgery Before surgery, you can play an important role.  Because skin is not sterile, your skin needs to be as free of germs as possible.  You can reduce the number of germs on your skin by washing with CHG (chlorahexidine gluconate) soap before surgery.  CHG is an antiseptic cleaner which kills germs and bonds with the skin to continue killing germs even after washing. Please DO NOT use if you have an allergy to CHG or antibacterial soaps.  If your skin becomes reddened/irritated stop using the CHG and inform your nurse when you arrive at Short Stay. Do not shave (including legs and underarms) for at least 48 hours prior to the first CHG shower.  You may shave your face/neck. Please follow these instructions carefully:  1.  Shower with CHG Soap the night before surgery and the  morning of  Surgery.  2.  If you choose to wash your hair, wash your hair first as usual with your  normal  shampoo.  3.  After you shampoo, rinse your hair and body thoroughly to remove the  shampoo.                           4.  Use CHG as you would any other liquid soap.  You can apply chg directly  to the skin and wash                       Gently with a scrungie or clean washcloth.  5.  Apply the CHG Soap to your body ONLY FROM THE NECK DOWN.   Do not use on face/ open                           Wound or open sores. Avoid contact with eyes, ears mouth and genitals (private parts).                       Wash  face,  Genitals (private parts) with your normal soap.             6.  Wash thoroughly, paying special attention to the area where your surgery  will be performed.  7.  Thoroughly rinse your body with warm water from the neck down.  8.  DO NOT shower/wash with your normal soap after using and rinsing off  the CHG Soap.                9.  Pat yourself dry with a clean towel.            10.  Wear clean pajamas.            11.  Place clean sheets on your bed the night of your first shower and do not  sleep with pets. Day of Surgery : Do not apply any lotions/deodorants the morning of surgery.  Please wear clean clothes to the hospital/surgery center.  FAILURE TO FOLLOW THESE INSTRUCTIONS MAY RESULT IN THE CANCELLATION OF YOUR SURGERY PATIENT SIGNATURE_________________________________  NURSE SIGNATURE__________________________________  ________________________________________________________________________

## 2020-08-05 ENCOUNTER — Encounter (HOSPITAL_COMMUNITY)
Admission: RE | Admit: 2020-08-05 | Discharge: 2020-08-05 | Disposition: A | Discharge status: Home / Self Care | Payer: BC Managed Care – PPO | Source: Ambulatory Visit | Attending: Anesthesiology | Admitting: Anesthesiology

## 2020-08-05 ENCOUNTER — Other Ambulatory Visit (HOSPITAL_COMMUNITY): Payer: BC Managed Care – PPO

## 2020-09-02 NOTE — Patient Instructions (Signed)
DUE TO COVID-19 ONLY ONE VISITOR IS ALLOWED TO COME WITH YOU AND STAY IN THE WAITING ROOM ONLY DURING PRE OP AND PROCEDURE DAY OF SURGERY. THE 1 VISITOR  MAY VISIT WITH YOU AFTER SURGERY IN YOUR PRIVATE ROOM DURING VISITING HOURS ONLY!  YOU NEED TO HAVE A COVID 19 TEST ON: 09/03/20 @ 9:30 AM , THIS TEST MUST BE DONE BEFORE SURGERY,  COVID TESTING SITE [address] [place] [address], IT IS ON THE RIGHT GOING OUT [address] APPROXIMATELY  2 MINUTES PAST [employer] ON THE RIGHT. ONCE YOUR COVID TEST IS COMPLETED,  PLEASE BEGIN THE QUARANTINE INSTRUCTIONS AS OUTLINED IN YOUR HANDOUT.                Anthony [name]    Your procedure is scheduled on: 09/05/20   Report to [facility] Main  Entrance   Report to short stay at: 5:30 AM     Call this number if you have problems the morning of surgery [phone]    Remember: Do not eat food or drink liquids :After Midnight.   BRUSH YOUR TEETH MORNING OF SURGERY AND RINSE YOUR MOUTH OUT, NO CHEWING GUM CANDY OR MINTS.    Take these medicines the morning of surgery with A SIP OF WATER: esomeprazole,tamsulosin.                               You may not have any metal on your body including hair pins and              piercings  Do not wear jewelry,lotions, powders or perfumes, deodorant             Men may shave face and neck.   Do not bring valuables to the hospital. [place] [facility] IS NOT             RESPONSIBLE   FOR VALUABLES.  Contacts, dentures or bridgework may not be worn into surgery.  Leave suitcase in the car. After surgery it may be brought to your room.     Patients discharged the day of surgery will not be allowed to drive home. IF YOU ARE HAVING SURGERY AND GOING HOME THE SAME DAY, YOU MUST HAVE AN ADULT TO DRIVE YOU HOME AND BE WITH YOU FOR 24 HOURS. YOU MAY GO HOME BY TAXI OR UBER OR ORTHERWISE, BUT AN ADULT MUST ACCOMPANY YOU HOME AND STAY WITH YOU FOR 24 HOURS.  Name and phone number of your  driver:  Special Instructions: N/A              Please read over the following fact sheets you were given: _____________________________________________________________________          [facility] - Preparing for Surgery Before surgery, you can play an important role.  Because skin is not sterile, your skin needs to be as free of germs as possible.  You can reduce the number of germs on your skin by washing with CHG (chlorahexidine gluconate) soap before surgery.  CHG is an antiseptic cleaner which kills germs and bonds with the skin to continue killing germs even after washing. Please DO NOT use if you have an allergy to CHG or antibacterial soaps.  If your skin becomes reddened/irritated stop using the CHG and inform your nurse when you arrive at Short Stay. Do not shave (including legs and underarms) for at least 48 hours prior to the  first CHG shower.  You may shave your face/neck. Please follow these instructions carefully:  1.  Shower with CHG Soap the night before surgery and the  morning of Surgery.  2.  If you choose to wash your hair, wash your hair first as usual with your  normal  shampoo.  3.  After you shampoo, rinse your hair and body thoroughly to remove the  shampoo.                           4.  Use CHG as you would any other liquid soap.  You can apply chg directly  to the skin and wash                       Gently with a scrungie or clean washcloth.  5.  Apply the CHG Soap to your body ONLY FROM THE NECK DOWN.   Do not use on face/ open                           Wound or open sores. Avoid contact with eyes, ears mouth and genitals (private parts).                       Wash face,  Genitals (private parts) with your normal soap.             6.  Wash thoroughly, paying special attention to the area where your surgery  will be performed.  7.  Thoroughly rinse your body with warm water from the neck down.  8.  DO NOT shower/wash with your normal soap after using and rinsing off   the CHG Soap.                9.  Pat yourself dry with a clean towel.            10.  Wear clean pajamas.            11.  Place clean sheets on your bed the night of your first shower and do not  sleep with pets. Day of Surgery : Do not apply any lotions/deodorants the morning of surgery.  Please wear clean clothes to the hospital/surgery center.  FAILURE TO FOLLOW THESE INSTRUCTIONS MAY RESULT IN THE CANCELLATION OF YOUR SURGERY PATIENT SIGNATURE_________________________________  NURSE SIGNATURE__________________________________  ________________________________________________________________________

## 2020-09-03 ENCOUNTER — Encounter (HOSPITAL_COMMUNITY)
Admission: RE | Admit: 2020-09-03 | Discharge: 2020-09-03 | Disposition: A | Discharge status: Home / Self Care | Payer: Medicare Other | Source: Ambulatory Visit | Attending: Urology | Admitting: Urology

## 2020-09-03 ENCOUNTER — Other Ambulatory Visit: Payer: Self-pay

## 2020-09-03 ENCOUNTER — Encounter (HOSPITAL_COMMUNITY): Payer: Self-pay

## 2020-09-03 ENCOUNTER — Other Ambulatory Visit (HOSPITAL_COMMUNITY)
Admission: RE | Admit: 2020-09-03 | Discharge: 2020-09-03 | Disposition: A | Discharge status: Home / Self Care | Payer: Medicare Other | Source: Ambulatory Visit | Attending: Urology | Admitting: Urology

## 2020-09-03 DIAGNOSIS — Z01812 Encounter for preprocedural laboratory examination: Secondary | ICD-10-CM | POA: Insufficient documentation

## 2020-09-03 DIAGNOSIS — U071 COVID-19: Secondary | ICD-10-CM | POA: Diagnosis not present

## 2020-09-03 LAB — CBC
HCT: 46.4 % (ref 39.0–52.0)
Hemoglobin: 14.6 g/dL (ref 13.0–17.0)
MCH: 27.6 pg (ref 26.0–34.0)
MCHC: 31.5 g/dL (ref 30.0–36.0)
MCV: 87.7 fL (ref 80.0–100.0)
Platelets: 168 10*3/uL (ref 150–400)
RBC: 5.29 MIL/uL (ref 4.22–5.81)
RDW: 13.5 % (ref 11.5–15.5)
WBC: 4.6 10*3/uL (ref 4.0–10.5)
nRBC: 0 % (ref 0.0–0.2)

## 2020-09-03 LAB — BASIC METABOLIC PANEL
Anion gap: 7 (ref 5–15)
BUN: 20 mg/dL (ref 8–23)
CO2: 26 mmol/L (ref 22–32)
Calcium: 9.6 mg/dL (ref 8.9–10.3)
Chloride: 107 mmol/L (ref 98–111)
Creatinine, Ser: 1.45 mg/dL — ABNORMAL HIGH (ref 0.61–1.24)
GFR, Estimated: 52 mL/min — ABNORMAL LOW (ref >60)
Glucose, Bld: 96 mg/dL (ref 70–99)
Potassium: 4.7 mmol/L (ref 3.5–5.1)
Sodium: 140 mmol/L (ref 135–145)

## 2020-09-03 LAB — SARS CORONAVIRUS 2 (TAT 6-24 HRS): SARS Coronavirus 2: POSITIVE — AB

## 2020-09-03 NOTE — Progress Notes (Signed)
COVID Vaccine Completed: NO Date COVID Vaccine completed: COVID vaccine manufacturer: Pfizer    [facility] [employer]   PCP - Dr. Velna Ochs Cardiologist - NO  Chest x-ray - 01/24/20 EKG - 01/25/20 Stress Test -  ECHO -  Cardiac Cath -  Pacemaker/ICD device last checked:  Sleep Study -  CPAP -   Fasting Blood Sugar -  Checks Blood Sugar _____ times a day  Blood Thinner Instructions: Aspirin Instructions: Last Dose:  Anesthesia review: Hx: CKF III,mitral valve prolapse.  Patient denies shortness of breath, fever, cough and chest pain at PAT appointment   Patient verbalized understanding of instructions that were given to them at the PAT appointment. Patient was also instructed that they will need to review over the PAT instructions again at home before surgery.

## 2020-09-04 ENCOUNTER — Telehealth: Payer: Self-pay

## 2020-09-04 NOTE — Telephone Encounter (Signed)
Called to discuss with patient about COVID-19 symptoms and the use of one of the available treatments for those with mild to moderate Covid symptoms and at a high risk of hospitalization.  Pt appears to qualify for outpatient treatment due to co-morbid conditions and/or a member of an at-risk group in accordance with the FDA Emergency Use Authorization.    Symptom onset: Unknown Vaccinated: Unknown Booster? Unknown Immunocompromised? No Qualifiers: Age  Unable to reach pt - Left message and number [phone].   Anthony [name]

## 2020-09-04 NOTE — Progress Notes (Signed)
Left message with Pam regarding patient's positive Covid test. 09/04/20 [id number]

## 2020-09-04 NOTE — Progress Notes (Signed)
Pt. Was called to verify the change on surgical date.Pt. verbalized,he's aware that the surgery will be on 09/16/20 at 7:30 am,he knows he needs to arrive at 5:30 am.

## 2020-09-15 ENCOUNTER — Encounter (HOSPITAL_COMMUNITY): Payer: Self-pay | Admitting: Urology

## 2020-09-15 NOTE — Anesthesia Preprocedure Evaluation (Addendum)
Anesthesia Evaluation  Patient identified by MRN, date of birth, ID band Patient awake    Reviewed: Allergy & Precautions, NPO status , Patient's Chart, lab work & pertinent test results  Airway Mallampati: I  TM Distance: >3 FB Neck ROM: Full    Dental  (+) Chipped, Missing, Poor Dentition,    Pulmonary former smoker,    Pulmonary exam normal breath sounds clear to auscultation       Cardiovascular Normal cardiovascular exam+ Valvular Problems/Murmurs MVP  Rhythm:Regular Rate:Normal  HLD  TTE 12/2019 1. Left ventricular ejection fraction, by estimation, is 60 to 65%. The left ventricle has normal function. The left ventricle has no regional wall motion abnormalities. Left ventricular diastolic parameters are consistent with Grade I diastolic dysfunction (impaired relaxation).  2. Right ventricular systolic function is normal. The right ventricular size is normal. Tricuspid regurgitation signal is inadequate for assessing PA pressure.  3. The mitral valve is normal in structure. No evidence of mitral valve regurgitation. No evidence of mitral stenosis.  4. The aortic valve is tricuspid. Aortic valve regurgitation is not visualized. No aortic stenosis is present.  5. The inferior vena cava is normal in size with greater than 50% respiratory variability, suggesting right atrial pressure of 3 mmHg.   Neuro/Psych    GI/Hepatic hiatal hernia, GERD  Medicated,  Endo/Other    Renal/GU Renal InsufficiencyRenal disease (Cr 1.45, K 4.7)     Musculoskeletal  (+) Arthritis , S/p ACDF   Abdominal   Peds  Hematology   Anesthesia Other Findings Covid positive 09/03/20  Reproductive/Obstetrics                          Anesthesia Physical  Anesthesia Plan  ASA: III  Anesthesia Plan: General   Post-op Pain Management:    Induction: Intravenous  PONV Risk Score and Plan: 2 and Midazolam, Dexamethasone  and Ondansetron  Airway Management Planned: LMA  Additional Equipment:   Intra-op Plan:   Post-operative Plan: Extubation in OR  Informed Consent: I have reviewed the patients History and Physical, chart, labs and discussed the procedure including the risks, benefits and alternatives for the proposed anesthesia with the patient or authorized representative who has indicated his/her understanding and acceptance.     Dental advisory given  Plan Discussed with: CRNA  Anesthesia Plan Comments:        Anesthesia Quick Evaluation

## 2020-09-15 NOTE — H&P (Signed)
06/27/2020: Patient underwent PCNL to treat a large right-sided stone burden earlier this week. He returns today for follow-up exam and removal of nephrostomy catheter. Imaging studies at time of hospital discharge did suggest the possibility of a retained UPJ and distal left ureteral calculi/fragment from his left sided PCNL done in September. He has had no left-sided pain or discomfort. Patient voiding at his baseline with some intermittent hematuria today. Also with some associated mild urgency but no trouble with force of stream, burning or painful urination. He denies interval fevers or chills. He had some nausea yesterday but this was easily treated with medication he had on hand. He has had to take four RX pain medication since hospital discharge but this is effective in treating his symptoms. He has not had fevers or chills. Tolerating a normal diet.   He continues abx therapy prescribed in anticipation of today's appointment.   07/30/2020: He returns today for follow-up exam with KUB and renal ultrasound. Since last office visit he has had no additional pain/discomfort suggestive of obstructive uropathy. He is voiding at his baseline with stable non bothersome symptoms. He denies interval stone material passage, burning or painful urination, visible blood in the urine.     ALLERGIES: Codeine Derivatives Doxycycline Hyclate CAPS    MEDICATIONS: Omeprazole 40 mg capsule,delayed release  Potassium Citrate Er 10 meq (1,080 mg) tablet, extended release 1 tablet PO TID  Tamsulosin Hcl 0.4 mg capsule 2 capsule PO Daily     GU PSH: Cysto Uretero Lithotripsy - 2016, 2015, 2015, 2011 Cystoscopy Insert Stent - 2016, 2016, 2015, 2015, 2011, 2010 Cystoscopy Ureteroscopy - 2010 ESWL - 2016, 2015, 2011, 2011 Percut Stone Removal >2cm, Right - 06/24/2020, Left - 05/08/2020 Ureteroscopic stone removal - 2016, 2010       PSH Notes: Cholecystectomy, Cystoscopy With Insertion Of Ureteral Stent Left,  Cystoscopy With Ureteroscopy With Lithotripsy, Cystoscopy With Ureteroscopy With Removal Of Calculus, Lithotripsy, Cystoscopy With Insertion Of Ureteral Stent Bilateral, Cystoscopy With Ureteroscopy With Lithotripsy, Cystoscopy With Insertion Of Ureteral Stent Right, Cystoscopy With Insertion Of Ureteral Stent Right, Cystoscopy With Ureteroscopy With Lithotripsy, Lithotripsy, Back Surgery, Cystoscopy With Insertion Of Ureteral Stent Left, Lithotripsy, Cystoscopy With Ureteroscopy With Lithotripsy, Lithotripsy, Cystoscopy With Ureteroscopy With Removal Of Calculus, Cystoscopy With Ureteroscopy Right, Cystoscopy With Insertion Of Ureteral Stent Right, Neck Surgery, Arm Incision, Lower Back Surgery   NON-GU PSH: Cholecystectomy (open) - 2016     GU PMH: Renal calculus - 06/27/2020, Left sided stone has been treated. Uncomplicated removal of his nephrostomy catheter and ureteral stent. He has a known similar sized calculus on the right side with plans to repeat PCNL in the next few weeks., - 05/12/2020, He has stable stones. KUB in 1 year. , - 2020, His stone is stable. he will continue the potassium citrate. BMP in 6 months. , - 2019 (Chronic), Bilateral, He passes 1-2 small stones every year without much difficulty., - 2019, Bilateral kidney stones, - 2016, Kidney stone on left side, - 2016, Kidney stone on right side, - 2015 BPH w/LUTS, He is content with the tamsulosin and will continue that. - 04/02/2020, (Worsening), He has post op retention with overflow incontinence. I am going to leave the foley for a week and have him double the tamsulosin. he will return for a voiding trial. , - 02/07/2019, He is doing well on tamsulosin. he will return in year with a PSA. , - 2020, He continues to void well on tamsulosin. , - 2019 Acute Cystitis/UTI -  03/06/2019 Urinary Retention - 02/14/2019, - 2019 Nocturia - 2019 Encounter for Prostate Cancer screening, Prostate cancer screening - 2016 Abdominal Pain Unspec, Right  flank pain - 2016 Personal Hx Oth Urinary System diseases, History of acute renal failure - 2016 Hydronephrosis Unspec, Hydronephrosis, right - 2016, Hydronephrosis, left, - 2016, Hydronephrosis, bilateral, - 2016 Ureteral calculus, Ureteral calculus, left - 2016, Ureteral calculus, right, - 2016, Calculus of right ureter, - 2015, Ureteral Stone, - 2014, Proximal Ureteral Stone On The Right, - 2014 Urinary Tract Inf, Unspec site, Urinary tract infection - 2016 Other microscopic hematuria, Microscopic hematuria - 2016 RLQ pain, Abdominal pain, RLQ (right lower quadrant) - 2016 History of urolithiasis, Nephrolithiasis - 2014 Personal Hx Urinary Tract Infections, History of acute pyelonephritis - 2014 Ureteral obstruction, Hydronephrosis with ureteral stricture, not elsewhere classified - 2014 Ureteral stricture, Ureteral Stricture - 2014      PMH Notes:  2010-01-16 10:09:33 - Note: Hematoma Of The Left Kidney  2009-02-28 11:46:15 - Note: Arthritis   NON-GU PMH: Encounter for general adult medical examination without abnormal findings, Encounter for preventive health examination - 2016 Nausea with vomiting, unspecified, Nausea and vomiting - 2016 Nausea, Nausea - 2015 Radiculopathy, lumbar region, Lumbar Radiculopathy - 2014    FAMILY HISTORY: Colon Cancer - Mother, Aunt, Aunt, Uncle Death In The Family Aunt - Aunt, Aunt Death In The Family Father - Runs In Family Death In The Family [facility] Number - Runs In Family Hematuria - Mother nephrolithiasis - Mother Stroke Syndrome - Mother, Father   SOCIAL HISTORY: Marital Status: Widowed Preferred Language: English Current Smoking Status: Patient has never smoked.   Tobacco Use Assessment Completed: Used Tobacco in last 30 days? Drinks 1 drink per month. Types of alcohol consumed: Liquor. Social Drinker.  Drinks 4+ caffeinated drinks per day.     Notes: Occupation:, Previous History Of Smoking,  Being A Social Drinker, Caffeine Use, Marital History - Widowed   REVIEW OF SYSTEMS:    GU Review Male:   Patient denies frequent urination, hard to postpone urination, burning/ pain with urination, get up at night to urinate, leakage of urine, stream starts and stops, trouble starting your stream, have to strain to urinate , erection problems, and penile pain.  Gastrointestinal (Upper):   Patient denies nausea, vomiting, and indigestion/ heartburn.  Gastrointestinal (Lower):   Patient denies diarrhea and constipation.  Constitutional:   Patient denies fever, night sweats, weight loss, and fatigue.  Skin:   Patient denies skin rash/ lesion and itching.  Eyes:   Patient denies blurred vision and double vision.  Ears/ Nose/ Throat:   Patient denies sore throat and sinus problems.  Hematologic/Lymphatic:   Patient denies swollen glands and easy bruising.  Cardiovascular:   Patient denies leg swelling and chest pains.  Respiratory:   Patient denies cough and shortness of breath.  Endocrine:   Patient denies excessive thirst.  Musculoskeletal:   Patient reports back pain. Patient denies joint pain.  Neurological:   Patient denies headaches and dizziness.  Psychologic:   Patient denies depression and anxiety.   VITAL SIGNS:      07/30/2020 08:41 AM  Weight 180 lb / 81.65 kg  BP 122/71 mmHg  Pulse 81 /min   MULTI-SYSTEM PHYSICAL EXAMINATION:    Constitutional: Well-nourished. No physical deformities. Normally developed. Good grooming.  Neck: Neck symmetrical, not swollen. Normal tracheal position.  Respiratory: No labored breathing, no use of accessory muscles.   Cardiovascular: Normal temperature, normal extremity  pulses, no swelling, no varicosities.  Skin: No paleness, no jaundice, no cyanosis. No lesion, no ulcer, no rash.  Neurologic / Psychiatric: Oriented to time, oriented to place, oriented to person. No depression, no anxiety, no agitation.  Gastrointestinal: No mass, no tenderness,  no rigidity, non obese abdomen. Well healed nephrostomy catheter sites bilaterally. No palpable tenderness noted.  Musculoskeletal: Normal gait and station of head and neck.     Complexity of Data:  Source Of History:  Patient, Medical Record Summary  Records Review:   Previous Doctor Records, Previous Hospital Records, Previous Patient Records  Urine Test Review:   Urinalysis, Urine Culture  X-Ray Review: KUB: Reviewed Films. Discussed With Patient.  Renal Ultrasound: Reviewed Films. Discussed With Patient.     12/20/18 05/22/15  PSA  Total PSA 0.71 ng/mL 1.05     07/30/20 07/30/20  Urinalysis  Urine Appearance Clear  Clear   Urine Color Yellow  Yellow   Urine Glucose Neg  Neg mg/dL  Urine Bilirubin Neg  Neg mg/dL  Urine Ketones Neg  Neg mg/dL  Urine Specific Gravity 1.025  1.025   Urine Blood Neg  Neg ery/uL  Urine pH 6.0  6.0   Urine Protein Trace  Trace mg/dL  Urine Urobilinogen 0.2  0.2 mg/dL  Urine Nitrites Neg  Neg   Urine Leukocyte Esterase Neg  Neg leu/uL   PROCEDURES:         KUB - 74018  A single view of the abdomen is obtained. The bilateral renal shadows grossly appear clear. The anatomical expected tract of the right ureter shows no concerning opacity suggestive a ureteral calculus. Tracing down the anatomical expected tract of the left ureter there is a grain of rice appearing opacity noted lateral to L3 transverse process that was not seen on previous KUB imaging from August. This correlates with the shadowing opacity in the UPJ area see on today's U/S study. No other obvious opacities consistent with a ureteral calculi are noted on today's exam. He has multiple pelvic phleboliths especially on the left side but does grossly appear stable compared to previous KUB imaging studies. No obvious bladder calculi noted on today's exam. Patient with stable degenerative changes noted along lumbar spine with grossly stable appearing spinal fixation hardware.      Patient  confirmed No Neulasta OnPro Device.            Renal Ultrasound - [id number]  Right Kidney: Length: 11.7 cm Depth: 6.2 cm Cortical Width: 1.5 cm Width: 5.0 cm  Left Kidney: Length: 11.6 cm Depth: 6.2 cm Cortical Width: 1.4 cm Width: 4.4 cm  Left Kidney/Ureter:  Mild hydro. Multiple calcifications  Right Kidney/Ureter:  Within Normal Limits   Bladder:  Emptied. Not visualized.      Patient confirmed No Neulasta OnPro Device.           Urinalysis - 81003 Dipstick Dipstick Cont'd  Color: Yellow Bilirubin: Neg  Appearance: Clear Ketones: Neg  Specific Gravity: 1.025 Blood: Neg  pH: 6.0 Protein: Trace  Glucose: Neg Urobilinogen: 0.2    Nitrites: Neg    Leukocyte Esterase: Neg    Notes:      ASSESSMENT:      ICD-10 Details  1 GU:   History of urolithiasis - Z87.442 Chronic, Stable  2   Ureteral calculus - N20.1 Left, Undiagnosed New Problem   PLAN:           Orders Labs Urine Culture  Schedule Return Visit/Planned Activity: Other See Visit Notes - Follow up MD, Schedule Surgery             Note: Patient be contacted regarding once I discuss today's imaging findings with Dr Annabell Howells.          Document Letter(s):  Created for Patient: Clinical Summary         Notes:   Patient continues to do well without bothersome pain/discomfort suggestive of obstructive uropathy, new or worsening lower urinary tract symptoms. On ultrasound today there is some noted hydronephrosis on the left side which may be a chronic finding based on my previous discussion with his urologist. However there is a likely obstructing calculi in the area of the left UPJ which is also seen on today's KUB study.   Pt will likely need another procedure to treat today's findings. I will discuss this with Dr Annabell Howells before having the patient contacted with recommended plan of care moving forward.   For ureteroscopy I described the risks which include heart attack, stroke, pulmonary embolus, death,  bleeding, infection, damage to contiguous structures, positioning injury, ureteral stricture, ureteral avulsion, ureteral injury, need for ureteral stent, inability to perform ureteroscopy, need for an interval procedure, inability to clear stone burden, stent discomfort and pain.   Once obstructing stone burden is treated, I will proceed with a metabolic evaluation to formulate a definitive stone prevention strategy moving. We discussed this in detail today and He is agreeable with proceeding.

## 2020-09-16 ENCOUNTER — Ambulatory Visit (HOSPITAL_COMMUNITY): Payer: Medicare Other | Admitting: Physician Assistant

## 2020-09-16 ENCOUNTER — Ambulatory Visit (HOSPITAL_COMMUNITY)
Admission: RE | Admit: 2020-09-16 | Discharge: 2020-09-16 | Disposition: A | Discharge status: Home / Self Care | Payer: Medicare Other | Attending: Urology | Admitting: Urology

## 2020-09-16 ENCOUNTER — Ambulatory Visit (HOSPITAL_COMMUNITY): Payer: Medicare Other

## 2020-09-16 ENCOUNTER — Encounter (HOSPITAL_COMMUNITY)
Admission: RE | Disposition: A | Discharge status: Home / Self Care | Payer: Self-pay | Source: Home / Self Care | Attending: Urology

## 2020-09-16 ENCOUNTER — Ambulatory Visit (HOSPITAL_COMMUNITY): Payer: Medicare Other | Admitting: Anesthesiology

## 2020-09-16 ENCOUNTER — Encounter (HOSPITAL_COMMUNITY): Payer: Self-pay | Admitting: Urology

## 2020-09-16 DIAGNOSIS — Z87442 Personal history of urinary calculi: Secondary | ICD-10-CM | POA: Insufficient documentation

## 2020-09-16 DIAGNOSIS — Z885 Allergy status to narcotic agent status: Secondary | ICD-10-CM | POA: Insufficient documentation

## 2020-09-16 DIAGNOSIS — Z79899 Other long term (current) drug therapy: Secondary | ICD-10-CM | POA: Diagnosis not present

## 2020-09-16 DIAGNOSIS — Z881 Allergy status to other antibiotic agents status: Secondary | ICD-10-CM | POA: Insufficient documentation

## 2020-09-16 DIAGNOSIS — N201 Calculus of ureter: Secondary | ICD-10-CM | POA: Diagnosis present

## 2020-09-16 HISTORY — PX: CYSTOSCOPY/URETEROSCOPY/HOLMIUM LASER/STENT PLACEMENT: SHX6546

## 2020-09-16 SURGERY — CYSTOSCOPY/URETEROSCOPY/HOLMIUM LASER/STENT PLACEMENT
Anesthesia: General | Laterality: Left

## 2020-09-16 MED ORDER — CEFAZOLIN SODIUM-DEXTROSE 2-4 GM/100ML-% IV SOLN
2.0000 g | INTRAVENOUS | Status: AC
  Administered 2020-09-16: 2 g via INTRAVENOUS
  Filled 2020-09-16: qty 100

## 2020-09-16 MED ORDER — SODIUM CHLORIDE 0.9 % IR SOLN
Status: DC | PRN
  Administered 2020-09-16: 3000 mL

## 2020-09-16 MED ORDER — PROPOFOL 10 MG/ML IV BOLUS
INTRAVENOUS | Status: AC
  Filled 2020-09-16: qty 40

## 2020-09-16 MED ORDER — IOHEXOL 300 MG/ML  SOLN
INTRAMUSCULAR | Status: DC | PRN
  Administered 2020-09-16: 7 mL

## 2020-09-16 MED ORDER — DEXAMETHASONE SODIUM PHOSPHATE 10 MG/ML IJ SOLN
INTRAMUSCULAR | Status: DC | PRN
  Administered 2020-09-16: 4 mg via INTRAVENOUS

## 2020-09-16 MED ORDER — LIDOCAINE 2% (20 MG/ML) 5 ML SYRINGE
INTRAMUSCULAR | Status: DC | PRN
  Administered 2020-09-16: 60 mg via INTRAVENOUS

## 2020-09-16 MED ORDER — SODIUM CHLORIDE 0.9% FLUSH: 3.0000 mL | Freq: Two times a day (BID) | INTRAVENOUS | Status: DC

## 2020-09-16 MED ORDER — CHLORHEXIDINE GLUCONATE 0.12 % MT SOLN
15.0000 mL | Freq: Once | OROMUCOSAL | Status: AC
  Administered 2020-09-16: 15 mL via OROMUCOSAL

## 2020-09-16 MED ORDER — EPHEDRINE 5 MG/ML INJ
INTRAVENOUS | Status: AC
  Filled 2020-09-16: qty 10

## 2020-09-16 MED ORDER — OXYCODONE-ACETAMINOPHEN 5-325 MG PO TABS: 1.0000 | ORAL_TABLET | ORAL | 0 refills | Status: AC | PRN

## 2020-09-16 MED ORDER — ONDANSETRON HCL 4 MG/2ML IJ SOLN
INTRAMUSCULAR | Status: DC | PRN
  Administered 2020-09-16: 4 mg via INTRAVENOUS

## 2020-09-16 MED ORDER — FENTANYL CITRATE (PF) 100 MCG/2ML IJ SOLN
INTRAMUSCULAR | Status: AC
  Filled 2020-09-16: qty 2

## 2020-09-16 MED ORDER — ORAL CARE MOUTH RINSE: 15.0000 mL | Freq: Once | OROMUCOSAL | Status: AC

## 2020-09-16 MED ORDER — FENTANYL CITRATE (PF) 100 MCG/2ML IJ SOLN
INTRAMUSCULAR | Status: DC | PRN
  Administered 2020-09-16: 25 ug via INTRAVENOUS
  Administered 2020-09-16: 50 ug via INTRAVENOUS
  Administered 2020-09-16: 25 ug via INTRAVENOUS

## 2020-09-16 MED ORDER — EPHEDRINE SULFATE-NACL 50-0.9 MG/10ML-% IV SOSY
PREFILLED_SYRINGE | INTRAVENOUS | Status: DC | PRN
  Administered 2020-09-16 (×2): 7.5 mg via INTRAVENOUS

## 2020-09-16 MED ORDER — PROPOFOL 10 MG/ML IV BOLUS
INTRAVENOUS | Status: DC | PRN
  Administered 2020-09-16: 150 mg via INTRAVENOUS

## 2020-09-16 MED ORDER — LACTATED RINGERS IV SOLN: INTRAVENOUS | Status: DC

## 2020-09-16 SURGICAL SUPPLY — 22 items
BAG URO CATCHER STRL LF (MISCELLANEOUS) ×2 IMPLANT
BASKET STONE NCOMPASS (UROLOGICAL SUPPLIES) IMPLANT
CATH URET 5FR 28IN OPEN ENDED (CATHETERS) IMPLANT
CATH URET DUAL LUMEN 6-10FR 50 (CATHETERS) IMPLANT
CLOTH BEACON ORANGE TIMEOUT ST (SAFETY) ×2 IMPLANT
EXTRACTOR STONE NITINOL NGAGE (UROLOGICAL SUPPLIES) ×2 IMPLANT
GLOVE SURG SS PI 8.0 STRL IVOR (GLOVE) ×2 IMPLANT
GOWN STRL REUS W/TWL XL LVL3 (GOWN DISPOSABLE) ×2 IMPLANT
GUIDEWIRE STR DUAL SENSOR (WIRE) ×2 IMPLANT
IV NS IRRIG 3000ML ARTHROMATIC (IV SOLUTION) ×2 IMPLANT
KIT TURNOVER KIT A (KITS) IMPLANT
LASER FIB FLEXIVA PULSE ID 365 (Laser) IMPLANT
LASER FIB FLEXIVA PULSE ID 550 (Laser) IMPLANT
LASER FIB FLEXIVA PULSE ID 910 (Laser) IMPLANT
MANIFOLD NEPTUNE II (INSTRUMENTS) ×2 IMPLANT
PACK CYSTO (CUSTOM PROCEDURE TRAY) ×2 IMPLANT
SHEATH URETERAL 12FRX35CM (MISCELLANEOUS) ×2 IMPLANT
STENT URET 6FRX24 CONTOUR (STENTS) ×2 IMPLANT
TRACTIP FLEXIVA PULS ID 200XHI (Laser) ×1 IMPLANT
TRACTIP FLEXIVA PULSE ID 200 (Laser) ×2
TUBING CONNECTING 10 (TUBING) ×2 IMPLANT
TUBING UROLOGY SET (TUBING) ×2 IMPLANT

## 2020-09-16 NOTE — Transfer of Care (Signed)
Immediate Anesthesia Transfer of Care Note  Patient: Anthony [name]  Procedure(s) Performed: Procedure(s): CYSTOSCOPY LEFT RETROGRADE PYELOGRAM LEFT URETEROSCOPY/HOLMIUM LASER/STENT PLACEMENT (Left)  Patient Location: PACU  Anesthesia Type:General  Level of Consciousness:  sedated, patient cooperative and responds to stimulation  Airway & Oxygen Therapy:Patient Spontanous Breathing and Patient connected to face mask oxgen  Post-op Assessment:  Report given to PACU RN and Post -op Vital signs reviewed and stable  Post vital signs:  Reviewed and stable  Last Vitals:  Vitals:   09/16/20 0549 09/16/20 0815  BP: (!) 168/91 (!) 152/82  Pulse: 88 68  Resp: 16 19  Temp: 36.4 C   SpO2: 98% 99%    Complications: No apparent anesthesia complications

## 2020-09-16 NOTE — Interval H&P Note (Signed)
History and Physical Interval Note:  09/16/2020 7:13 AM  Anthony [name]  has presented today for surgery, with the diagnosis of LEFT URETERAL STONE.  The various methods of treatment have been discussed with the patient and family. After consideration of risks, benefits and other options for treatment, the patient has consented to  Procedure(s): CYSTOSCOPY LEFT RETROGRADE PYELOGRAM LEFT URETEROSCOPY/HOLMIUM LASER/STENT PLACEMENT (Left) as a surgical intervention.  The patient's history has been reviewed, patient examined, no change in status, stable for surgery.  I have reviewed the patient's chart and labs.  Questions were answered to the patient's satisfaction.     Bjorn Pippin

## 2020-09-16 NOTE — Discharge Instructions (Signed)
Ureteral Stent Implantation, Care After This sheet gives you information about how to care for yourself after your procedure. Your health care provider may also give you more specific instructions. If you have problems or questions, contact your health care provider. What can I expect after the procedure? After the procedure, it is common to have:  Nausea.  Mild pain when you urinate. You may feel this pain in your lower back or lower abdomen. The pain should stop within a few minutes after you urinate. This may last for up to 1 week.  A small amount of blood in your urine for several days. Follow these instructions at home: Medicines  Take over-the-counter and prescription medicines only as told by your health care provider.  If you were prescribed an antibiotic medicine, take it as told by your health care provider. Do not stop taking the antibiotic even if you start to feel better.  Do not drive for 24 hours if you were given a sedative during your procedure.  Ask your health care provider if the medicine prescribed to you requires you to avoid driving or using heavy machinery. Activity  Rest as told by your health care provider.  Avoid sitting for a long time without moving. Get up to take short walks every 1-2 hours. This is important to improve blood flow and breathing. Ask for help if you feel weak or unsteady.  Return to your normal activities as told by your health care provider. Ask your health care provider what activities are safe for you. General instructions  Watch for any blood in your urine. Call your health care provider if the amount of blood in your urine increases.  If you have a catheter: ? Follow instructions from your health care provider about taking care of your catheter and collection bag. ? Do not take baths, swim, or use a hot tub until your health care provider approves. Ask your health care provider if you may take showers. You may only be allowed to  take sponge baths.  Drink enough fluid to keep your urine pale yellow.  Do not use any products that contain nicotine or tobacco, such as cigarettes, e-cigarettes, and chewing tobacco. These can delay healing after surgery. If you need help quitting, ask your health care provider.  Keep all follow-up visits as told by your health care provider. This is important.   Contact a health care provider if:  You have pain that gets worse or does not get better with medicine, especially pain when you urinate.  You have difficulty urinating.  You feel nauseous or you vomit repeatedly during a period of more than 2 days after the procedure. Get help right away if:  Your urine is dark red or has blood clots in it.  You are leaking urine (have incontinence).  The end of the stent comes out of your urethra.  You cannot urinate.  You have sudden, sharp, or severe pain in your abdomen or lower back.  You have a fever.  You have swelling or pain in your legs.  You have difficulty breathing. Summary  After the procedure, it is common to have mild pain when you urinate that goes away within a few minutes after you urinate. This may last for up to 1 week.  Watch for any blood in your urine. Call your health care provider if the amount of blood in your urine increases.  Take over-the-counter and prescription medicines only as told by your health care provider.    Drink enough fluid to keep your urine pale yellow.  You may remove the stent on Friday by pulling the attached string.  Call the office if you don't feel comfortable doing that.   This information is not intended to replace advice given to you by your health care provider. Make sure you discuss any questions you have with your health care provider. Document Revised: 05/16/2018 Document Reviewed: 05/17/2018 Elsevier Patient Education  2021 [employer].

## 2020-09-16 NOTE — Anesthesia Procedure Notes (Signed)
Procedure Name: LMA Insertion Date/Time: 09/16/2020 7:47 AM Performed by: Theodosia Quay, CRNA Pre-anesthesia Checklist: Patient identified, Emergency Drugs available, Suction available and Patient being monitored Patient Re-evaluated:Patient Re-evaluated prior to induction Oxygen Delivery Method: Circle System Utilized Preoxygenation: Pre-oxygenation with 100% oxygen Induction Type: IV induction Ventilation: Mask ventilation without difficulty LMA: LMA inserted LMA Size: 4.0 Number of attempts: 1 Airway Equipment and Method: Bite block Placement Confirmation: positive ETCO2 Tube secured with: Tape Dental Injury: Teeth and Oropharynx as per pre-operative assessment  Comments: Teeth very poor condition, unchanged with LMA insertion.

## 2020-09-16 NOTE — Op Note (Signed)
Procedure: 1.  Cystoscopy with left retrograde pyelogram and interpretation. 2.  Left ureteroscopy with holmium laser application, stone extraction and insertion of left double-J stent. 3.  Application of fluoroscopy.  Preop diagnosis: Left proximal ureteral stone.  Postop diagnosis: Same.  Surgeon: Dr. Bjorn Pippin.  Anesthesia: General.  Specimen: Stone fragments.  Drains: 6 French by 24 cm left contour double-J stent with tether.  EBL: None.  Complications: None.  Indications: The patient is a 70 year old male with a history of recurrent urolithiasis who most recently underwent a left percutaneous nephrolithotomy.  He subsequently had a residual fragment proximal 8 mm in size lodged in the left proximal ureter and is causing some obstruction and pain.  He is elected ureteroscopy for management.  Procedure: He was taken operating room where he was given antibiotics.  A general anesthetic was induced.  He was placed in lithotomy position and fitted with PAS hose.  His perineum and genitalia were prepped with Betadine solution he was draped in usual sterile fashion.  Cystoscopy was performed using a 23 [place] scope and 30 degree lens.  Examination revealed a normal urethra.  The prostatic urethra short with some bilobar hyperplasia but minimal obstruction.  Examination of bladder revealed mild trabeculation.  There were no significant mucosal lesions noted.  Ureteral orifices were in the normal anatomic position.  The left ureteral orifice was cannulated with 5 French opening catheter and Omnipaque was instilled.  The left retrograde pyelogram demonstrated a normal caliber ureter up to a filling defect just below the UPJ consistent with his 8 mm stone.  There was mild proximal dilation.  No other filling defects were noted in the intrarenal collecting system.  A sensor wire was then advanced to the kidney under fluoroscopic guidance.  A 12 French inner core of a 35 cm digital access  sheath was passed over the wire to the kidney without difficulty.  This was followed by the assembled sheath.  Once the sheath was in position the inner core and wire were removed.  And the dual-lumen digital flexible ureteroscope was advanced to the kidney.  The stone had been knocked back into the midpole calyx and an initial attempt at removal intact was not successful due to the size of the stone.  The stone was then engaged with a 200 m holmium laser fiber with a setting of 0.2 J and 70 Hz and broken into manageable fragments.  Those fragments were then removed using the engage basket.  An additional calculus was removed from the upper pole where it was adherent to a papilla measure approximately 3 mm in size.  There were other Randall's plaques identified in the upper and lower poles but nothing that could be removed.  Ureteroscope was removed and the sensor wire was reinserted to the kidney.  The sheath was removed without difficulty and the cystoscope was then reinserted over the wire.  A 6 French by 24 cm contour double-J stent was advanced the kidney under fluoroscopic guidance.  The wire was removed, leaving a good coil in the kidney and a good coil in the bladder.  The bladder was then drained and the cystoscope was removed leaving the stent string exiting urethra.  The string was secured to the patient's penis with tape.  he was taken down from lithotomy, his anesthetic was reversed and he was moved recovery in stable condition.  There were no complications.  He was given a stone fragments.

## 2020-09-17 ENCOUNTER — Encounter (HOSPITAL_COMMUNITY): Payer: Self-pay | Admitting: Urology

## 2020-09-17 NOTE — Anesthesia Postprocedure Evaluation (Signed)
Anesthesia Post Note  Patient: Anthony [name]  Procedure(s) Performed: CYSTOSCOPY LEFT RETROGRADE PYELOGRAM LEFT URETEROSCOPY/HOLMIUM LASER/STENT PLACEMENT (Left )     Patient location during evaluation: PACU Anesthesia Type: General Level of consciousness: awake and alert Pain management: pain level controlled Vital Signs Assessment: post-procedure vital signs reviewed and stable Respiratory status: spontaneous breathing, nonlabored ventilation, respiratory function stable and patient connected to nasal cannula oxygen Cardiovascular status: blood pressure returned to baseline and stable Postop Assessment: no apparent nausea or vomiting Anesthetic complications: no   No complications documented.  Last Vitals:  Vitals:   09/16/20 0830 09/16/20 0845  BP: (!) 151/92 (!) 144/90  Pulse: 62 63  Resp: 11 (!) 7  Temp:    SpO2: 100% 100%    Last Pain:  Vitals:   09/16/20 0845  TempSrc:   PainSc: 3                  Rhilee Currin L [place]

## 2022-05-17 IMAGING — MR MR THORACIC SPINE W/O CM
4 of 6 series · 17 of 48 positions shown · non-contrast
Comparison: 01/15/2020 cervical spine radiographs and prior.
01/14/2020 CT cervical spine. 08/13/2010 MRI cervical spine.

CLINICAL DATA: Cervical radiculopathy and lower thoracic pain.

EXAM:
MRI CERVICAL AND THORACIC SPINE WITHOUT CONTRAST
TECHNIQUE: Multiplanar and multiecho pulse sequences of the cervical spine, to
include the craniocervical junction and cervicothoracic junction,
and the thoracic spine, were obtained without intravenous contrast.

[Series 3: T2 · sagittal · 4.0mm · 0.55mm/px · 5 of 17 slices shown (1 of 3)]
[im 1/17]
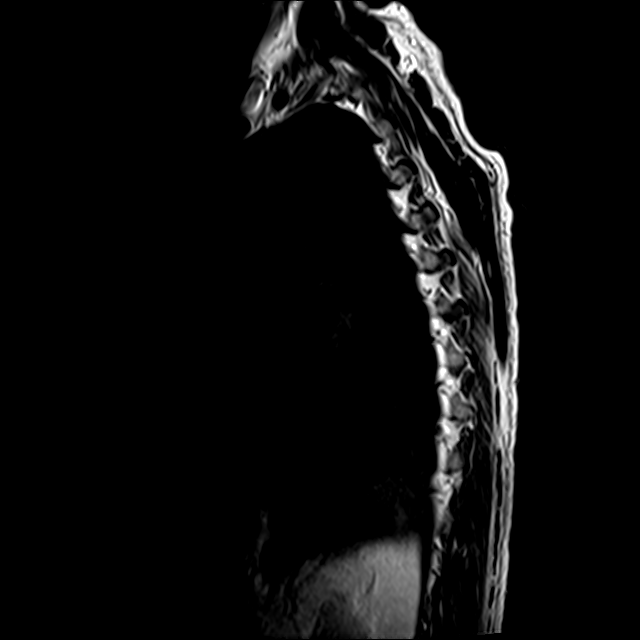
[im 5/17]
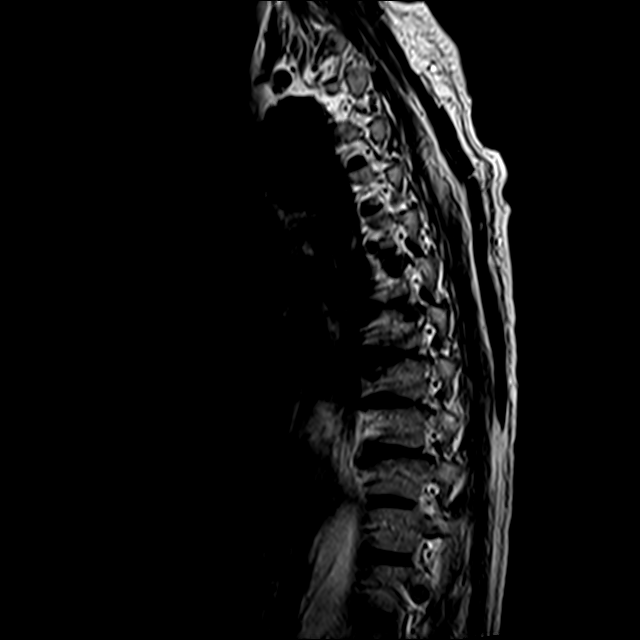
[im 9/17]
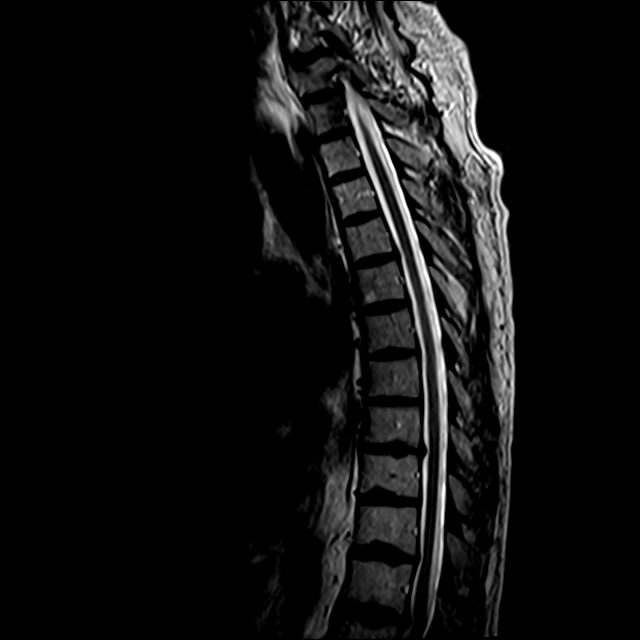
[im 13/17]
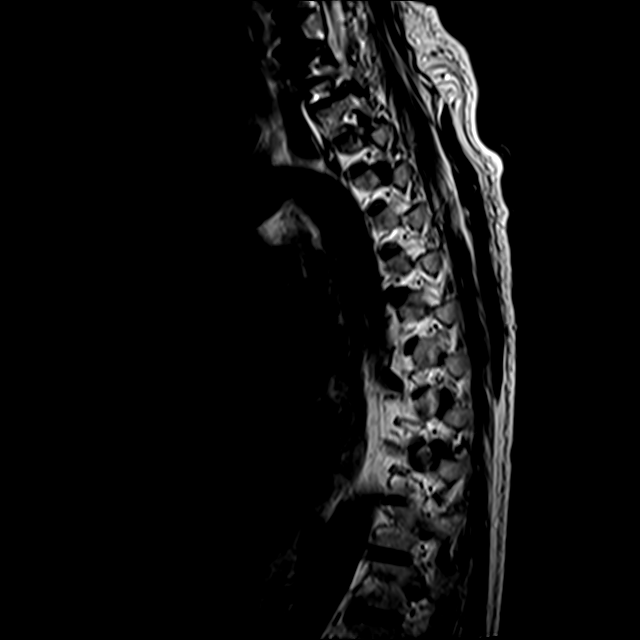
[im 17/17]
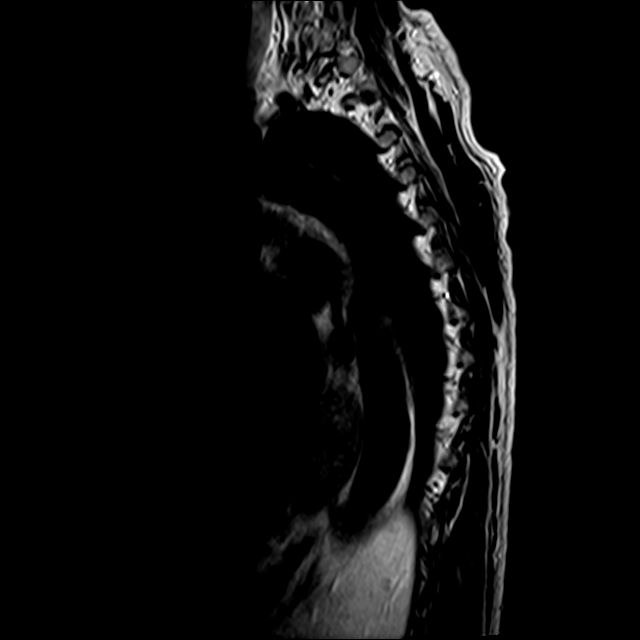

[Series 5: T1 · sagittal · 4.0mm · 1.09mm/px · 3 of 17 slices shown]
[im 4/17]
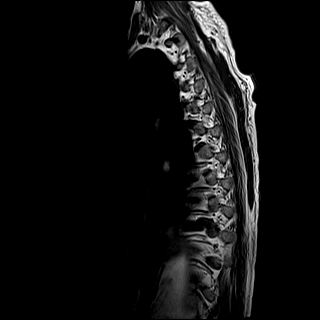
[im 10/17]
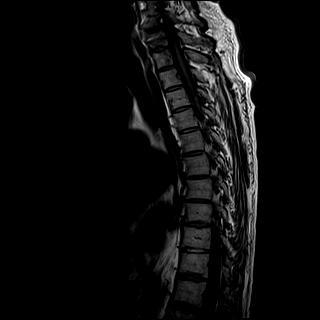
[im 17/17]
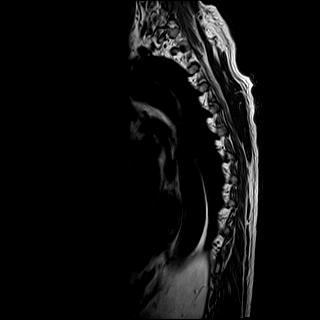

[Series 6: T2 · axial · 4.0mm · 0.39mm/px · z∈[-361,-136]mm · 6 of 39 slices shown (2 of 3)]
[im 1/39]
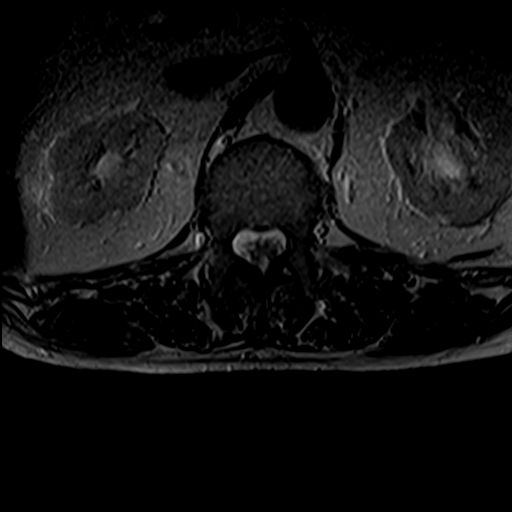
[im 7/39]
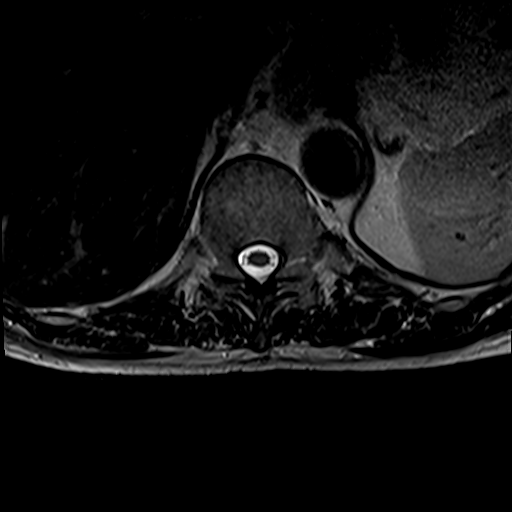
[im 13/39]
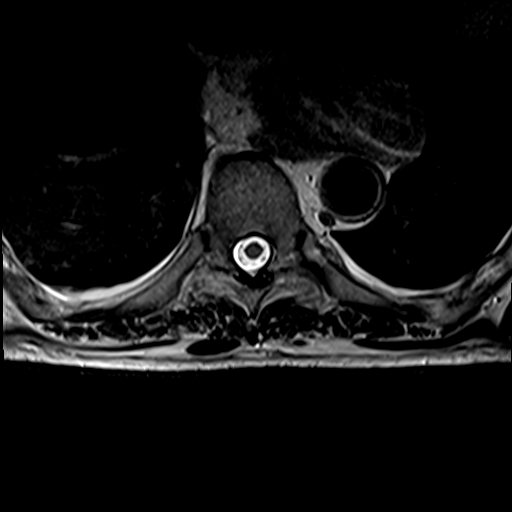
[im 16/39]
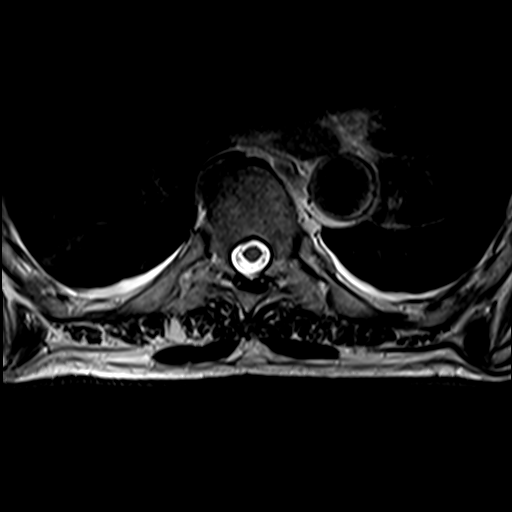
[im 20/39]
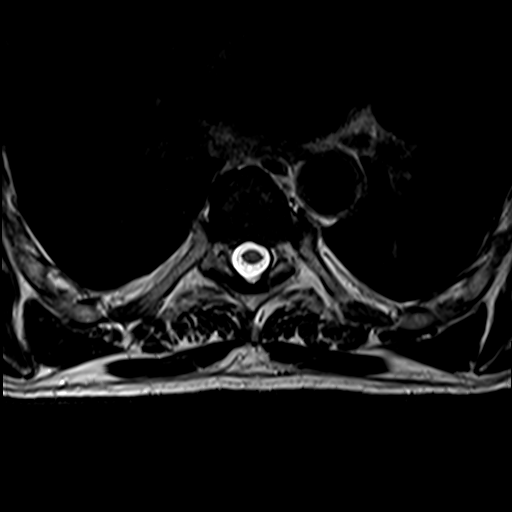
[im 32/39]
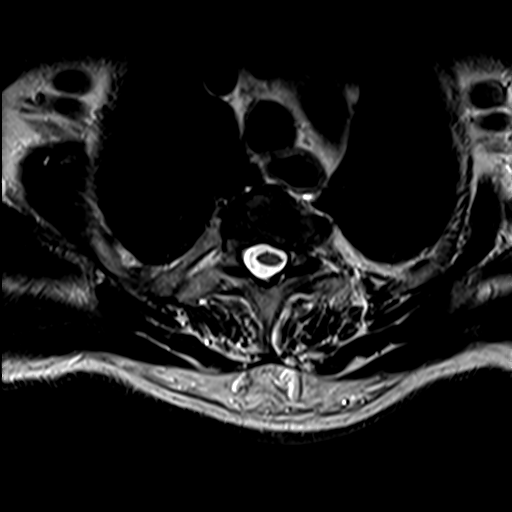

[Series 7: T2 · axial · 4.0mm · 0.39mm/px · z∈[-300,-136]mm · 3 of 39 slices shown (3 of 3)]
[im 7/39]
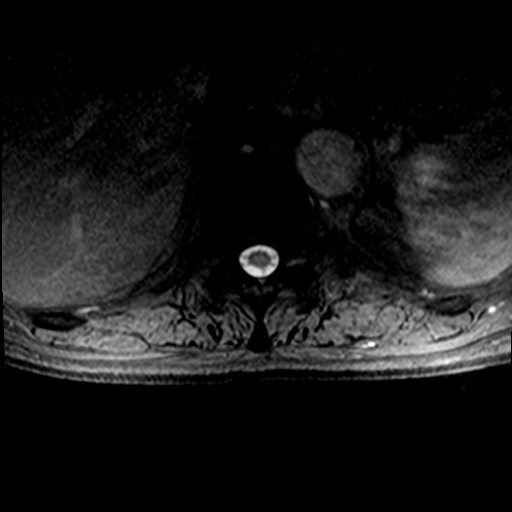
[im 20/39]
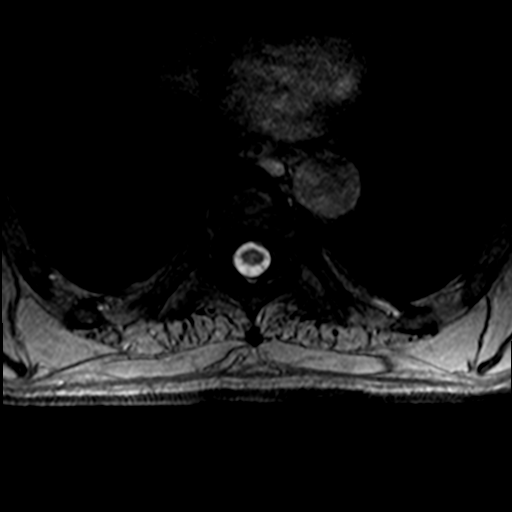
[im 32/39]
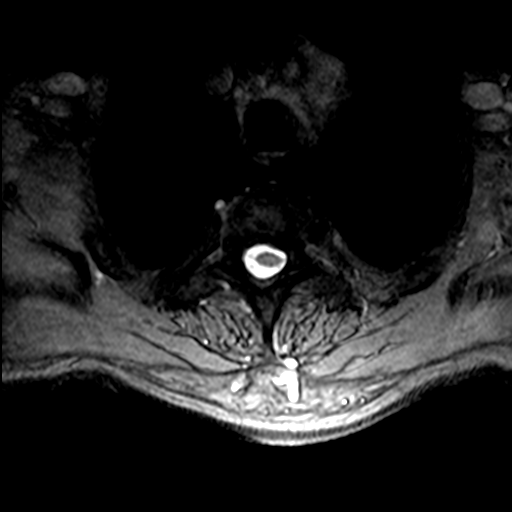

[17 of 48 positions shown; findings below may reference images not displayed]

FINDINGS: MRI CERVICAL SPINE FINDINGS

Alignment: Straightening of lordosis. Sequela of C5-T1 ACDF.
Associated susceptibility artifact limits evaluation.

Vertebrae: Normal bone marrow signal intensity. No focal osseous
lesion.

Cord: Normal signal and morphology.

Posterior Fossa, vertebral arteries: Negative.

Disc levels: Multilevel desiccation and disc space loss.

C2-3: Disc osteophyte complex with superimposed left paracentral
protrusion, uncovertebral and bilateral facet hypertrophy. Mild
spinal canal and bilateral neural foraminal narrowing.

C3-4: Disc osteophyte complex with right predominant uncovertebral
and facet hypertrophy. Moderate spinal canal, severe right greater
than left neural foraminal narrowing.

C4-5: Disc osteophyte complex with superimposed central and right
subarticular/foraminal protrusions. Uncovertebral and bilateral
facet degenerative spurring. Mild spinal canal and left neural
foraminal narrowing. Severe right neural foraminal narrowing.

C5-6: Sequela of fusion.  Patent spinal canal and neural foramen.

C6-7: Sequela of fusion.  Patent spinal canal and neural foramen.

C7-T1: Sequela of fusion.  Patent spinal canal and neural foramen.

Paraspinal tissues: Negative.

MRI THORACIC SPINE FINDINGS

Alignment:  Normal.

Vertebrae: Normal bone marrow signal intensity.  T6 hemangioma.

Cord:  Normal signal and morphology.

Paraspinal and other soft tissues: Large hiatal hernia.

Disc levels:

Multilevel endplate degenerative changes and desiccation. Multilevel
facet degenerative spurring.

T7-8 right paracentral protrusion abutting the ventral cord with
partial effacement of the ventral CSF containing spaces. Patent
spinal canal and neural foramen.

T9-10 disc bulge partially effacing the ventral CSF containing
spaces with mild spinal canal narrowing. Patent neural foramen.

T12-L1 inferiorly migrated left paracentral extrusion abutting the
ventral cord. Patent spinal canal and neural foramen.

Additional small posterior protrusions at the T2-3, T5-6, T8-9 level
without significant spinal canal or neural foraminal narrowing.
IMPRESSION: MRI CERVICAL SPINE:

Multilevel spondylosis, slightly progressed since prior exam.

Moderate C3-4 spinal canal narrowing. Severe bilateral C3-4 and
right C4-5 neural foraminal narrowing.

Sequela of C5-T1 ACDF. Patent spinal canal and neural foramen at
these levels.

MRI THORACIC SPINE:

Inferiorly migrated left T12-L1 paracentral extrusion abutting the
ventral cord.

T9-10 disc bulge with mild spinal canal narrowing. Patent neural
foramen.

T7-8 right paracentral protrusion abutting the ventral cord.

Large hiatal hernia.

## 2023-03-28 NOTE — Progress Notes (Unsigned)
CARDIOLOGY CONSULT NOTE       Patient ID: Anthony [name] MRN: [id number] DOB/AGE: [birth year] 72 y.o.  Admit date: (Not on file) Referring Physician: Dannielle Huh NP Primary Physician: [name], MD Primary Cardiologist: New Reason for Consultation: Dizziness  Active Problems:   * No active hospital problems. *   HPI:  72 y.o. referred by [facility] for dizziness. Has HTN and been prescribed escalating doses of Lisinopril. With this has been having lightheadedness. No postural symptoms. No chest pain, palpitations, syncope or dyspnea. No vertigo like associations. 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   Pneumonia    Renal calculi    left   Renal insufficiency     Family History  Problem Relation Age of Onset   Colon cancer Mother    Stroke Mother    Cancer Mother    Diabetes Mother     Hypertension Mother    Hypertension Brother    Colon cancer Brother        Pre-cancerous Polyps removed   Kidney disease Brother        Chronic- secondary to Hypertension   Stroke Father    Thyroid disease Daughter    Colon cancer Maternal Aunt        x 2   Colon cancer Maternal Uncle     Social History   Socioeconomic History   Marital status: Unknown    Spouse name: Not on file   Number of children: Not on file   Years of education: Not on file   Highest education level: Not on file  Occupational History   Not on file  Tobacco Use   Smoking status: Former    Current packs/day: 0.00    Types: Cigarettes, Pipe    Quit date: 2001    Years since quitting: 23.6   Smokeless tobacco: Never  Vaping Use   Vaping status: Never Used  Substance and Sexual Activity   Alcohol use: Yes    Alcohol/week: 1.0 standard drink of alcohol    Types: 1 Shots of liquor per week    Comment: occasionally    Drug use: No   Sexual activity: Not on file  Other Topics Concern   Not on file  Social History Narrative   **  Merged History Encounter **       Social Determinants of Health   Financial Resource Strain: Not on file  Food Insecurity: Not on file  Transportation Needs: Not on file  Physical Activity: Not on file  Stress: Not on file  Social Connections: Not on file  Intimate Partner Violence: Not on file    Past Surgical History:  Procedure Laterality Date   ANTERIOR CERVICAL DECOMP/DISCECTOMY FUSION  C5--C7  06-05-2002//  C7--T1   03-29-2006   BICEPS TENDON REPAIR Right 2004 approx   CHOLECYSTECTOMY     COLONOSCOPY WITH PROPOFOL N/A 06/27/2015   Procedure: COLONOSCOPY WITH PROPOFOL;  Surgeon: Midge Minium, MD;  Location: [facility];  Service: Endoscopy;  Laterality: N/A;   CYSTO/  RIGHT RETROGRADE PYELOGRAM/ URETEROSCOPY ATTEMPTED STONE MANIPULATION/  RIGHT STENT PLACEMENT  03-03-2009   CYSTOSCOPY WITH RETROGRADE PYELOGRAM, URETEROSCOPY AND STENT PLACEMENT Right  01/24/2014   Procedure: CYSTOSCOPY WITH RETROGRADE PYELOGRAM, URETEROSCOPY AND STENT PLACEMENT, holmium laser, stone extraction;  Surgeon: Danae Chen, MD;  Location: [facility];  Service: Urology;  Laterality: Right;   CYSTOSCOPY WITH RETROGRADE PYELOGRAM, URETEROSCOPY AND STENT PLACEMENT Right 02/07/2014   Procedure: RIGHT URETEROSCOPY, STONE EXTRACTION AND POSSIBLE STENT PLACEMENT;  Surgeon: Anner Crete, MD;  Location: [facility];  Service: Urology;  Laterality: Right;   CYSTOSCOPY WITH RETROGRADE PYELOGRAM, URETEROSCOPY AND STENT PLACEMENT Bilateral 02/04/2015   Procedure: CYSTOSCOPY WITH RETROGRADE PYELOGRAM, URETEROSCOPY AND STENT PLACEMENT, BILATERAL;  Surgeon: Jethro Bolus, MD;  Location: [facility];  Service: Urology;  Laterality: Bilateral;   CYSTOSCOPY WITH URETEROSCOPY, STONE BASKETRY AND STENT PLACEMENT Bilateral 02/20/2015   Procedure: CYSTOSCOPY WITH BILATERAL URETEROSCOPY, STONE EXTRACTION WITH LASER  AND STENT PLACEMENT;  Surgeon: Bjorn Pippin, MD;  Location: [facility];  Service: Urology;  Laterality: Bilateral;   CYSTOSCOPY/URETEROSCOPY/HOLMIUM LASER/STENT PLACEMENT Left 09/16/2020   Procedure: CYSTOSCOPY LEFT RETROGRADE PYELOGRAM LEFT URETEROSCOPY/HOLMIUM LASER/STENT PLACEMENT;  Surgeon: Bjorn Pippin, MD;  Location: [facility];  Service: Urology;  Laterality: Left;   ESOPHAGOGASTRODUODENOSCOPY (EGD) WITH PROPOFOL N/A 10/17/2015   Procedure: ESOPHAGOGASTRODUODENOSCOPY (EGD) WITH PROPOFOL;  Surgeon: Midge Minium, MD;  Location: [facility];  Service: Endoscopy;  Laterality: N/A;   EXTRACORPOREAL SHOCK WAVE LITHOTRIPSY  Left 02-13-2015//  Right 01-17-2014   HOLMIUM LASER APPLICATION Right 02/07/2014   Procedure: HOLMIUM LASER APPLICATION;  Surgeon: Anner Crete, MD;  Location: [facility];  Service: Urology;  Laterality: Right;   HOLMIUM LASER APPLICATION N/A 02/20/2015   Procedure: HOLMIUM LASER APPLICATION;  Surgeon: Bjorn Pippin, MD;  Location: [facility];  Service: Urology;  Laterality: N/A;   IR URETERAL STENT LEFT NEW ACCESS W/O SEP NEPHROSTOMY CATH  05/08/2020   IR URETERAL STENT RIGHT NEW ACCESS W/O SEP NEPHROSTOMY CATH  06/23/2020   LAPAROSCOPIC CHOLECYSTECTOMY SINGLE PORT N/A 04/19/2015   Procedure: LAPAROSCOPIC CHOLECYSTECTOMY;  Surgeon: Lattie Haw, MD;  Location: [facility];  Service: General;  Laterality: N/A;   LEFT URETEROSCOPIC LASER LITHOTRIPSY STONE EXTRACTION W/ STENT PLACEMENT  03-09-2011;   09-18-2009   LEFT URETEROSCOPIC STONE EXTRACTION W/ STENT  09-25-2009   LUMBAR LAMINECTOMY  x2  ----  L3 - L5//  L5 -- S1   NEPHROLITHOTOMY Left 05/08/2020   Procedure: LEFT NEPHROLITHOTOMY PERCUTANEOUS;  Surgeon: Bjorn Pippin, MD;  Location: [facility];  Service: Urology;  Laterality: Left;   NEPHROLITHOTOMY Right 06/24/2020   Procedure: RIGHT NEPHROLITHOTOMY PERCUTANEOUS;  Surgeon: Bjorn Pippin, MD;  Location: [facility];  Service: Urology;  Laterality:  Right;   POSTERIOR FUSION LUMBAR SPINE  09-08-2010   Re-do Laminectomy L3-S1 w/  Decompression and fusion    RIGHT URETEROSCOPIC STONE EXTRACTION  03-13-2009   TRANSTHORACIC ECHOCARDIOGRAM  07-04-2007   Mild focal basal septal hypertrophy/  ef 60%/  mild MVP involving anterior and posterior leaflets w/ trivial MR/  trivial pericardial effusion posterior to the heart      Current Outpatient Medications:    acetaminophen (TYLENOL) 500 MG tablet, Take 2 tablets (1,000 mg total) by mouth every 8 (eight) hours as needed. (Patient taking differently: Take 1,000 mg by mouth every 8 (eight) hours as needed for mild pain, moderate pain or headache.), Disp: 30 tablet, Rfl: 0   amLODipine (NORVASC) 5 MG tablet, Take 5 mg by mouth daily., Disp: , Rfl:    esomeprazole (NEXIUM) 20 MG capsule, Take 20 mg by mouth daily., Disp: , Rfl:    ibuprofen (ADVIL) 200 MG tablet, Take 400 mg by mouth every 6 (six) hours as needed for mild pain or moderate pain., Disp: , Rfl:     lidocaine (LIDODERM) 5 %, Place 1 patch onto the skin daily as needed (pain). Remove & Discard patch within 12 hours or as directed by MD, Disp: , Rfl:    lisinopril (ZESTRIL) 40 MG tablet, Take 40 mg by mouth daily., Disp: , Rfl:     Physical Exam: Blood pressure 120/70, pulse 68, height 5' 10.5" (1.791 m), weight 165 lb 6.4 oz (75 kg), SpO2 99%.   Affect appropriate Healthy:  appears stated age HEENT: normal Neck supple with no adenopathy JVP normal no bruits no thyromegaly Lungs clear with no wheezing and good diaphragmatic motion Heart:  S1/S2 no murmur, no rub, gallop or click PMI normal Abdomen: benighn, BS positve, no tenderness, no AAA no bruit.  No HSM or HJR Distal pulses intact with no bruits No edema Neuro non-focal Skin warm and dry No muscular weakness y   Labs:   Lab Results  Component Value Date   WBC 4.6 09/03/2020   HGB 14.6 09/03/2020   HCT 46.4 09/03/2020   MCV 87.7 09/03/2020   PLT 168 09/03/2020   No results for input(s): "NA", "K", "CL", "CO2", "BUN", "CREATININE", "CALCIUM", "PROT", "BILITOT", "ALKPHOS", "ALT", "AST", "GLUCOSE" in the last 168 hours.  Invalid input(s): "LABALBU" Lab Results  Component Value Date   CKTOTAL 441 (H) 01/14/2020   CKMB 1.9 07/02/2007   TROPONINI <0.03 09/18/2015    Lab Results  Component Value Date   CHOL  07/03/2007    116        ATP III CLASSIFICATION:  <200     mg/dL   Desirable  161-096  mg/dL   Borderline High  >=045    mg/dL   High   Lab Results  Component Value Date   HDL 24 (L) 07/03/2007   Lab Results  Component Value Date   LDLCALC  07/03/2007    78        Total Cholesterol/HDL:CHD Risk Coronary Heart Disease Risk Table                     Men   Women  1/2 Average Risk   3.4   3.3   Lab Results  Component Value Date   TRIG 72 07/03/2007   Lab Results  Component Value Date   CHOLHDL 4.8 07/03/2007   No results found for: "LDLDIRECT"    Radiology: No results found.  EKG: SR  nonspecific ST changes  ASSESSMENT AND PLAN:   Dizziness:  not cardiac related no need for monitor ECG ok  MVP: history of - not seen on TTE 2021 update TTE Lumbago:  continue robaxin and lidoderm patch BPH:  continue flomax  Chest pain: does describe SSCP with exertion Ex Myovue  Ex Myovue TTE  F/U in a year pending test results   Signed: Charlton Haws 03/31/2023, 2:34 PM

## 2023-03-31 ENCOUNTER — Encounter: Payer: Self-pay | Admitting: Cardiovascular Disease

## 2023-03-31 ENCOUNTER — Ambulatory Visit: Payer: Medicare Other | Attending: Cardiovascular Disease | Admitting: Cardiovascular Disease

## 2023-03-31 VITALS — BP 120/70 | HR 68 | Ht 70.5 in | Wt 165.4 lb

## 2023-03-31 DIAGNOSIS — R42 Dizziness and giddiness: Secondary | ICD-10-CM | POA: Diagnosis not present

## 2023-03-31 DIAGNOSIS — I10 Essential (primary) hypertension: Secondary | ICD-10-CM | POA: Diagnosis not present

## 2023-03-31 DIAGNOSIS — R072 Precordial pain: Secondary | ICD-10-CM | POA: Insufficient documentation

## 2023-03-31 DIAGNOSIS — I341 Nonrheumatic mitral (valve) prolapse: Secondary | ICD-10-CM | POA: Diagnosis not present

## 2023-03-31 NOTE — Patient Instructions (Signed)
Medication Instructions:  Your physician recommends that you continue on your current medications as directed. Please refer to the Current Medication list given to you today.  *If you need a refill on your cardiac medications before your next appointment, please call your pharmacy*  Lab Work: If you have labs (blood work) drawn today and your tests are completely normal, you will receive your results only by: [place] (if you have MyChart) OR A paper copy in the mail If you have any lab test that is abnormal or we need to change your treatment, we will call you to review the results.  Testing/Procedures: Your physician has requested that you have an echocardiogram. Echocardiography is a painless test that uses sound waves to create images of your heart. It provides your doctor with information about the size and shape of your heart and how well your heart's chambers and valves are working. This procedure takes approximately one hour. There are no restrictions for this procedure. Please do NOT wear cologne, perfume, aftershave, or lotions (deodorant is allowed). Please arrive 15 minutes prior to your appointment time.  Your physician has requested that you have en exercise stress myoview. For further information please visit [email or web address]. Please follow instruction sheet, as given.   Follow-Up: At [facility], you and your health needs are our priority.  As part of our continuing mission to provide you with exceptional heart care, we have created designated Provider Care Teams.  These Care Teams include your primary Cardiologist (physician) and Advanced Practice Providers (APPs -  Physician Assistants and Nurse Practitioners) who all work together to provide you with the care you need, when you need it.  We recommend signing up for the patient portal called "MyChart".  Sign up information is provided on this After Visit Summary.  MyChart is used to connect with patients for  Virtual Visits (Telemedicine).  Patients are able to view lab/test results, encounter notes, upcoming appointments, etc.  Non-urgent messages can be sent to your provider as well.   To learn more about what you can do with MyChart, go to [email or web address].    Your next appointment:   1 year(s)  Provider:   Jenkins Rouge, MD

## 2023-04-01 ENCOUNTER — Other Ambulatory Visit: Payer: Self-pay

## 2023-04-01 DIAGNOSIS — R079 Chest pain, unspecified: Secondary | ICD-10-CM

## 2023-04-12 ENCOUNTER — Encounter (HOSPITAL_COMMUNITY): Payer: Self-pay

## 2023-04-22 ENCOUNTER — Encounter (HOSPITAL_COMMUNITY): Payer: Medicare Other

## 2023-04-22 ENCOUNTER — Ambulatory Visit (HOSPITAL_BASED_OUTPATIENT_CLINIC_OR_DEPARTMENT_OTHER): Payer: Medicare Other

## 2023-04-22 ENCOUNTER — Ambulatory Visit (HOSPITAL_COMMUNITY): Payer: Medicare Other | Attending: Cardiovascular Disease

## 2023-04-22 DIAGNOSIS — I341 Nonrheumatic mitral (valve) prolapse: Secondary | ICD-10-CM

## 2023-04-22 DIAGNOSIS — R42 Dizziness and giddiness: Secondary | ICD-10-CM | POA: Diagnosis not present

## 2023-04-22 DIAGNOSIS — R072 Precordial pain: Secondary | ICD-10-CM | POA: Diagnosis not present

## 2023-04-22 LAB — MYOCARDIAL PERFUSION IMAGING
Estimated workload: 7.2
Exercise duration (min): 6 min
Exercise duration (sec): 30 s
LV dias vol: 84 mL (ref 62–150)
LV sys vol: 30 mL
MPHR: 150 {beats}/min
Nuc Stress EF: 64 %
Peak HR: 136 {beats}/min
Percent HR: 90 %
Rest HR: 66 {beats}/min
Rest Nuclear Isotope Dose: 10.9 mCi
SDS: 0
SRS: 0
SSS: 0
Stress Nuclear Isotope Dose: 32 mCi
TID: 0.8

## 2023-04-22 LAB — ECHOCARDIOGRAM COMPLETE
Area-P 1/2: 3.85 cm2
Height: 71 in
S' Lateral: 2.9 cm
Weight: 2640 oz

## 2023-04-22 MED ORDER — TECHNETIUM TC 99M TETROFOSMIN IV KIT
10.9000 | PACK | Freq: Once | INTRAVENOUS | Status: AC | PRN
  Administered 2023-04-22: 10.9 via INTRAVENOUS

## 2023-04-22 MED ORDER — TECHNETIUM TC 99M TETROFOSMIN IV KIT
32.0000 | PACK | Freq: Once | INTRAVENOUS | Status: AC | PRN
  Administered 2023-04-22: 32 via INTRAVENOUS

## 2023-08-09 ENCOUNTER — Other Ambulatory Visit: Payer: Self-pay

## 2023-08-09 ENCOUNTER — Ambulatory Visit: Payer: Medicare Other | Attending: Neurosurgery | Admitting: Physical Therapy

## 2023-08-09 ENCOUNTER — Encounter: Payer: Self-pay | Admitting: Physical Therapy

## 2023-08-09 DIAGNOSIS — M5459 Other low back pain: Secondary | ICD-10-CM | POA: Diagnosis present

## 2023-08-09 DIAGNOSIS — M5416 Radiculopathy, lumbar region: Secondary | ICD-10-CM | POA: Insufficient documentation
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
## 2023-08-29 ENCOUNTER — Ambulatory Visit: Payer: Medicare Other | Attending: Neurosurgery | Admitting: Physical Therapy

## 2023-08-29 ENCOUNTER — Encounter: Payer: Self-pay | Admitting: Physical Therapy

## 2023-08-29 DIAGNOSIS — M5416 Radiculopathy, lumbar region: Secondary | ICD-10-CM | POA: Diagnosis present

## 2023-08-29 DIAGNOSIS — M5459 Other low back pain: Secondary | ICD-10-CM | POA: Insufficient documentation

## 2023-08-29 NOTE — Patient Instructions (Signed)
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
## 2023-08-31 ENCOUNTER — Ambulatory Visit: Payer: Medicare Other | Admitting: Physical Therapy

## 2023-08-31 DIAGNOSIS — M5416 Radiculopathy, lumbar region: Secondary | ICD-10-CM

## 2023-08-31 DIAGNOSIS — M5459 Other low back pain: Secondary | ICD-10-CM
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
## 2023-09-05 ENCOUNTER — Ambulatory Visit: Payer: Medicare Other

## 2023-09-05 DIAGNOSIS — M5459 Other low back pain: Secondary | ICD-10-CM

## 2023-09-05 DIAGNOSIS — M5416 Radiculopathy, lumbar region: Secondary | ICD-10-CM

## 2023-09-07 ENCOUNTER — Ambulatory Visit: Payer: Medicare Other | Admitting: Physical Therapy

## 2023-09-07 DIAGNOSIS — M5416 Radiculopathy, lumbar region: Secondary | ICD-10-CM

## 2023-09-07 DIAGNOSIS — M5459 Other low back pain: Secondary | ICD-10-CM
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
## 2023-09-12 ENCOUNTER — Encounter: Payer: Self-pay | Admitting: Physical Therapy

## 2023-09-12 ENCOUNTER — Ambulatory Visit: Payer: Medicare Other | Admitting: Physical Therapy

## 2023-09-12 DIAGNOSIS — M5459 Other low back pain: Secondary | ICD-10-CM

## 2023-09-12 DIAGNOSIS — M5416 Radiculopathy, lumbar region: Secondary | ICD-10-CM
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
## 2023-09-12 NOTE — Patient Instructions (Signed)

## 2023-09-14 ENCOUNTER — Encounter: Payer: Self-pay | Admitting: Physical Therapy

## 2023-09-14 ENCOUNTER — Ambulatory Visit: Payer: Medicare Other | Admitting: Physical Therapy

## 2023-09-14 DIAGNOSIS — M5459 Other low back pain: Secondary | ICD-10-CM

## 2023-09-14 DIAGNOSIS — M5416 Radiculopathy, lumbar region: Secondary | ICD-10-CM
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
## 2023-09-19 ENCOUNTER — Ambulatory Visit: Payer: Medicare Other

## 2023-09-19 DIAGNOSIS — M5416 Radiculopathy, lumbar region: Secondary | ICD-10-CM

## 2023-09-19 DIAGNOSIS — M5459 Other low back pain: Secondary | ICD-10-CM | POA: Diagnosis not present
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
## 2023-09-21 ENCOUNTER — Ambulatory Visit: Payer: Medicare Other | Admitting: Physical Therapy

## 2023-09-21 ENCOUNTER — Encounter: Payer: Self-pay | Admitting: Physical Therapy

## 2023-09-21 DIAGNOSIS — M5459 Other low back pain: Secondary | ICD-10-CM | POA: Diagnosis not present

## 2023-09-21 DIAGNOSIS — M5416 Radiculopathy, lumbar region: Secondary | ICD-10-CM
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
## 2024-03-16 ENCOUNTER — Telehealth: Payer: Self-pay

## 2024-03-16 NOTE — Telephone Encounter (Signed)
   Pre-operative Risk Assessment    Patient Name: Anthony [name]  DOB: June 30, 1951 MRN: [id number]   Date of last office visit: Dr. Maude Emmer, MD 03/31/2023 Date of next office visit: NONE   Request for Surgical Clearance    Procedure:  Hernia Surgery  Date of Surgery:  Clearance TBD                                Surgeon: Dr. Cordella Idler, MD Surgeon's Group or Practice Name: [facility] Phone number:  [phone] Fax number: [phone]   Type of Clearance Requested:   - Medical    Type of Anesthesia:  General    Additional requests/questions:    SignedAsberry KANDICE Dunning   03/16/2024, 5:27 PM

## 2024-03-20 ENCOUNTER — Telehealth: Payer: Self-pay

## 2024-03-20 NOTE — Telephone Encounter (Signed)
 Preop televisit now scheduled, med rec and consent done.

## 2024-03-20 NOTE — Telephone Encounter (Signed)
  Patient Consent for Virtual Visit        Anthony [name] has provided verbal consent on 03/20/2024 for a virtual visit (video or telephone).   CONSENT FOR VIRTUAL VISIT FOR:  Anthony [name]  By participating in this virtual visit I agree to the following:  I hereby voluntarily request, consent and authorize [facility] and its employed or contracted physicians, physician assistants, nurse practitioners or other licensed health care professionals (the Practitioner), to provide me with telemedicine health care services (the "Services) as deemed necessary by the treating Practitioner. I acknowledge and consent to receive the Services by the Practitioner via telemedicine. I understand that the telemedicine visit will involve communicating with the Practitioner through live audiovisual communication technology and the disclosure of certain medical information by electronic transmission. I acknowledge that I have been given the opportunity to request an in-person assessment or other available alternative prior to the telemedicine visit and am voluntarily participating in the telemedicine visit.  I understand that I have the right to withhold or withdraw my consent to the use of telemedicine in the course of my care at any time, without affecting my right to future care or treatment, and that the Practitioner or I may terminate the telemedicine visit at any time. I understand that I have the right to inspect all information obtained and/or recorded in the course of the telemedicine visit and may receive copies of available information for a reasonable fee.  I understand that some of the potential risks of receiving the Services via telemedicine include:  Delay or interruption in medical evaluation due to technological equipment failure or disruption; Information transmitted may not be sufficient (e.g. poor resolution of images) to allow for appropriate medical decision making by the  Practitioner; and/or  In rare instances, security protocols could fail, causing a breach of personal health information.  Furthermore, I acknowledge that it is my responsibility to provide information about my medical history, conditions and care that is complete and accurate to the best of my ability. I acknowledge that Practitioner's advice, recommendations, and/or decision may be based on factors not within their control, such as incomplete or inaccurate data provided by me or distortions of diagnostic images or specimens that may result from electronic transmissions. I understand that the practice of medicine is not an exact science and that Practitioner makes no warranties or guarantees regarding treatment outcomes. I acknowledge that a copy of this consent can be made available to me via my patient portal [facility] MyChart), or I can request a printed copy by calling the office of [facility].    I understand that my insurance will be billed for this visit.   I have read or had this consent read to me. I understand the contents of this consent, which adequately explains the benefits and risks of the Services being provided via telemedicine.  I have been provided ample opportunity to ask questions regarding this consent and the Services and have had my questions answered to my satisfaction. I give my informed consent for the services to be provided through the use of telemedicine in my medical care

## 2024-03-20 NOTE — Telephone Encounter (Signed)
   Name: Anthony [name]  DOB: May 23, 1951  MRN: [id number]  Primary Cardiologist: Maude Emmer, MD   Preoperative team, please contact this patient and set up a phone call appointment for further preoperative risk assessment. Please obtain consent and complete medication review. Thank you for your help.  I confirm that guidance regarding antiplatelet and oral anticoagulation therapy has been completed and, if necessary, noted below.   None requested.   I also confirmed the patient resides in the state of  . As per [facility] Medical Board telemedicine laws, the patient must reside in the state in which the provider is licensed.    Barnie Hila, NP 03/20/2024, 4:12 PM [facility]

## 2024-04-03 ENCOUNTER — Ambulatory Visit: Attending: Cardiology

## 2024-04-03 DIAGNOSIS — Z01810 Encounter for preprocedural cardiovascular examination: Secondary | ICD-10-CM

## 2024-04-03 NOTE — Progress Notes (Signed)
   Name: Anthony [name]  DOB: [birth year]  MRN: [id number]  Primary Cardiologist: Maude Emmer, MD  Chart reviewed as part of pre-operative protocol coverage. Because of Anthony [name] past medical history and time since last visit, he will require a follow-up in-office visit in order to better assess preoperative cardiovascular risk. Last OV 03/31/23.   Pre-op covering staff: - Please schedule appointment and call patient to inform them. If patient already had an upcoming appointment within acceptable timeframe, please add pre-op clearance to the appointment notes so provider is aware. - Please contact requesting surgeon's office via preferred method (i.e, phone, fax) to inform them of need for appointment prior to surgery.  [name] [name], NP  04/03/2024, 1:10 PM

## 2024-04-07 NOTE — Progress Notes (Unsigned)
 Cardiology Office Note:    Date:  04/09/2024   ID:  Anthony [name], DOB 1950-09-23, MRN [id number]  PCP:  Loring Tanda Mae, MD   [facility] Providers Cardiologist:  Maude Emmer, MD { Click to update primary MD,subspecialty MD or APP then REFRESH:1}    Referring MD: Loring Tanda Mae, *   Chief complaint: Preoperative cardiac risk assessment  History of Present Illness:    Anthony [name] is a 73 y.o. male with a hx of GERD, CKD, mitral valve prolapse, mixed hyperlipidemia, arthritis presents to the office for preoperative cardiac risk assessment for upcoming hernia surgery, date TBD.  Patient was referred to see Dr. Emmer on 03/31/2019 for his dizziness following a diagnosis of hypertension where he was being prescribed escalated doses of lisinopril and Norvasc, he ordered a stress test and echo. 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  Stress test on 04/22/2023 was normal, low risk study, EF 64%.  Patient presents to clinic today by himself.  Does report exertional chest tightness/pressure with heavy activity, primarily over central chest, nonradiating, occasional fluttering in chest, states it happens on average around twice a month.  Reports increased fatigue, has noticed some dark stools, he states he needs to do his Cologuard, states his sister had metastatic colon cancer has not followed up on this.  Denies shortness of breath, nausea, dizziness, near syncope, leg swelling.  States the symptoms states the symptoms are exacerbated with intense activity, relieved with rest, belching improves pain but does not fully resolve, patient has been taking extra Gas-X for symptom management.  Pertinent labs performed with surgeons office on 03/08/2024 show RBC 4.76, hemoglobin 14.3, hematocrit 43.6, platelet 176,  calcium 8.5, total protein 5.8, BUN 11, creatinine 1.41, EGFR 53.   Past Medical History:  Diagnosis Date   Arthritis    hands   Benign prostatic hyperplasia    Bilateral ureteral calculi    Chronic back pain    Chronic kidney disease, stage 3a (HCC)    Complication of anesthesia    self limited tremors after 04/19/15 cholecystectomy   DDD (degenerative disc disease), lumbosacral    GERD (gastroesophageal reflux disease)    GERD without esophagitis    H/O mitral valve prolapse 2008 per echo   asymptomatic and does not see cardiologist.    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   Pneumonia    Renal calculi    left   Renal insufficiency     Past Surgical History:  Procedure Laterality Date   ANTERIOR CERVICAL DECOMP/DISCECTOMY FUSION  C5--C7  06-05-2002//  C7--T1   03-29-2006   BICEPS TENDON REPAIR Right 2004 approx   CHOLECYSTECTOMY     COLONOSCOPY WITH PROPOFOL  N/A 06/27/2015   Procedure: COLONOSCOPY WITH PROPOFOL ;  Surgeon: Rogelia Copping, MD;  Location: [facility];  Service: Endoscopy;  Laterality: N/A;   CYSTO/  RIGHT RETROGRADE PYELOGRAM/ URETEROSCOPY ATTEMPTED STONE MANIPULATION/  RIGHT STENT PLACEMENT  03-03-2009   CYSTOSCOPY WITH RETROGRADE PYELOGRAM, URETEROSCOPY AND STENT PLACEMENT Right 01/24/2014   Procedure: CYSTOSCOPY WITH RETROGRADE PYELOGRAM, URETEROSCOPY AND STENT PLACEMENT, holmium laser, stone extraction;  Surgeon: Oliva VEAR Oiler, MD;  Location: [facility];  Service: Urology;  Laterality: Right;   CYSTOSCOPY WITH RETROGRADE PYELOGRAM, URETEROSCOPY AND STENT PLACEMENT Right 02/07/2014   Procedure: RIGHT URETEROSCOPY, STONE EXTRACTION AND POSSIBLE STENT PLACEMENT;  Surgeon: Norleen JINNY Seltzer, MD;  Location: [facility];  Service: Urology;  Laterality: Right;   CYSTOSCOPY WITH RETROGRADE PYELOGRAM, URETEROSCOPY AND STENT PLACEMENT  Bilateral 02/04/2015   Procedure: CYSTOSCOPY WITH RETROGRADE PYELOGRAM, URETEROSCOPY AND STENT PLACEMENT, BILATERAL;  Surgeon: Arlena Gal, MD;  Location: [facility];  Service: Urology;  Laterality: Bilateral;   CYSTOSCOPY WITH URETEROSCOPY, STONE BASKETRY AND STENT PLACEMENT Bilateral 02/20/2015   Procedure: CYSTOSCOPY WITH BILATERAL URETEROSCOPY, STONE EXTRACTION WITH LASER  AND STENT PLACEMENT;  Surgeon: Norleen Seltzer, MD;  Location[facility];  Service: Urology;  Laterality: Bilateral;   CYSTOSCOPY/URETEROSCOPY/HOLMIUM LASER/STENT PLACEMENT Left 09/16/2020   Procedure: CYSTOSCOPY LEFT RETROGRADE PYELOGRAM LEFT URETEROSCOPY/HOLMIUM LASER/STENT PLACEMENT;  Surgeon: Seltzer Norleen, MD;  Location: [facility];  Service: Urology;  Laterality: Left;   ESOPHAGOGASTRODUODENOSCOPY (EGD) WITH PROPOFOL  N/A 10/17/2015   Procedure: ESOPHAGOGASTRODUODENOSCOPY (EGD) WITH PROPOFOL ;  Surgeon: Rogelia Copping, MD;  Location: [facility];  Service: Endoscopy;  Laterality: N/A;   EXTRACORPOREAL SHOCK WAVE LITHOTRIPSY  Left 02-13-2015//  Right 01-17-2014   HOLMIUM LASER APPLICATION Right 02/07/2014   Procedure: HOLMIUM LASER APPLICATION;  Surgeon: Norleen JINNY Seltzer, MD;  Location: [facility];  Service: Urology;  Laterality: Right;   HOLMIUM LASER APPLICATION N/A 02/20/2015   Procedure: HOLMIUM LASER APPLICATION;  Surgeon: Norleen Seltzer, MD;  Location: [facility];  Service: Urology;  Laterality: N/A;   IR URETERAL STENT LEFT NEW ACCESS W/O SEP NEPHROSTOMY CATH  05/08/2020   IR URETERAL STENT RIGHT NEW ACCESS W/O SEP NEPHROSTOMY CATH  06/23/2020   LAPAROSCOPIC CHOLECYSTECTOMY SINGLE PORT N/A 04/19/2015   Procedure: LAPAROSCOPIC CHOLECYSTECTOMY;  Surgeon: Charlie FORBES Fell, MD;  Location: [facility];  Service: General;  Laterality: N/A;   LEFT URETEROSCOPIC LASER LITHOTRIPSY STONE EXTRACTION W/ STENT PLACEMENT  03-09-2011;   09-18-2009   LEFT URETEROSCOPIC STONE EXTRACTION W/ STENT  09-25-2009    LUMBAR LAMINECTOMY  x2  ----  L3 - L5//  L5 -- S1   NEPHROLITHOTOMY Left 05/08/2020   Procedure: LEFT NEPHROLITHOTOMY PERCUTANEOUS;  Surgeon: Seltzer Norleen, MD;  Location: [facility];  Service: Urology;  Laterality: Left;   NEPHROLITHOTOMY Right 06/24/2020   Procedure: RIGHT NEPHROLITHOTOMY PERCUTANEOUS;  Surgeon: Seltzer Norleen, MD;  Location: [facility];  Service: Urology;  Laterality: Right;   POSTERIOR FUSION LUMBAR SPINE  09-08-2010   Re-do Laminectomy L3-S1 w/  Decompression and fusion    RIGHT URETEROSCOPIC STONE EXTRACTION  03-13-2009   TRANSTHORACIC ECHOCARDIOGRAM  07-04-2007   Mild focal basal septal hypertrophy/  ef 60%/  mild MVP involving anterior and posterior leaflets w/ trivial MR/  trivial pericardial effusion posterior to the heart    Current Medications: Current Meds  Medication Sig   acetaminophen  (TYLENOL ) 500 MG tablet Take 2 tablets (1,000 mg total) by mouth every 8 (eight) hours as needed.   amLODipine (NORVASC) 5 MG tablet Take 5 mg by mouth daily.   esomeprazole (NEXIUM) 20 MG capsule Take 20 mg by mouth daily.   ibuprofen (ADVIL) 200 MG tablet Take 400 mg by mouth every 6 (six) hours as needed for mild pain or moderate pain.   lidocaine  (LIDODERM ) 5 % Place 1 patch onto the skin daily as needed (pain). Remove & Discard patch within 12 hours or as directed by MD   lisinopril (ZESTRIL) 40 MG tablet Take 40 mg by mouth daily.  Allergies:   Cheese, Codeine, Lactose intolerance (gi), Morphine  and codeine, and Morphine  and codeine   Social History   Socioeconomic History   Marital status: Unknown    Spouse name: Not on file   Number of children: Not on file   Years of education: Not on file   Highest education level: Not on file  Occupational History   Not on file  Tobacco Use   Smoking status: Former    Current packs/day: 0.00    Types: Cigarettes, Pipe    Quit date: 2001    Years since quitting: 24.6   Smokeless tobacco: Never  Vaping Use   Vaping status:  Never Used  Substance and Sexual Activity   Alcohol use: Yes    Alcohol/week: 1.0 standard drink of alcohol    Types: 1 Shots of liquor per week    Comment: occasionally    Drug use: No   Sexual activity: Not on file  Other Topics Concern   Not on file  Social History Narrative   ** Merged History Encounter **       Social Drivers of Corporate investment banker Strain: Not on file  Food Insecurity: Not on file  Transportation Needs: Not on file  Physical Activity: Not on file  Stress: Not on file  Social Connections: Not on file     Family History: The patient's family history includes Cancer in his mother; Colon cancer in his brother, maternal aunt, maternal uncle, and mother; Diabetes in his mother; Hypertension in his brother and mother; Kidney disease in his brother; Stroke in his father and mother; Thyroid disease in his daughter.  ROS:   Please see the history of present illness.     All other systems reviewed and are negative.  EKGs/Labs/Other Studies Reviewed:    The following studies were reviewed today:  EKG Interpretation Date/Time:  Monday April 09 2024 14:13:33 EDT Ventricular Rate:  69 PR Interval:  184 QRS Duration:  82 QT Interval:  408 QTC Calculation: 437 R Axis:   65  Text Interpretation: Normal sinus rhythm Normal ECG When compared with ECG of 31-Mar-2023 14:26, No significant change was found Confirmed by Arleen Bar [phone]) on 04/09/2024 2:19:55 PM    Recent Labs: No results found for requested labs within last 365 days.  Recent Lipid Panel    Component Value Date/Time   CHOL  07/03/2007 1615    116        ATP III CLASSIFICATION:  <200     mg/dL   Desirable  799-760  mg/dL   Borderline High  >=759    mg/dL   High   TRIG 72 88/89/7991 1615   HDL 24 (L) 07/03/2007 1615   CHOLHDL 4.8 07/03/2007 1615   VLDL 14 07/03/2007 1615   LDLCALC  07/03/2007 1615    78        Total Cholesterol/HDL:CHD Risk Coronary Heart Disease Risk Table                      Men   Women  1/2 Average Risk   3.4   3.3     Risk Assessment/Calculations:    According to the Revised Cardiac Risk Index (RCRI), his Perioperative Risk of Major Cardiac Event is (%): 0.9  His Functional Capacity in METs is: 7.59 according to the Duke Activity Status Index (DASI).   Physical Exam:    VS:  BP 108/67   Pulse 69   Ht 5'  10 (1.778 m)   Wt 161 lb (73 kg)   SpO2 96%   BMI 23.10 kg/m     Wt Readings from Last 3 Encounters:  04/09/24 161 lb (73 kg)  04/22/23 165 lb (74.8 kg)  03/31/23 165 lb 6.4 oz (75 kg)     GEN: Well nourished, well developed in no acute distress HEENT: Normal NECK: No carotid bruits CARDIAC: S1-S2 normal, RRR, no murmurs, rubs, gallops RESPIRATORY:  Clear to auscultation without rales, wheezing or rhonchi  ABDOMEN: Soft, non-tender, non-distended MUSCULOSKELETAL:  No edema; No deformity  SKIN: Warm and dry NEUROLOGIC:  Alert and oriented x 3 PSYCHIATRIC:  Normal affect   ASSESSMENT:    1. Chest pain, unspecified type   2. Precordial pain   3. Primary hypertension   4. Preoperative clearance   5. Other fatigue   6. Fatigue, unspecified type    PLAN:    In order of problems listed above:  Preoperative cardiac risk assessment -According to the Revised Cardiac Risk Index (RCRI), his Perioperative Risk of Major Cardiac Event is (%): 0.9 -His Functional Capacity in METs is: 7.59 according to the Duke Activity Status Index (DASI). - Patient did report exertional chest pain/tightness/pressure over anterior chest accompanied by a fluttering sensation with intense physical activity.  Also reports a generalized increased fatigue, occurring on average around twice a month. - EKG in office today was relatively unchanged from prior - With history of myxomatous mitral valve with regurgitation, will order repeat echo - Will order repeat Lexiscan with new symptoms of chest pain and palpitations - Patient would be of low  surgical risk for planned procedure if echo remains unchanged from previous results and stress test is normal, low risk.  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  - Will update echo  Hypertension - PCP managing hypertension - Continue Norvasc 5 mg daily - Continue lisinopril 40 mg daily    Follow-up in 1 year or sooner if new symptoms occur      Informed Consent   Shared Decision Making/Informed Consent{ All outpatient stress tests require an informed consent ([id number]) ATTESTATION ORDER       :[id number]} The risks [chest pain, shortness of breath, cardiac arrhythmias, dizziness, blood pressure fluctuations, myocardial infarction, stroke/transient ischemic attack, nausea, vomiting, allergic reaction, radiation exposure, metallic taste sensation and life-threatening complications (estimated to be 1 in 10,000)], benefits (risk stratification, diagnosing coronary artery disease, treatment guidance) and alternatives of a nuclear stress test were discussed in detail with Mr. Tugwell and he agrees to proceed.       Medication Adjustments/Labs and Tests Ordered: Current medicines are reviewed at length with the patient today.  Concerns regarding medicines are outlined above.  Orders Placed This Encounter  Procedures   Cardiac Stress Test: Informed Consent Details: Physician/Practitioner Attestation; Transcribe to consent form and obtain patient signature   MYOCARDIAL PERFUSION IMAGING   EKG 12-Lead   ECHOCARDIOGRAM COMPLETE   No orders of the defined types were placed in this encounter.   Patient Instructions  Medication Instructions:  Your physician recommends that you continue on your current medications as directed. Please refer to the Current Medication list given  to you today.  *If you need a refill on your cardiac medications before your next appointment, please call your pharmacy*  Lab Work:  NONE If you have labs (blood work) drawn today and your tests are completely normal, you will receive your results only by: MyChart Message (if you have MyChart) OR A paper copy in the mail If you have any lab test that is abnormal or we need to change your treatment, we will call you to review the results.  Testing/Procedures: Your physician has requested that you have an echocardiogram. Echocardiography is a painless test that uses sound waves to create images of your heart. It provides your doctor with information about the size and shape of your heart and how well your heart's chambers and valves are working. This procedure takes approximately one hour. There are no restrictions for this procedure. Please do NOT wear cologne, perfume, aftershave, or lotions (deodorant is allowed). Please arrive 15 minutes prior to your appointment time.  Please note: We ask at that you not bring children with you during ultrasound (echo/ vascular) testing. Due to room size and safety concerns, children are not allowed in the ultrasound rooms during exams. Our front office staff cannot provide observation of children in our lobby area while testing is being conducted. An adult accompanying a patient to their appointment will only be allowed in the ultrasound room at the discretion of the ultrasound technician under special circumstances. We apologize for any inconvenience.   Your physician has requested that you have a lexiscan myoview . For further information please visit [email or web address] Please follow instruction sheet, as given.  Follow-Up: At [facility], you and your health needs are our priority.  As part of our continuing mission to provide you with exceptional heart care, our providers are all part of one team.  This team includes your primary Cardiologist  (physician) and Advanced Practice Providers or APPs (Physician Assistants and Nurse Practitioners) who all work together to provide you with the care you need, when you need it.  Your next appointment:   1 year(s)  Provider:   Maude Emmer, MD   We recommend signing up for the patient portal called MyChart.  Sign up information is provided on this After Visit Summary.  MyChart is used to connect with patients for Virtual Visits (Telemedicine).  Patients are able to view lab/test results, encounter notes, upcoming appointments, etc.  Non-urgent messages can be sent to your provider as well.   To learn more about what you can do with MyChart, go to [email or web address].          Signed, Miriam FORBES Shams, NP  04/09/2024 3:10 PM    [facility]

## 2024-04-09 ENCOUNTER — Encounter: Payer: Self-pay | Admitting: General Practice

## 2024-04-09 ENCOUNTER — Ambulatory Visit: Attending: General Practice | Admitting: Emergency Medicine

## 2024-04-09 VITALS — BP 108/67 | HR 69 | Ht 70.0 in | Wt 161.0 lb

## 2024-04-09 DIAGNOSIS — Z01818 Encounter for other preprocedural examination: Secondary | ICD-10-CM | POA: Diagnosis not present

## 2024-04-09 DIAGNOSIS — I10 Essential (primary) hypertension: Secondary | ICD-10-CM

## 2024-04-09 DIAGNOSIS — I341 Nonrheumatic mitral (valve) prolapse: Secondary | ICD-10-CM

## 2024-04-09 DIAGNOSIS — R5383 Other fatigue: Secondary | ICD-10-CM

## 2024-04-09 DIAGNOSIS — R079 Chest pain, unspecified: Secondary | ICD-10-CM | POA: Diagnosis not present

## 2024-04-09 NOTE — Patient Instructions (Signed)
 Medication Instructions:  Your physician recommends that you continue on your current medications as directed. Please refer to the Current Medication list given to you today.  *If you need a refill on your cardiac medications before your next appointment, please call your pharmacy*  Lab Work: NONE If you have labs (blood work) drawn today and your tests are completely normal, you will receive your results only by: MyChart Message (if you have MyChart) OR A paper copy in the mail If you have any lab test that is abnormal or we need to change your treatment, we will call you to review the results.  Testing/Procedures: Your physician has requested that you have an echocardiogram. Echocardiography is a painless test that uses sound waves to create images of your heart. It provides your doctor with information about the size and shape of your heart and how well your heart's chambers and valves are working. This procedure takes approximately one hour. There are no restrictions for this procedure. Please do NOT wear cologne, perfume, aftershave, or lotions (deodorant is allowed). Please arrive 15 minutes prior to your appointment time.  Please note: We ask at that you not bring children with you during ultrasound (echo/ vascular) testing. Due to room size and safety concerns, children are not allowed in the ultrasound rooms during exams. Our front office staff cannot provide observation of children in our lobby area while testing is being conducted. An adult accompanying a patient to their appointment will only be allowed in the ultrasound room at the discretion of the ultrasound technician under special circumstances. We apologize for any inconvenience.   Your physician has requested that you have a lexiscan myoview . For further information please visit [email or web address] Please follow instruction sheet, as given.  Follow-Up: At [facility], you and your health needs are our priority.  As  part of our continuing mission to provide you with exceptional heart care, our providers are all part of one team.  This team includes your primary Cardiologist (physician) and Advanced Practice Providers or APPs (Physician Assistants and Nurse Practitioners) who all work together to provide you with the care you need, when you need it.  Your next appointment:   1 year(s)  Provider:   Maude Emmer, MD   We recommend signing up for the patient portal called MyChart.  Sign up information is provided on this After Visit Summary.  MyChart is used to connect with patients for Virtual Visits (Telemedicine).  Patients are able to view lab/test results, encounter notes, upcoming appointments, etc.  Non-urgent messages can be sent to your provider as well.   To learn more about what you can do with MyChart, go to [email or web address].

## 2024-04-10 ENCOUNTER — Encounter: Payer: Self-pay | Admitting: Emergency Medicine

## 2024-04-16 ENCOUNTER — Ambulatory Visit: Payer: Self-pay | Admitting: Emergency Medicine

## 2024-04-16 ENCOUNTER — Ambulatory Visit (HOSPITAL_COMMUNITY)
Admission: RE | Admit: 2024-04-16 | Discharge: 2024-04-16 | Disposition: A | Discharge status: Home / Self Care | Source: Ambulatory Visit | Attending: Emergency Medicine | Admitting: Emergency Medicine

## 2024-04-16 ENCOUNTER — Encounter (HOSPITAL_COMMUNITY): Payer: Self-pay

## 2024-04-16 DIAGNOSIS — R079 Chest pain, unspecified: Secondary | ICD-10-CM | POA: Diagnosis present

## 2024-04-16 DIAGNOSIS — R5383 Other fatigue: Secondary | ICD-10-CM | POA: Insufficient documentation

## 2024-04-16 LAB — ECHOCARDIOGRAM COMPLETE
AR max vel: 3.28 cm2
AV Area VTI: 2.73 cm2
AV Area mean vel: 3.05 cm2
AV Mean grad: 2 mmHg
AV Peak grad: 3.6 mmHg
Ao pk vel: 0.96 m/s
Area-P 1/2: 3.16 cm2
S' Lateral: 2.7 cm

## 2024-04-18 ENCOUNTER — Telehealth (HOSPITAL_COMMUNITY): Payer: Self-pay | Admitting: *Deleted

## 2024-04-18 NOTE — Telephone Encounter (Signed)
 Spoke with patient as a reminder about his STRESS TEST on 04/20/24 at 7:45.

## 2024-04-19 ENCOUNTER — Other Ambulatory Visit: Payer: Self-pay | Admitting: Emergency Medicine

## 2024-04-19 DIAGNOSIS — R5383 Other fatigue: Secondary | ICD-10-CM

## 2024-04-19 DIAGNOSIS — R079 Chest pain, unspecified: Secondary | ICD-10-CM

## 2024-04-20 ENCOUNTER — Ambulatory Visit (HOSPITAL_COMMUNITY)
Admission: RE | Admit: 2024-04-20 | Discharge: 2024-04-20 | Disposition: A | Discharge status: Home / Self Care | Source: Ambulatory Visit | Attending: Emergency Medicine | Admitting: Emergency Medicine

## 2024-04-20 DIAGNOSIS — R079 Chest pain, unspecified: Secondary | ICD-10-CM | POA: Diagnosis present

## 2024-04-20 DIAGNOSIS — R5383 Other fatigue: Secondary | ICD-10-CM | POA: Insufficient documentation

## 2024-04-20 LAB — MYOCARDIAL PERFUSION IMAGING
Base ST Depression (mm): 0 mm
LV dias vol: 107 mL (ref 62–150)
LV sys vol: 36 mL (ref 4.2–5.8)
Nuc Stress EF: 66 %
Peak HR: 98 {beats}/min
Rest HR: 63 {beats}/min
Rest Nuclear Isotope Dose: 10.5 mCi
SDS: 0
SRS: 5
SSS: 1
ST Depression (mm): 0 mm
Stress Nuclear Isotope Dose: 33 mCi
TID: 0.95

## 2024-04-20 MED ORDER — TECHNETIUM TC 99M TETROFOSMIN IV KIT
33.0000 | PACK | Freq: Once | INTRAVENOUS | Status: AC | PRN
  Administered 2024-04-20: 33 via INTRAVENOUS

## 2024-04-20 MED ORDER — REGADENOSON 0.4 MG/5ML IV SOLN
INTRAVENOUS | Status: AC
  Filled 2024-04-20: qty 5

## 2024-04-20 MED ORDER — TECHNETIUM TC 99M TETROFOSMIN IV KIT
10.5000 | PACK | Freq: Once | INTRAVENOUS | Status: AC | PRN
  Administered 2024-04-20: 10.5 via INTRAVENOUS

## 2024-04-20 MED ORDER — REGADENOSON 0.4 MG/5ML IV SOLN
0.4000 mg | Freq: Once | INTRAVENOUS | Status: AC
  Administered 2024-04-20: 0.4 mg via INTRAVENOUS

## 2024-04-26 NOTE — Telephone Encounter (Signed)
 Stress test was good, low risk, no evidence for ischemia (blockages impairing blood-flow with stress on heart). Echo shows good pumping function of your heart. Mitral valve has some irregularities that make it a little leaky, but still functional. Discussed this with Dr. Nishan, who believes the patient is still okay to proceed with surgery.   The clearance was previously forwarded to the surgeon's office fax.

## 2024-05-03 ENCOUNTER — Ambulatory Visit: Payer: Self-pay | Admitting: Emergency Medicine

## 2024-05-14 ENCOUNTER — Ambulatory Visit: Payer: Self-pay | Admitting: General Surgery

## 2024-05-14 NOTE — Progress Notes (Signed)
 Sent message, via epic in basket, requesting orders in epic from Careers adviser.

## 2024-05-18 NOTE — Progress Notes (Signed)
 COVID Vaccine received:  []  No []  Yes Date of any COVID positive Test in last 90 days:  PCP - Dr. Tanda Eke Cardiologist - Dr. Maude Emmer  Chest x-ray -  EKG -  04/09/24 Epic Stress Test - 04/20/24 Epic ECHO - 04/16/24 Epic Cardiac Cath -   Cardiac clearance-Kenzie Elaine NP- 04/09/24  Bowel Prep - []  No  []   Yes ______  Pacemaker / ICD device []  No []  Yes   Spinal Cord Stimulator:[]  No []  Yes       History of Sleep Apnea? []  No []  Yes   CPAP used?- []  No []  Yes    Does the patient monitor blood sugar?          []  No []  Yes  []  N/A  Patient has: []  NO Hx DM   []  Pre-DM                 []  DM1  []   DM2 Does patient have a [employer] or Dexacom? []  No []  Yes   Fasting Blood Sugar Ranges-  Checks Blood Sugar _____ times a day  GLP1 agonist / usual dose -  GLP1 instructions:  SGLT-2 inhibitors / usual dose -  SGLT-2 instructions:   Blood Thinner / Instructions: Aspirin Instructions:  Comments:   Activity level: Patient is able / unable to climb a flight of stairs without difficulty; []  No CP  []  No SOB, but would have ___   Patient can / can not perform ADLs without assistance.   Anesthesia review:   Patient denies shortness of breath, fever, cough and chest pain at PAT appointment.  Patient verbalized understanding and agreement to the Pre-Surgical Instructions that were given to them at this PAT appointment. Patient was also educated of the need to review these PAT instructions again prior to his/her surgery.I reviewed the appropriate phone numbers to call if they have any and questions or concerns.

## 2024-05-18 NOTE — Patient Instructions (Addendum)
 SURGICAL WAITING ROOM VISITATION  Patients having surgery or a procedure may have no more than 2 support people in the waiting area - these visitors may rotate.    Children under the age of 59 must have an adult with them who is not the patient.  Visitors with respiratory illnesses are discouraged from visiting and should remain at home.  If the patient needs to stay at the hospital during part of their recovery, the visitor guidelines for inpatient rooms apply. Pre-op nurse will coordinate an appropriate time for 1 support person to accompany patient in pre-op.  This support person may not rotate.    Please refer to the [facility] website for the visitor guidelines for Inpatients (after your surgery is over and you are in a regular room).       Your procedure is scheduled on: 05/29/24   Report to [facility] Main Entrance    Report to admitting at 7:45 AM   Call this number if you have problems the morning of surgery [phone]   Do not eat food :After Midnight.   After Midnight you may have the following liquids until 7AM DAY OF SURGERY  Water  Non-Citrus Juices (without pulp, NO RED-Apple, White grape, White cranberry) Black Coffee (NO MILK/CREAM OR CREAMERS, sugar ok)  Clear Tea (NO MILK/CREAM OR CREAMERS, sugar ok) regular and decaf                             Plain Jell-O (NO RED)                                           Fruit ices (not with fruit pulp, NO RED)                                     Popsicles (NO RED)                                                               Sports drinks like Gatorade (NO RED)                 Oral Hygiene is also important to reduce your risk of infection.                                    Remember - BRUSH YOUR TEETH THE MORNING OF SURGERY WITH YOUR REGULAR TOOTHPASTE  DENTURES WILL BE REMOVED PRIOR TO SURGERY PLEASE DO NOT APPLY Poly grip OR ADHESIVES!!!   Stop all vitamins and herbal supplements 7 days before  surgery.   Take these medicines the morning of surgery with A SIP OF WATER :  Esomeprazole(Nexium)             You may not have any metal on your body including hair pins, jewelry, and body piercing             Do not wear make-up, lotions, powders, perfumes/cologne, or deodorant  Men may shave face and neck.   Do not bring valuables to the hospital. [place] IS NOT             RESPONSIBLE   FOR VALUABLES.   Contacts, glasses, dentures or bridgework may not be worn into surgery.  DO NOT BRING YOUR HOME MEDICATIONS TO THE HOSPITAL. PHARMACY WILL DISPENSE MEDICATIONS LISTED ON YOUR MEDICATION LIST TO YOU DURING YOUR ADMISSION IN THE HOSPITAL!    Patients discharged on the day of surgery will not be allowed to drive home.  Someone NEEDS to stay with you for the first 24 hours after anesthesia.   Special Instructions: Bring a copy of your healthcare power of attorney and living will documents the day of surgery if you haven't scanned them before.              Please read over the following fact sheets you were given: IF YOU HAVE QUESTIONS ABOUT YOUR PRE-OP INSTRUCTIONS PLEASE CALL [phone] Anthony [name]   If you received a COVID test during your pre-op visit  it is requested that you wear a mask when out in public, stay away from anyone that may not be feeling well and notify your surgeon if you develop symptoms. If you test positive for Covid or have been in contact with anyone that has tested positive in the last 10 days please notify you surgeon.    [facility] - Preparing for Surgery Before surgery, you can play an important role.  Because skin is not sterile, your skin needs to be as free of germs as possible.  You can reduce the number of germs on your skin by washing with CHG (chlorahexidine gluconate) soap before surgery.  CHG is an antiseptic cleaner which kills germs and bonds with the skin to continue killing germs even after washing. Please DO NOT use if you have an  allergy to CHG or antibacterial soaps.  If your skin becomes reddened/irritated stop using the CHG and inform your nurse when you arrive at Short Stay. Do not shave (including legs and underarms) for at least 48 hours prior to the first CHG shower.  You may shave your face/neck.  Please follow these instructions carefully:  1.  Shower with CHG Soap the night before surgery and the  morning of surgery.  2.  If you choose to wash your hair, wash your hair first as usual with your normal  shampoo.  3.  After you shampoo, rinse your hair and body thoroughly to remove the shampoo.                             4.  Use CHG as you would any other liquid soap.  You can apply chg directly to the skin and wash.  Gently with a scrungie or clean washcloth.  5.  Apply the CHG Soap to your body ONLY FROM THE NECK DOWN.   Do   not use on face/ open                           Wound or open sores. Avoid contact with eyes, ears mouth and   genitals (private parts).                       Wash face,  Genitals (private parts) with your normal soap.  6.  Wash thoroughly, paying special attention to the area where your    surgery  will be performed.  7.  Thoroughly rinse your body with warm water  from the neck down.  8.  DO NOT shower/wash with your normal soap after using and rinsing off the CHG Soap.                9.  Pat yourself dry with a clean towel.            10.  Wear clean pajamas.            11.  Place clean sheets on your bed the night of your first shower and do not  sleep with pets. Day of Surgery : Do not apply any lotions/deodorants the morning of surgery.  Please wear clean clothes to the hospital/surgery center.  FAILURE TO FOLLOW THESE INSTRUCTIONS MAY RESULT IN THE CANCELLATION OF YOUR SURGERY  PATIENT SIGNATURE_________________________________  NURSE SIGNATURE__________________________________  ________________________________________________________________________

## 2024-05-21 ENCOUNTER — Encounter (HOSPITAL_COMMUNITY): Payer: Self-pay

## 2024-05-21 ENCOUNTER — Other Ambulatory Visit: Payer: Self-pay

## 2024-05-21 ENCOUNTER — Encounter (HOSPITAL_COMMUNITY)
Admission: RE | Admit: 2024-05-21 | Discharge: 2024-05-21 | Disposition: A | Source: Ambulatory Visit | Attending: General Surgery

## 2024-05-21 VITALS — BP 135/83 | HR 63 | Temp 97.9°F | Resp 16 | Ht 70.0 in | Wt 160.6 lb

## 2024-05-21 DIAGNOSIS — K4090 Unilateral inguinal hernia, without obstruction or gangrene, not specified as recurrent: Secondary | ICD-10-CM | POA: Insufficient documentation

## 2024-05-21 DIAGNOSIS — Z87891 Personal history of nicotine dependence: Secondary | ICD-10-CM | POA: Insufficient documentation

## 2024-05-21 DIAGNOSIS — I129 Hypertensive chronic kidney disease with stage 1 through stage 4 chronic kidney disease, or unspecified chronic kidney disease: Secondary | ICD-10-CM | POA: Insufficient documentation

## 2024-05-21 DIAGNOSIS — I340 Nonrheumatic mitral (valve) insufficiency: Secondary | ICD-10-CM | POA: Diagnosis not present

## 2024-05-21 DIAGNOSIS — N1831 Chronic kidney disease, stage 3a: Secondary | ICD-10-CM | POA: Diagnosis not present

## 2024-05-21 DIAGNOSIS — I2510 Atherosclerotic heart disease of native coronary artery without angina pectoris: Secondary | ICD-10-CM | POA: Diagnosis not present

## 2024-05-21 DIAGNOSIS — K219 Gastro-esophageal reflux disease without esophagitis: Secondary | ICD-10-CM | POA: Diagnosis not present

## 2024-05-21 DIAGNOSIS — I10 Essential (primary) hypertension: Secondary | ICD-10-CM

## 2024-05-21 DIAGNOSIS — Z01818 Encounter for other preprocedural examination: Secondary | ICD-10-CM

## 2024-05-21 DIAGNOSIS — Z01812 Encounter for preprocedural laboratory examination: Secondary | ICD-10-CM | POA: Insufficient documentation

## 2024-05-21 HISTORY — DX: Essential (primary) hypertension: I10

## 2024-05-21 LAB — BASIC METABOLIC PANEL WITH GFR
Anion gap: 11 (ref 5–15)
BUN: 12 mg/dL (ref 8–23)
CO2: 23 mmol/L (ref 22–32)
Calcium: 9.3 mg/dL (ref 8.9–10.3)
Chloride: 102 mmol/L (ref 98–111)
Creatinine, Ser: 1.38 mg/dL — ABNORMAL HIGH (ref 0.61–1.24)
GFR, Estimated: 54 mL/min — ABNORMAL LOW (ref >60)
Glucose, Bld: 96 mg/dL (ref 70–99)
Potassium: 3.9 mmol/L (ref 3.5–5.1)
Sodium: 137 mmol/L (ref 135–145)

## 2024-05-21 LAB — CBC
HCT: 48.1 % (ref 39.0–52.0)
Hemoglobin: 15.4 g/dL (ref 13.0–17.0)
MCH: 27.9 pg (ref 26.0–34.0)
MCHC: 32 g/dL (ref 30.0–36.0)
MCV: 87.1 fL (ref 80.0–100.0)
Platelets: 192 K/uL (ref 150–400)
RBC: 5.52 MIL/uL (ref 4.22–5.81)
RDW: 13.3 % (ref 11.5–15.5)
WBC: 6.4 K/uL (ref 4.0–10.5)
nRBC: 0 % (ref 0.0–0.2)

## 2024-05-21 NOTE — Progress Notes (Signed)
 PCP - Dr. Tanda Eke Cardiologist - Dr. Maude Emmer    Chest x-ray - N/A EKG -  04/09/24 Epic Stress Test - 04/20/24 Epic ECHO - 04/16/24 Epic Cardiac Cath - N/A   Cardiac clearance-Kenzie Elaine NP- 04/09/24   Bowel Prep - [x]  No  []   Yes ______   Pacemaker / ICD device [x]  No []  Yes   Spinal Cord Stimulator:[x]  No []  Yes       History of Sleep Apnea? [x]  No []  Yes   CPAP used?- [x]  No []  Yes     Does the patient monitor blood sugar?          [x]  No []  Yes  []  N/A   Patient has: [x]  NO Hx DM   []  Pre-DM                 []  DM1  []   DM2 Does patient have a [employer] or Dexacom? [x]  No []  Yes   Fasting Blood Sugar Ranges- N/A Checks Blood Sugar ___N/A__ times a day   GLP1 agonist / usual dose - N/A GLP1 instructions: N/A SGLT-2 inhibitors / usual dose - N/A SGLT-2 instructions: N/A   Blood Thinner / Instructions:N/A Aspirin Instructions:N/A     Activity level: Patient is able to climb a flight of stairs without difficulty; [x]  No CP  [x]  No SOB.    Anesthesia review: History MVP, HTN   Patient denies shortness of breath, fever, cough and chest pain at PAT appointment.   Patient verbalized understanding and agreement to the Pre-Surgical Instructions that were given to them at this PAT appointment. Patient was also educated of the need to review these PAT instructions again prior to his/her surgery.I reviewed the appropriate phone numbers to call if they have any and questions or concerns.

## 2024-05-22 NOTE — Anesthesia Preprocedure Evaluation (Addendum)
 Anesthesia Evaluation  Patient identified by MRN, date of birth, ID band Patient awake    Reviewed: Allergy & Precautions, NPO status , Patient's Chart, lab work & pertinent test results  History of Anesthesia Complications Negative for: history of anesthetic complications  Airway Mallampati: II  TM Distance: >3 FB Neck ROM: Full    Dental  (+) Poor Dentition, Missing, Chipped   Pulmonary neg pulmonary ROS, neg sleep apnea, neg COPD, Patient abstained from smoking.Not current smoker, former smoker   Pulmonary exam normal breath sounds clear to auscultation       Cardiovascular Exercise Tolerance: Good METShypertension, Pt. on medications (-) CAD and (-) Past MI (-) dysrhythmias  Rhythm:Regular Rate:Normal - Systolic murmurs Pt seen by cardiology 04/09/2024 for preoperative evaluation.  Per OV note, -According to the Revised Cardiac Risk Index (RCRI), his Perioperative Risk of Major Cardiac Event is (%): 0.9 -His Functional Capacity in METs is: 7.59 according to the Duke Activity Status Index (DASI). - Patient did report exertional chest pain/tightness/pressure over anterior chest accompanied by a fluttering sensation with intense physical activity.  Also reports a generalized increased fatigue, occurring on average around twice a month. - EKG in office today was relatively unchanged from prior - With history of myxomatous mitral valve with regurgitation, will order repeat echo - Will order repeat Lexiscan  with new symptoms of chest pain and palpitations - Patient would be of low surgical risk for planned procedure if echo remains unchanged from previous results and stress test is normal, low risk.   Echo 04/16/2024 with EF 55-60%, mild mitral valve regurgitation.    Stress test 04/16/2024 low risk.    Neuro/Psych negative neurological ROS  negative psych ROS   GI/Hepatic hiatal hernia,GERD  Medicated,,(+)     (-) substance  abuse    Endo/Other  neg diabetes    Renal/GU CRFRenal disease     Musculoskeletal  (+) Arthritis ,    Abdominal   Peds  Hematology   Anesthesia Other Findings Past Medical History: No date: Arthritis     Comment:  hands No date: Benign prostatic hyperplasia No date: Bilateral ureteral calculi No date: Chronic back pain No date: Chronic kidney disease, stage 3a (HCC) No date: Complication of anesthesia     Comment:  self limited tremors after 04/19/15 cholecystectomy No date: DDD (degenerative disc disease), lumbosacral No date: GERD (gastroesophageal reflux disease) No date: GERD without esophagitis 2008 per echo: H/O mitral valve prolapse     Comment:  asymptomatic and does not see cardiologist.  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 No date: Pneumonia No date: Renal calculi     Comment:  left No date: Renal insufficiency  Reproductive/Obstetrics                              Anesthesia Physical Anesthesia Plan  ASA: 2  Anesthesia Plan: General   Post-op Pain Management: Regional block*, Tylenol  PO (pre-op)* and Gabapentin PO (pre-op)*   Induction: Intravenous  PONV Risk Score and Plan: 3 and Ondansetron , Dexamethasone  and Treatment may vary due to age or medical condition  Airway Management Planned: Oral ETT  Additional Equipment: None  Intra-op Plan:   Post-operative Plan:  Extubation in OR  Informed Consent: I have reviewed the patients History and Physical, chart, labs and discussed the procedure including the risks, benefits and alternatives for the proposed anesthesia with the patient or authorized representative who has indicated his/her understanding and acceptance.     Dental advisory given  Plan Discussed with: CRNA and  Surgeon  Anesthesia Plan Comments: (Discussed risks of anesthesia with patient, including PONV, sore throat, lip/dental/eye damage. Rare risks discussed as well, such as cardiorespiratory and neurological sequelae, and allergic reactions. Discussed the role of CRNA in patient's perioperative care. Patient understands.  I discussed with the patient the r/b/a of RIGHT SIDED Transversus Abdominus Plane (TAP) block, including: - Bleeding - Infection - Damage to surrounding structures such as bowel, blood vessels, nerves - Allergic reaction to local anesthetic - Poor or nonfunctional block  Patient understands and agrees. )         Anesthesia Quick Evaluation

## 2024-05-22 NOTE — Progress Notes (Signed)
 Anesthesia Chart Review   Case: [id number] Date/Time: 05/29/24 0945   Procedure: REPAIR, HERNIA, INGUINAL, ROBOT-ASSISTED, LAPAROSCOPIC, USING MESH (Right) - WITH MESH GEN/TAP BLOCK   Anesthesia type: General   Diagnosis: Right inguinal hernia [K40.90]   Pre-op diagnosis: RIGHT INGUINAL HERNIA   Location[facility] ROOM 02 / [facility]   Surgeons: Polly Cordella LABOR, MD       DISCUSSION:73 y.o. former smoker with h/o GERD, HTN, CKD Stage III, BPH, right inguinal hernia scheduled for above procedure 05/29/2024 with Dr. Cordella Polly.   Pt seen by cardiology 04/09/2024 for preoperative evaluation.  Per OV note, -According to the Revised Cardiac Risk Index (RCRI), his Perioperative Risk of Major Cardiac Event is (%): 0.9 -His Functional Capacity in METs is: 7.59 according to the Duke Activity Status Index (DASI). - Patient did report exertional chest pain/tightness/pressure over anterior chest accompanied by a fluttering sensation with intense physical activity.  Also reports a generalized increased fatigue, occurring on average around twice a month. - EKG in office today was relatively unchanged from prior - With history of myxomatous mitral valve with regurgitation, will order repeat echo - Will order repeat Lexiscan  with new symptoms of chest pain and palpitations - Patient would be of low surgical risk for planned procedure if echo remains unchanged from previous results and stress test is normal, low risk.  Echo 04/16/2024 with EF 55-60%, mild mitral valve regurgitation.   Stress test 04/16/2024 low risk.   VS: BP 135/83   Pulse 63   Temp 36.6 C (Oral)   Resp 16   Ht 5' 10 (1.778 m)   Wt 72.8 kg   SpO2 100%   BMI 23.04 kg/m   PROVIDERS: Loring Tanda Mae, MD is PCP   Cardiologist - Dr. Maude Emmer  LABS: Labs reviewed: Acceptable for surgery. (all labs ordered are listed, but only abnormal results are displayed)  Labs Reviewed  BASIC METABOLIC PANEL WITH GFR - Abnormal;  Notable for the following components:      Result Value   Creatinine, Ser 1.38 (*)    GFR, Estimated 54 (*)    All other components within normal limits  CBC     IMAGES:   EKG:   CV: Myocardial Perfusion 04/20/2024   The study is normal. The study is low risk.   No ST deviation was noted.   LV perfusion is normal. There is no evidence of ischemia. There is no evidence of infarction.   Left ventricular function is normal. Nuclear stress EF: 66%. The left ventricular ejection fraction is hyperdynamic (>65%). End diastolic cavity size is normal. End systolic cavity size is normal.   CT images were obtained for attenuation correction and were examined for the presence of coronary calcium when appropriate.   Coronary calcium was present on the attenuation correction CT images. Minimal coronary calcifications were present. Coronary calcifications were present in the left anterior descending artery and right coronary artery distribution(s).   Prior study available for comparison from 04/22/2023. No changes compared to prior study.  Echo 04/16/2024 1. Left ventricular ejection fraction, by estimation, is 55 to 60%. The  left ventricle has normal function. The left ventricle has no regional  wall motion abnormalities. There is moderate asymmetric left ventricular  hypertrophy of the basal-septal  segment. Left ventricular diastolic parameters are consistent with Grade  II diastolic dysfunction (pseudonormalization). Elevated left atrial  pressure.   2. Right ventricular systolic function is normal. The right ventricular  size is normal. Tricuspid regurgitation signal  is inadequate for assessing  PA pressure.   3. Left atrial size was mildly dilated.   4. The mitral valve is myxomatous. Mild mitral valve regurgitation. No  evidence of mitral stenosis. Bileaflet MV prolapse. Mitral annular  disjunction of posterior leaflet. Mild MR.   5. The aortic valve is tricuspid. Aortic valve  regurgitation is not  visualized. No aortic stenosis is present.   6. The inferior vena cava is normal in size with greater than 50%  respiratory variability, suggesting right atrial pressure of 3 mmHg  Past Medical History:  Diagnosis Date   Arthritis    hands   Benign prostatic hyperplasia    Bilateral ureteral calculi    Chronic back pain    Chronic kidney disease, stage 3a (HCC)    Complication of anesthesia    self limited tremors after 04/19/15 cholecystectomy   DDD (degenerative disc disease), lumbosacral    GERD (gastroesophageal reflux disease)    GERD without esophagitis    H/O mitral valve prolapse 2008 per echo   asymptomatic and does not see cardiologist.    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   Pneumonia    Renal calculi    left   Renal insufficiency     Past Surgical History:  Procedure Laterality Date   ANTERIOR CERVICAL DECOMP/DISCECTOMY FUSION  C5--C7  06-05-2002//  C7--T1   03-29-2006   BICEPS TENDON REPAIR Right 2004 approx   CHOLECYSTECTOMY     COLONOSCOPY WITH PROPOFOL  N/A 06/27/2015   Procedure: COLONOSCOPY WITH PROPOFOL ;  Surgeon: Rogelia Copping, MD;  Location: [facility];  Service: Endoscopy;  Laterality: N/A;   CYSTO/  RIGHT RETROGRADE PYELOGRAM/ URETEROSCOPY ATTEMPTED STONE MANIPULATION/  RIGHT STENT PLACEMENT  03/03/2009   CYSTOSCOPY WITH RETROGRADE PYELOGRAM, URETEROSCOPY AND STENT PLACEMENT Right 01/24/2014   Procedure: CYSTOSCOPY WITH RETROGRADE PYELOGRAM, URETEROSCOPY AND STENT PLACEMENT, holmium laser, stone extraction;  Surgeon: Oliva VEAR Oiler, MD;  Location: [facility];  Service: Urology;  Laterality: Right;   CYSTOSCOPY WITH RETROGRADE PYELOGRAM, URETEROSCOPY AND STENT PLACEMENT Right 02/07/2014   Procedure: RIGHT URETEROSCOPY, STONE EXTRACTION AND POSSIBLE STENT PLACEMENT;  Surgeon: Norleen JINNY Seltzer,  MD;  Location: [facility];  Service: Urology;  Laterality: Right;   CYSTOSCOPY WITH RETROGRADE PYELOGRAM, URETEROSCOPY AND STENT PLACEMENT Bilateral 02/04/2015   Procedure: CYSTOSCOPY WITH RETROGRADE PYELOGRAM, URETEROSCOPY AND STENT PLACEMENT, BILATERAL;  Surgeon: Arlena Gal, MD;  Location[facility];  Service: Urology;  Laterality: Bilateral;   CYSTOSCOPY WITH URETEROSCOPY, STONE BASKETRY AND STENT PLACEMENT Bilateral 02/20/2015   Procedure: CYSTOSCOPY WITH BILATERAL URETEROSCOPY, STONE EXTRACTION WITH LASER  AND STENT PLACEMENT;  Surgeon: Norleen Seltzer, MD;  Location: [facility];  Service: Urology;  Laterality: Bilateral;   CYSTOSCOPY/URETEROSCOPY/HOLMIUM LASER/STENT PLACEMENT Left 09/16/2020   Procedure: CYSTOSCOPY LEFT RETROGRADE PYELOGRAM LEFT URETEROSCOPY/HOLMIUM LASER/STENT PLACEMENT;  Surgeon: Seltzer Norleen, MD;  Location[facility];  Service: Urology;  Laterality: Left;   ESOPHAGOGASTRODUODENOSCOPY (EGD) WITH PROPOFOL  N/A 10/17/2015   Procedure: ESOPHAGOGASTRODUODENOSCOPY (EGD) WITH PROPOFOL ;  Surgeon: Rogelia Copping, MD;  Location: [facility];  Service: Endoscopy;  Laterality: N/A;   EXTRACORPOREAL SHOCK WAVE LITHOTRIPSY  Left 02-13-2015//  Right 01-17-2014   HOLMIUM LASER APPLICATION Right 02/07/2014   Procedure: HOLMIUM LASER APPLICATION;  Surgeon: Norleen JINNY Seltzer, MD;  Location: [facility];  Service: Urology;  Laterality: Right;   HOLMIUM LASER APPLICATION N/A 02/20/2015   Procedure: HOLMIUM LASER APPLICATION;  Surgeon: Norleen Seltzer, MD;  Location: [facility];  Service: Urology;  Laterality: N/A;   IR URETERAL STENT LEFT NEW ACCESS W/O SEP NEPHROSTOMY CATH  05/08/2020   IR URETERAL STENT RIGHT NEW ACCESS W/O SEP NEPHROSTOMY CATH  06/23/2020   LAPAROSCOPIC CHOLECYSTECTOMY SINGLE PORT N/A 04/19/2015   Procedure: LAPAROSCOPIC CHOLECYSTECTOMY;  Surgeon: Charlie FORBES Fell, MD;  Location: [facility];  Service: General;  Laterality:  N/A;   LEFT URETEROSCOPIC LASER LITHOTRIPSY STONE EXTRACTION W/ STENT PLACEMENT  03-09-2011;   09-18-2009   LEFT URETEROSCOPIC STONE EXTRACTION W/ STENT  09/25/2009   LUMBAR LAMINECTOMY  x2  ----  L3 - L5//  L5 -- S1   NEPHROLITHOTOMY Left 05/08/2020   Procedure: LEFT NEPHROLITHOTOMY PERCUTANEOUS;  Surgeon: Seltzer Norleen, MD;  [facility];  Service: Urology;  Laterality: Left;   NEPHROLITHOTOMY Right 06/24/2020   Procedure: RIGHT NEPHROLITHOTOMY PERCUTANEOUS;  Surgeon: Seltzer Norleen, MD;  [facility];  Service: Urology;  Laterality: Right;   POSTERIOR FUSION LUMBAR SPINE  09/08/2010   Re-do Laminectomy L3-S1 w/  Decompression and fusion    RIGHT URETEROSCOPIC STONE EXTRACTION  03/13/2009   TRANSTHORACIC ECHOCARDIOGRAM  07/04/2007   Mild focal basal septal hypertrophy/  ef 60%/  mild MVP involving anterior and posterior leaflets w/ trivial MR/  trivial pericardial effusion posterior to the heart    MEDICATIONS:  amLODipine (NORVASC) 5 MG tablet   esomeprazole (NEXIUM) 20 MG capsule   ibuprofen (ADVIL) 200 MG tablet   lidocaine  (LIDODERM ) 5 %   lisinopril (ZESTRIL) 40 MG tablet   No current facility-administered medications for this encounter.    Harlene Hoots Ward, PA-C [facility] [phone]

## 2024-05-29 ENCOUNTER — Other Ambulatory Visit: Payer: Self-pay

## 2024-05-29 ENCOUNTER — Ambulatory Visit (HOSPITAL_COMMUNITY): Admitting: Certified Registered"

## 2024-05-29 ENCOUNTER — Encounter (HOSPITAL_COMMUNITY): Payer: Self-pay | Admitting: General Surgery

## 2024-05-29 ENCOUNTER — Ambulatory Visit (HOSPITAL_COMMUNITY)
Admission: RE | Admit: 2024-05-29 | Discharge: 2024-05-29 | Disposition: A | Discharge status: Home / Self Care | Source: Ambulatory Visit | Attending: General Surgery | Admitting: General Surgery

## 2024-05-29 ENCOUNTER — Encounter (HOSPITAL_COMMUNITY)
Admission: RE | Disposition: A | Discharge status: Home / Self Care | Payer: Self-pay | Source: Ambulatory Visit | Attending: General Surgery

## 2024-05-29 ENCOUNTER — Ambulatory Visit (HOSPITAL_COMMUNITY): Payer: Self-pay | Admitting: Physician Assistant

## 2024-05-29 DIAGNOSIS — K4090 Unilateral inguinal hernia, without obstruction or gangrene, not specified as recurrent: Secondary | ICD-10-CM

## 2024-05-29 DIAGNOSIS — K4020 Bilateral inguinal hernia, without obstruction or gangrene, not specified as recurrent: Secondary | ICD-10-CM | POA: Diagnosis present

## 2024-05-29 DIAGNOSIS — Z87891 Personal history of nicotine dependence: Secondary | ICD-10-CM | POA: Insufficient documentation

## 2024-05-29 DIAGNOSIS — I129 Hypertensive chronic kidney disease with stage 1 through stage 4 chronic kidney disease, or unspecified chronic kidney disease: Secondary | ICD-10-CM | POA: Diagnosis not present

## 2024-05-29 DIAGNOSIS — N1831 Chronic kidney disease, stage 3a: Secondary | ICD-10-CM | POA: Insufficient documentation

## 2024-05-29 DIAGNOSIS — K219 Gastro-esophageal reflux disease without esophagitis: Secondary | ICD-10-CM | POA: Insufficient documentation

## 2024-05-29 DIAGNOSIS — K449 Diaphragmatic hernia without obstruction or gangrene: Secondary | ICD-10-CM | POA: Insufficient documentation

## 2024-05-29 HISTORY — PX: XI ROBOTIC ASSISTED INGUINAL HERNIA REPAIR WITH MESH: SHX6706

## 2024-05-29 SURGERY — REPAIR, HERNIA, INGUINAL, ROBOT-ASSISTED, LAPAROSCOPIC, USING MESH
Anesthesia: General | Laterality: Bilateral

## 2024-05-29 MED ORDER — FENTANYL CITRATE PF 50 MCG/ML IJ SOSY
50.0000 ug | PREFILLED_SYRINGE | INTRAMUSCULAR | Status: AC
  Administered 2024-05-29: 50 ug via INTRAVENOUS
  Filled 2024-05-29: qty 2

## 2024-05-29 MED ORDER — LACTATED RINGERS IV SOLN
INTRAVENOUS | Status: DC
Start: 2024-05-29 — End: 2024-05-29

## 2024-05-29 MED ORDER — SODIUM CHLORIDE 0.9% FLUSH: 3.0000 mL | Freq: Two times a day (BID) | INTRAVENOUS | Status: DC

## 2024-05-29 MED ORDER — MORPHINE SULFATE (PF) 2 MG/ML IV SOLN: 1.0000 mg | INTRAVENOUS | Status: DC | PRN

## 2024-05-29 MED ORDER — SODIUM CHLORIDE 0.9% FLUSH: 3.0000 mL | INTRAVENOUS | Status: DC | PRN

## 2024-05-29 MED ORDER — BUPIVACAINE-EPINEPHRINE (PF) 0.25% -1:200000 IJ SOLN
INTRAMUSCULAR | Status: AC
  Filled 2024-05-29: qty 30

## 2024-05-29 MED ORDER — OXYCODONE HCL 5 MG PO TABS: 5.0000 mg | ORAL_TABLET | Freq: Once | ORAL | Status: DC | PRN

## 2024-05-29 MED ORDER — DROPERIDOL 2.5 MG/ML IJ SOLN
0.6250 mg | Freq: Once | INTRAMUSCULAR | Status: AC | PRN
  Administered 2024-05-29: 0.625 mg via INTRAVENOUS

## 2024-05-29 MED ORDER — IBUPROFEN 200 MG PO TABS: 600.0000 mg | ORAL_TABLET | Freq: Four times a day (QID) | ORAL | 0 refills | Status: AC

## 2024-05-29 MED ORDER — CHLORHEXIDINE GLUCONATE 0.12 % MT SOLN
15.0000 mL | Freq: Once | OROMUCOSAL | Status: AC
  Administered 2024-05-29: 15 mL via OROMUCOSAL

## 2024-05-29 MED ORDER — SUGAMMADEX SODIUM 200 MG/2ML IV SOLN
INTRAVENOUS | Status: AC
  Filled 2024-05-29: qty 2

## 2024-05-29 MED ORDER — 0.9 % SODIUM CHLORIDE (POUR BTL) OPTIME
TOPICAL | Status: DC | PRN
  Administered 2024-05-29: 1000 mL

## 2024-05-29 MED ORDER — SUGAMMADEX SODIUM 200 MG/2ML IV SOLN
INTRAVENOUS | Status: DC | PRN
  Administered 2024-05-29: 200 mg via INTRAVENOUS

## 2024-05-29 MED ORDER — BUPIVACAINE-EPINEPHRINE 0.25% -1:200000 IJ SOLN
INTRAMUSCULAR | Status: DC | PRN
  Administered 2024-05-29: 30 mL

## 2024-05-29 MED ORDER — CHLORHEXIDINE GLUCONATE CLOTH 2 % EX PADS: 6.0000 | MEDICATED_PAD | Freq: Once | CUTANEOUS | Status: DC

## 2024-05-29 MED ORDER — DEXAMETHASONE SODIUM PHOSPHATE 10 MG/ML IJ SOLN
INTRAMUSCULAR | Status: AC
  Filled 2024-05-29: qty 1

## 2024-05-29 MED ORDER — FENTANYL CITRATE (PF) 100 MCG/2ML IJ SOLN
INTRAMUSCULAR | Status: AC
  Filled 2024-05-29: qty 2

## 2024-05-29 MED ORDER — OXYCODONE HCL 5 MG PO TABS: 5.0000 mg | ORAL_TABLET | ORAL | Status: DC | PRN

## 2024-05-29 MED ORDER — ACETAMINOPHEN 650 MG RE SUPP: 650.0000 mg | RECTAL | Status: DC | PRN

## 2024-05-29 MED ORDER — ROCURONIUM BROMIDE 10 MG/ML (PF) SYRINGE
PREFILLED_SYRINGE | INTRAVENOUS | Status: AC
  Filled 2024-05-29: qty 10

## 2024-05-29 MED ORDER — ACETAMINOPHEN 325 MG PO TABS: 650.0000 mg | ORAL_TABLET | Freq: Four times a day (QID) | ORAL | 0 refills | Status: AC

## 2024-05-29 MED ORDER — FENTANYL CITRATE PF 50 MCG/ML IJ SOSY
25.0000 ug | PREFILLED_SYRINGE | INTRAMUSCULAR | Status: DC | PRN
  Administered 2024-05-29 (×2): 50 ug via INTRAVENOUS

## 2024-05-29 MED ORDER — DEXAMETHASONE SODIUM PHOSPHATE 10 MG/ML IJ SOLN
INTRAMUSCULAR | Status: DC | PRN
  Administered 2024-05-29: 4 mg via INTRAVENOUS

## 2024-05-29 MED ORDER — DROPERIDOL 2.5 MG/ML IJ SOLN
INTRAMUSCULAR | Status: AC
  Filled 2024-05-29: qty 2

## 2024-05-29 MED ORDER — ROCURONIUM BROMIDE 10 MG/ML (PF) SYRINGE
PREFILLED_SYRINGE | INTRAVENOUS | Status: DC | PRN
  Administered 2024-05-29: 10 mg via INTRAVENOUS
  Administered 2024-05-29: 20 mg via INTRAVENOUS
  Administered 2024-05-29: 70 mg via INTRAVENOUS

## 2024-05-29 MED ORDER — CEFAZOLIN SODIUM-DEXTROSE 2-4 GM/100ML-% IV SOLN
2.0000 g | INTRAVENOUS | Status: AC
  Administered 2024-05-29: 2 g via INTRAVENOUS
  Filled 2024-05-29: qty 100

## 2024-05-29 MED ORDER — FENTANYL CITRATE PF 50 MCG/ML IJ SOSY
PREFILLED_SYRINGE | INTRAMUSCULAR | Status: AC
  Filled 2024-05-29: qty 2

## 2024-05-29 MED ORDER — PROPOFOL 10 MG/ML IV BOLUS
INTRAVENOUS | Status: AC
  Filled 2024-05-29: qty 20

## 2024-05-29 MED ORDER — ORAL CARE MOUTH RINSE: 15.0000 mL | Freq: Once | OROMUCOSAL | Status: AC

## 2024-05-29 MED ORDER — OXYCODONE HCL 5 MG PO TABS: 5.0000 mg | ORAL_TABLET | Freq: Three times a day (TID) | ORAL | 0 refills | Status: AC | PRN

## 2024-05-29 MED ORDER — SODIUM CHLORIDE 0.9 % IV SOLN: 250.0000 mL | INTRAVENOUS | Status: DC | PRN

## 2024-05-29 MED ORDER — LIDOCAINE HCL (CARDIAC) PF 100 MG/5ML IV SOSY
PREFILLED_SYRINGE | INTRAVENOUS | Status: DC | PRN
  Administered 2024-05-29: 40 mg via INTRAVENOUS

## 2024-05-29 MED ORDER — GABAPENTIN 300 MG PO CAPS
300.0000 mg | ORAL_CAPSULE | ORAL | Status: AC
  Administered 2024-05-29: 300 mg via ORAL
  Filled 2024-05-29: qty 1

## 2024-05-29 MED ORDER — ONDANSETRON HCL 4 MG/2ML IJ SOLN
INTRAMUSCULAR | Status: AC
  Filled 2024-05-29: qty 2

## 2024-05-29 MED ORDER — ONDANSETRON HCL 4 MG/2ML IJ SOLN
INTRAMUSCULAR | Status: DC | PRN
Start: 2024-05-29 — End: 2024-05-29
  Administered 2024-05-29: 4 mg via INTRAVENOUS

## 2024-05-29 MED ORDER — ACETAMINOPHEN 325 MG PO TABS: 650.0000 mg | ORAL_TABLET | ORAL | Status: DC | PRN

## 2024-05-29 MED ORDER — PHENYLEPHRINE HCL-NACL 20-0.9 MG/250ML-% IV SOLN
INTRAVENOUS | Status: DC | PRN
  Administered 2024-05-29: 40 ug/min via INTRAVENOUS

## 2024-05-29 MED ORDER — BUPIVACAINE HCL (PF) 0.25 % IJ SOLN
INTRAMUSCULAR | Status: DC | PRN
Start: 2024-05-29 — End: 2024-05-29
  Administered 2024-05-29: 20 mL

## 2024-05-29 MED ORDER — PROPOFOL 10 MG/ML IV BOLUS
INTRAVENOUS | Status: DC | PRN
  Administered 2024-05-29: 130 mg via INTRAVENOUS

## 2024-05-29 MED ORDER — OXYCODONE HCL 5 MG/5ML PO SOLN: 5.0000 mg | Freq: Once | ORAL | Status: DC | PRN

## 2024-05-29 MED ORDER — FENTANYL CITRATE (PF) 100 MCG/2ML IJ SOLN
INTRAMUSCULAR | Status: DC | PRN
  Administered 2024-05-29 (×4): 50 ug via INTRAVENOUS

## 2024-05-29 MED ORDER — ACETAMINOPHEN 500 MG PO TABS
1000.0000 mg | ORAL_TABLET | ORAL | Status: AC
  Administered 2024-05-29: 1000 mg via ORAL
  Filled 2024-05-29: qty 2

## 2024-05-29 SURGICAL SUPPLY — 47 items
APPLICATOR COTTON TIP 6 STRL (MISCELLANEOUS) IMPLANT
BAG COUNTER SPONGE SURGICOUNT (BAG) IMPLANT
BLADE SURG SZ11 CARB STEEL (BLADE) ×1 IMPLANT
CHLORAPREP W/TINT 26 (MISCELLANEOUS) ×1 IMPLANT
COVER MAYO STAND STRL (DRAPES) ×1 IMPLANT
COVER SURGICAL LIGHT HANDLE (MISCELLANEOUS) ×1 IMPLANT
COVER TIP SHEARS 8 DVNC (MISCELLANEOUS) ×1 IMPLANT
DERMABOND ADVANCED .7 DNX12 (GAUZE/BANDAGES/DRESSINGS) ×1 IMPLANT
DRAPE ARM DVNC X/XI (DISPOSABLE) ×3 IMPLANT
DRAPE COLUMN DVNC XI (DISPOSABLE) ×1 IMPLANT
DRIVER NDL MEGA SUTCUT DVNCXI (INSTRUMENTS) IMPLANT
DRIVER NDLE MEGA SUTCUT DVNCXI (INSTRUMENTS) ×1 IMPLANT
ELECT REM PT RETURN 15FT ADLT (MISCELLANEOUS) ×1 IMPLANT
FORCEPS BPLR 8 MD DVNC XI (FORCEP) ×1 IMPLANT
GAUZE 4X4 16PLY ~~LOC~~+RFID DBL (SPONGE) ×1 IMPLANT
GLOVE BIO SURGEON STRL SZ7 (GLOVE) ×2 IMPLANT
GOWN STRL REUS W/ TWL XL LVL3 (GOWN DISPOSABLE) ×2 IMPLANT
IRRIGATION SUCT STRKRFLW 2 WTP (MISCELLANEOUS) IMPLANT
KIT BASIN OR (CUSTOM PROCEDURE TRAY) ×1 IMPLANT
KIT TURNOVER KIT A (KITS) ×1 IMPLANT
MARKER SKIN DUAL TIP RULER LAB (MISCELLANEOUS) ×1 IMPLANT
MESH 3DMAX MID 5X7 LT XLRG (Mesh General) IMPLANT
MESH 3DMAX MID 5X7 RT XLRG (Mesh General) IMPLANT
NDL HYPO 22X1.5 SAFETY MO (MISCELLANEOUS) ×1 IMPLANT
NDL INSUFFLATION 14GA 120MM (NEEDLE) ×1 IMPLANT
NEEDLE HYPO 22X1.5 SAFETY MO (MISCELLANEOUS) ×1 IMPLANT
NEEDLE INSUFFLATION 14GA 120MM (NEEDLE) ×1 IMPLANT
OBTURATOR OPTICALSTD 8 DVNC (TROCAR) ×1 IMPLANT
PACK CARDIOVASCULAR III (CUSTOM PROCEDURE TRAY) ×1 IMPLANT
SCISSORS MNPLR CVD DVNC XI (INSTRUMENTS) ×1 IMPLANT
SEAL UNIV 5-12 XI (MISCELLANEOUS) ×3 IMPLANT
SOLUTION ANTFG W/FOAM PAD STRL (MISCELLANEOUS) ×1 IMPLANT
SOLUTION ELECTROSURG ANTI STCK (MISCELLANEOUS) ×1 IMPLANT
SPIKE FLUID TRANSFER (MISCELLANEOUS) ×1 IMPLANT
SUT MNCRL AB 4-0 PS2 18 (SUTURE) ×1 IMPLANT
SUT STRATA PDS 2-0 23 CT-1 (SUTURE) IMPLANT
SUT STRATAFIX SPIRAL PDS3-0 (SUTURE) ×1 IMPLANT
SUT VIC AB 2-0 SH 27X BRD (SUTURE) IMPLANT
SUT VIC AB 3-0 SH 27X BRD (SUTURE) ×1 IMPLANT
SUT VLOC 3-0 9IN GRN (SUTURE) IMPLANT
SYR 10ML LL (SYRINGE) ×1 IMPLANT
SYR 20ML LL LF (SYRINGE) ×1 IMPLANT
TAPE STRIPS DRAPE STRL (GAUZE/BANDAGES/DRESSINGS) ×1 IMPLANT
TOWEL GREEN STERILE FF (TOWEL DISPOSABLE) ×1 IMPLANT
TOWEL OR 17X26 10 PK STRL BLUE (TOWEL DISPOSABLE) ×1 IMPLANT
TROCAR Z-THREAD OPTICAL 5X100M (TROCAR) IMPLANT
TUBING INSUFFLATION 10FT LAP (TUBING) ×1 IMPLANT

## 2024-05-29 NOTE — Anesthesia Procedure Notes (Signed)
 Procedure Name: Intubation Date/Time: 05/29/2024 9:59 AM  Performed by: Metta Andrea NOVAK, CRNAPre-anesthesia Checklist: Patient identified, Emergency Drugs available, Suction available, Patient being monitored and Timeout performed Patient Re-evaluated:Patient Re-evaluated prior to induction Oxygen Delivery Method: Circle system utilized Preoxygenation: Pre-oxygenation with 100% oxygen Induction Type: IV induction Ventilation: Mask ventilation without difficulty Laryngoscope Size: Mac and 4 Grade View: Grade I Tube type: Oral Tube size: 7.5 mm Number of attempts: 1 Airway Equipment and Method: Stylet Placement Confirmation: ETT inserted through vocal cords under direct vision, positive ETCO2 and breath sounds checked- equal and bilateral Secured at: 23 cm Tube secured with: Tape Dental Injury: Teeth and Oropharynx as per pre-operative assessment

## 2024-05-29 NOTE — Anesthesia Postprocedure Evaluation (Signed)
 Anesthesia Post Note  Patient: Anthony [name]  Procedure(s) Performed: REPAIR, HERNIA, INGUINAL, ROBOT-ASSISTED, LAPAROSCOPIC, USING MESH (Bilateral)     Patient location during evaluation: PACU Anesthesia Type: General Level of consciousness: awake and alert Pain management: pain level controlled Vital Signs Assessment: post-procedure vital signs reviewed and stable Respiratory status: spontaneous breathing, nonlabored ventilation, respiratory function stable and patient connected to nasal cannula oxygen Cardiovascular status: blood pressure returned to baseline and stable Postop Assessment: no apparent nausea or vomiting Anesthetic complications: no   No notable events documented.  Last Vitals:  Vitals:   05/29/24 1315 05/29/24 1345  BP: 136/78 (!) 161/92  Pulse: 72 73  Resp: 14 18  Temp: 36.9 C 36.5 C  SpO2: 96% 96%    Last Pain:  Vitals:   05/29/24 1345  TempSrc:   PainSc: 4                  Rome Ade

## 2024-05-29 NOTE — Discharge Instructions (Signed)
 Home Care After Hernia Repair   Activity  Limit activity for the first 24 hours, then you may return to normal daily activities. Returning to normal daily activities as soon as you can following surgery will enhance recovery time.  No heavy lifting pushing or pulling, anything heavier than 10 pounds (gallon of milk weighs approx. 8.8 pounds) for 4-6 weeks from surgery date.  Do not mow the lawn, use a vacuum cleaner, or do any other strenuous activities without first consulting your surgical team.  Climb stairs slowly and watch your step.  Walk as often as you feel able to increase strength and endurance.  No driving or operating heavy machinery within 24 hours of taking narcotic pain medication.  Diet  Drink plenty of fluids and eat a light meal on the night of surgery. Some patients may find their appetite is poor for a week or two after surgery. This is a normal result of the stress of surgery-your appetite will return in time.   There are no specific diet restrictions after surgery.  Dressings and Wound Care  Dermabond/Durabond (skin glue): This will usually remain in place for 10-14 days, then naturally fall off your skin. You may take a shower 24 hrs after  surgery, carefully wash, not scrub the incision site with a mild non-scented soap. Pat dry with a soft towel.  Do not pick or peel skin glue off.  You can shower and let the water fall on the dressings above. Do not soak or submerge your incision(s) in a bath tub, hot tub, or swimming pool, until your doctor says it is ok to do so or the incision(s) have completely healed, usually about 2-4 weeks.  Do not use creams, powder, salves or balms on your incision(s).  What to Expect After Surgery   Moderate discomfort controlled with medications  Minimal drainage from incision  You may feel pain in one or both shoulders. This pain comes from the gas still left in your belly after the surgery, if you had laparoscopic surgery (several small  incisions). The pain should ease over several days to a week. Ambulation will help with this pain.   Belly swelling  Feeling fatigue and weak  Constipation after surgery is common. Drink plenty fluids and eat a high fiber diet.  Swelling - In some patients might feel that their hernia has returned after surgery-DO NOT Worry this is normal. Swelling may be due to the development of a seroma. Seroma is fluid that has built up where the hernia was repaired this is a normal result after surgery and it will slowly reabsorb back into your body over the next several weeks.   (MALE PATIENTS ONLY)-esp following inguinal hernia repair, it is expected that your scrotum may be slightly swollen or tender. Along with oral NSAIDS medications you can apply ice packs, wear compression shorts, and/or elevate scrotum using a rolled up towel.  Also, a bladder catheter may have been placed during your surgery.  Because of this, it may hurt to urinate for a couple of days or you may pass some blood clots. These issues are expected and will go away after a few days. Please notify surgeon or report to Emergency Dept. if you are unable to urinate or your symptoms worsen.  Pain Control: Prescribed Non-Narcotic Pain Medication  You will be given three prescriptions.  Two of them will be for prescription strength ibuprofen (i.e. Advil) and prescription strength acetaminophen (i.e. Tylenol).  The vast majority of patients will  just need these two medications.  One prescription will be for a 'rescue' prescription of an oral narcotic (oxycodone).  You may fill this if needed.  You will alternate taking the ibuprofen (600mg ) every 6 hours and also the acetaminophen (650mg ) every 6 hours so that you are taking one of those medications every 3 hours.  For example: o 0800 - take ibuprofen 600mg  o 1100 - take acetaminophen 650mg  o 1400 - take ibuprofen 600mg  o 1700 - take acetaminophen 650mg  o Etc.  Continue taking this alternating  pattern of ibuprofen and acetaminophen for 3 days  If you cannot take one or the other of these medications, just take the one you can every 6 hours.  If you are comfortable at night, you don't have to wake up and take a medication.  If you are still uncomfortable after taking either ibuprofen or acetaminophen, try gentle stretching exercise and ice packs (a bag of frozen vegetables works great).  If you are still uncomfortable, you may fill the narcotic prescription of Oxycodone and take as directed.  Once you have completed these prescriptions, your pain level should be low enough to stop taking medications altogether or just use an over the counter medication (ibuprofen or acetaminophen) as needed.   Pain Control: Over the Counter Medications to take as needed:  Colace/Docusate: May be prescribed by your surgeon to prevent constipation caused by the combination of narcotics, effects of anesthesia, and decreased ambulation.  Hold for loose stools or diarrhea. Take 100 mg 1-2 times a day starting tonight.   Fiber: High fiber foods, extra liquids (water 9-13 cups/day) can also assist with constipation. Examples of high fiber foods are fruit, bran. Prune juice and water are also good liquids to drink.  Milk of Magnesia/Miralax:  If constipated despite take the over the counter stool softeners, you may take Milk of Magnesia or Miralax as directed on bottle to assist with constipation.     Pepcid/Famotidine: May be prescribed while taking naproxen (Aleve) or other NSAIDs such as ibuprofen (Motrin/Advil) to prevent stomach upset or Acid-reflux symptoms. Take 1 tablet 1-2 times a day.   **Constipation: The first bowel movement may occur anywhere between 1-5 days after surgery.  As long as you are not nauseated or not having significant abdominal pain this variation is acceptable.  Narcotic pain medications can cause constipation increasing discomfort; early discontinuation will assist with bowel  management.If constipated despite taking stool softeners, you may take Milk of Magnesia or Miralax as directed on the bottle.     **Home medications: You may restart your home medications as directed by your respective Primary Care Physician or Surgeon.  When to notify your Doctor or Healthcare Team:   Sign of Wound Infection   Fever over 100 degrees.  Wound becomes extremely swollen, shows red streaks, warm to the touch, and/or drainage from the incision site or foul-smelling drainage.  Wound edges separate or opens up  Bleeding or bruising   If you have bleeding, apply pressure to the site and hold the pressure firmly for 5 minutes. If the bleeding continues, apply pressure again and call 911. If the bleeding stopped, call your doctor to report it.   Call your doctor or nurse if you have increased bleeding from your site and increased bruising or a lump forms or gets larger under your skin at the site.  Unrelieved Pain    Call your doctor or nurse if your pain gets worse or is not eased 1 hour after taking  your pain medicine, or if it is severe and uncontrolled.  Nausea and Vomiting   Call your doctor or nurse if you have nausea and vomiting that continues more than 24 hours, will not let you keep medicine down and will not let you keep fluids down  Fever, Flu-like symptoms   Fever over 100 degrees and/or chills  Gastrointestinal Bleeding Symptoms    Black tarry bowel movements.  This can be normal after surgery on the stomach, but should resolve in a day or two.    Call 911 if you suddenly have signs of blood loss such as:  Vomiting blood  Fast heart rate  Feeling faint, sweaty, or blacking out  Passing bright red blood from your rectum  Blood Clot Symptoms   Tender, swollen or reddened areas in your calf muscle or thighs.  Numbness or tingling in your lower leg or calf, or at the top of your leg or groin  Skin on your leg looks pale or blue or feels cold to touch  Chest pain  or have trouble breathing, lightheadedness, fast heart rate  Sudden Onset of Symptoms    Call 911 if you suddenly have:  Leg weakness and spasm  Loss of bladder or bowel function  Seizure  Confusion, severe headache, dizziness or feeling unsteady, problems talking, difficulty swallowing, and/or numbness or muscle weakness as these could be signs of a stroke.   Follow up Appointment Your follow up appointment should be scheduled 2-3 weeks after your surgery date.  If you have not previously scheduled for a follow-up visit you can be scheduled by contacting [phone].

## 2024-05-29 NOTE — Op Note (Signed)
 Anthony [name] ([id number])  Operative Report   Date 05/29/2024  PREOPERATIVE DIAGNOSIS: Right Inguinal hernia  POSTOPERATIVE DIAGNOSIS: Bilateral inguinal hernias  PROCEDURE:  Robotic Bilateral Inguinal Hernia Repair with Mesh (Bard 3D Midweight XL Right and Left Polypropylene Mesh) 2. Left transversus abdominus plane block for post-operative pain   SURGEON: Cordella Idler, MD  ANESTHESIA: General Anesthesia    INTRAOPERATIVE FINDINGS: Moderate direct and large indirect hernias on the right. Moderate direct hernia on the left IMPLANTS: Implant Name Type Inv. Item Serial No. Manufacturer Lot No. LRB No. Used Action  MESH 3DMAX MID 5X7 LT MARSHA - [id number] Mesh General MESH 3DMAX MID 5X7 LT MARSHA HIPOLITO HAIL BARD ACCESS [id number] Left 1 Implanted  MESH 3DMAX MID 5X7 RT XLRG - [id number] Mesh General MESH 3DMAX MID 5X7 RT MARSHA HIPOLITO INC BARD ACCESS [id number] Right 1 Implanted    ESTIMATED BLOOD LOSS: Minimal  COMPLICATIONS: None  SPECIMENS: None  OPERATIVE INDICATIONS: Pt is a 73 y.o. male who presents with a known right inguinal hernia.  The patient desires definitive repair.  The procedure's risks, benefits, and alternatives were explained to the patient.  Risks, including the risks of bleeding, infection, need for mesh removal, and potential for hernia recurrence, were discussed.  The patient agreed to proceed and signed informed consent in front of a witness. We discussed the possibility of discovering a left-sided hernia at the time of surgery and he was interested in repair if that was the case.   DESCRIPTION OF PROCEDURE: After preoperatively identifying the patient in holding, the patient was brought to the operating room and placed supine on the operating room table.  Both arms were tucked and padded to avoid potential nerve injury.  Sequential Compression Devices were placed bilaterally.  After induction of general anesthesia, the patient was given the appropriate  perioperative antibiotics.  The abdomen was prepped and draped in a typical sterile fashion.  A JACHO approved time out, where the name of the patient, the operation, and the intended site were confirmed. The abdomen was accessed with a Veress needle via the standard drop technique in the LUQ at Palmer's point.  Insufflation was established.  An 8 mm optical port was placed under direct visualization in the RLQ.  A camera was introduced into the abdomen, and a thorough inspection of the abdomen was performed to confirm there was no additional pathology.  Two additional 8 mm robotic ports were placed laterally under direct visualization, one superior to the umbilicus and another in the RLQ. The patient was then placed into 15-degree Trendelenburg position to facilitate examination of the bilateral inguinal spaces.  A thorough inspection of the abdomen was undertaken.  Bilateral inguinal hernias were identified and amenable to robotic repair. On the right was a moderate direct hernia and a large indirect hernia. On the left was a moderate direct hernia. He had received pre-operative TAP block on the right. I decided to perform one on the left. I performed a transversus abdominus plane block under direct visualization using 0.25% marcaine  with epinephrine .   The [employer] robot was brought into the field and docked.  An 8 mm 30-degree scope was placed in the mid-abdominal port.  I started on the right. The peritoneum was taken down 6 cm superior to the hernia from the anterior abdominal wall, and dissection was taken inferiorly towards the hernia.  Medial to the epigastric vessels, the parietal compartment was dissected and completed to visualize the rectus muscle.  This dissection was  carried down to the symphysis pubis and obturator foramen.  The retropubic space was dissected to expose at least 2 cm contralateral to the midline.  Cooper's ligament was exposed and cleared at least 2 cm below the ligament to ensure  adequate space for the inferior border of the mesh.  Hesselbach's triangle was cleared, assessing for a direct hernia. The direct hernia was reduced, dissecting the contents away from the border of the transversalis fascia.  Any herniated femoral contents were reduced as well.  Lateral to the epigastric vessels, the dissection was carried out into the visceral compartment, continuing in the true preperitoneal plane.  The indirect hernia sac was carefully reduced and separated from the cord structures with medial retraction and a combination of blunt/sharp dissection and focused cautery.  This dissection continued until the cord structures were parietalized completely, allowing for continuous visualization of the reflected peritoneum with the line originating 2 cm below Cooper's medially and across the psoas muscle in the lateral compartment.  The internal ring was interrogated for a cord lipoma.  There was no major cord lipoma  Having achieved a complete dissection with a critical view of the entire myopectineal orifice, a piece of Bard 3D Midweight XL Right Polypropylene Mesh was introduced into the field.  It was centered at the iliopubic tract, with the medial side crossing the midline and the inferior edge positioned 2 cm below Cooper's ligament.  With complete coverage of the myopectineal orifice, the inferior edge of the peritoneum was posterior and inferior to the mesh.  The lateral aspect of the mesh extended 3-5 cm beyond the lateral edge of the psoas.  Cephalad retraction of the peritoneal flap did not cause lifting of the inferior mesh edge or cord structures.  The mesh was fixated using an interrupted 3-0 Vicryl suture placed to the ipsilateral Coopers ligament.    The same series of steps was performed on the left side. Findings were notable for a moderate direct hernia. There was no indirect hernia and no sizeable cord lipoma.  The peritoneal flap was closed with a running 3-0 Stratafix barbed  suture.  A hole on the right side of the flap was closed with 3-0 v-loc. Hemostasis was assured in the entirety of the abdomen.  The two lateral ports were removed under direct visualization.  The abdomen was desufflated under direct visualization.  The port sites were closed, local anesthetic was infiltrated, and Dermabond was applied.  After confirming twice that the sponge, needle, and instrument counts were correct, the procedure was terminated, the patient was extubated and transferred to the recovery room in stable condition.     DISPOSITION: Stable to PACU.   Electronically Signed By: Cordella DELENA Idler 05/29/2024 11:59 AM

## 2024-05-29 NOTE — Transfer of Care (Signed)
 Immediate Anesthesia Transfer of Care Note  Patient: Anthony [name]  Procedure(s) Performed: REPAIR, HERNIA, INGUINAL, ROBOT-ASSISTED, LAPAROSCOPIC, USING MESH (Bilateral)  Patient Location: PACU  Anesthesia Type:General  Level of Consciousness: awake, alert , and patient cooperative  Airway & Oxygen Therapy: Patient Spontanous Breathing and Patient connected to face mask oxygen  Post-op Assessment: Report given to RN and Post -op Vital signs reviewed and stable  Post vital signs: Reviewed and stable  Last Vitals:  Vitals Value Taken Time  BP 158/94 05/29/24 12:12  Temp    Pulse 78 05/29/24 12:14  Resp 23 05/29/24 12:14  SpO2 100 % 05/29/24 12:14  Vitals shown include unfiled device data.  Last Pain:  Vitals:   05/29/24 0940  TempSrc:   PainSc: 0-No pain      Patients Stated Pain Goal: 4 (05/29/24 0805)  Complications: No notable events documented.

## 2024-05-29 NOTE — H&P (Signed)
 Anthony [name] Mar 04, 1951  [id number].    HPI:  73 y/o M with a right inguinal hernia who presents for elective repair. He reports that he is in his usual state of health and denies any recent changes in mediation.   ROS: Review of Systems  Constitutional: Negative.   HENT: Negative.    Eyes: Negative.   Respiratory: Negative.    Cardiovascular: Negative.   Gastrointestinal: Negative.   Genitourinary: Negative.   Musculoskeletal: Negative.   Skin: Negative.   Neurological: Negative.   Endo/Heme/Allergies: Negative.   Psychiatric/Behavioral: Negative.      Family History  Problem Relation Age of Onset   Colon cancer Mother    Stroke Mother    Cancer Mother    Diabetes Mother    Hypertension Mother    Hypertension Brother    Colon cancer Brother        Pre-cancerous Polyps removed   Kidney disease Brother        Chronic- secondary to Hypertension   Stroke Father    Thyroid disease Daughter    Colon cancer Maternal Aunt        x 2   Colon cancer Maternal Uncle     Past Medical History:  Diagnosis Date   Arthritis    hands   Benign prostatic hyperplasia    Bilateral ureteral calculi    Chronic back pain    Chronic kidney disease, stage 3a (HCC)    Complication of anesthesia    self limited tremors after 04/19/15 cholecystectomy   DDD (degenerative disc disease), lumbosacral    GERD (gastroesophageal reflux disease)    GERD without esophagitis    H/O mitral valve prolapse 2008 per echo   asymptomatic and does not see cardiologist.    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   Pneumonia    Renal calculi    left   Renal insufficiency     Past Surgical History:  Procedure Laterality Date   ANTERIOR CERVICAL DECOMP/DISCECTOMY FUSION  C5--C7  06-05-2002//  C7--T1   03-29-2006   BICEPS TENDON REPAIR Right 2004 approx    CHOLECYSTECTOMY     COLONOSCOPY WITH PROPOFOL  N/A 06/27/2015   Procedure: COLONOSCOPY WITH PROPOFOL ;  Surgeon: Rogelia Copping, MD;  Location: [facility];  Service: Endoscopy;  Laterality: N/A;   CYSTO/  RIGHT RETROGRADE PYELOGRAM/ URETEROSCOPY ATTEMPTED STONE MANIPULATION/  RIGHT STENT PLACEMENT  03/03/2009   CYSTOSCOPY WITH RETROGRADE PYELOGRAM, URETEROSCOPY AND STENT PLACEMENT Right 01/24/2014   Procedure: CYSTOSCOPY WITH RETROGRADE PYELOGRAM, URETEROSCOPY AND STENT PLACEMENT, holmium laser, stone extraction;  Surgeon: Oliva VEAR Oiler, MD;  Location: [facility];  Service: Urology;  Laterality: Right;   CYSTOSCOPY WITH RETROGRADE PYELOGRAM, URETEROSCOPY AND STENT PLACEMENT Right 02/07/2014   Procedure: RIGHT URETEROSCOPY, STONE EXTRACTION AND POSSIBLE STENT PLACEMENT;  Surgeon: Norleen JINNY Seltzer, MD;  Location: [facility];  Service: Urology;  Laterality: Right;   CYSTOSCOPY WITH RETROGRADE PYELOGRAM, URETEROSCOPY AND STENT PLACEMENT Bilateral 02/04/2015   Procedure: CYSTOSCOPY WITH RETROGRADE PYELOGRAM, URETEROSCOPY AND STENT PLACEMENT, BILATERAL;  Surgeon: Arlena Gal, MD;  Location: [facility];  Service: Urology;  Laterality: Bilateral;   CYSTOSCOPY WITH URETEROSCOPY, STONE BASKETRY AND STENT PLACEMENT Bilateral 02/20/2015   Procedure: CYSTOSCOPY WITH BILATERAL URETEROSCOPY, STONE EXTRACTION WITH LASER  AND STENT PLACEMENT;  Surgeon: Norleen  Watt, MD;  Location[facility];  Service: Urology;  Laterality: Bilateral;   CYSTOSCOPY/URETEROSCOPY/HOLMIUM LASER/STENT PLACEMENT Left 09/16/2020   Procedure: CYSTOSCOPY LEFT RETROGRADE PYELOGRAM LEFT URETEROSCOPY/HOLMIUM LASER/STENT PLACEMENT;  Surgeon: Watt Rush, MD;  Location: [facility];  Service: Urology;  Laterality: Left;   ESOPHAGOGASTRODUODENOSCOPY (EGD) WITH PROPOFOL  N/A 10/17/2015   Procedure: ESOPHAGOGASTRODUODENOSCOPY (EGD) WITH PROPOFOL ;  Surgeon: Rogelia Copping, MD;  Location: [facility];   Service: Endoscopy;  Laterality: N/A;   EXTRACORPOREAL SHOCK WAVE LITHOTRIPSY  Left 02-13-2015//  Right 01-17-2014   HOLMIUM LASER APPLICATION Right 02/07/2014   Procedure: HOLMIUM LASER APPLICATION;  Surgeon: Rush JINNY Watt, MD;  Location: [facility];  Service: Urology;  Laterality: Right;   HOLMIUM LASER APPLICATION N/A 02/20/2015   Procedure: HOLMIUM LASER APPLICATION;  Surgeon: Rush Watt, MD;  Location: [facility];  Service: Urology;  Laterality: N/A;   IR URETERAL STENT LEFT NEW ACCESS W/O SEP NEPHROSTOMY CATH  05/08/2020   IR URETERAL STENT RIGHT NEW ACCESS W/O SEP NEPHROSTOMY CATH  06/23/2020   LAPAROSCOPIC CHOLECYSTECTOMY SINGLE PORT N/A 04/19/2015   Procedure: LAPAROSCOPIC CHOLECYSTECTOMY;  Surgeon: Charlie FORBES Fell, MD;  Location: [facility];  Service: General;  Laterality: N/A;   LEFT URETEROSCOPIC LASER LITHOTRIPSY STONE EXTRACTION W/ STENT PLACEMENT  03-09-2011;   09-18-2009   LEFT URETEROSCOPIC STONE EXTRACTION W/ STENT  09/25/2009   LUMBAR LAMINECTOMY  x2  ----  L3 - L5//  L5 -- S1   NEPHROLITHOTOMY Left 05/08/2020   Procedure: LEFT NEPHROLITHOTOMY PERCUTANEOUS;  Surgeon: Watt Rush, MD;  Location: [facility];  Service: Urology;  Laterality: Left;   NEPHROLITHOTOMY Right 06/24/2020   Procedure: RIGHT NEPHROLITHOTOMY PERCUTANEOUS;  Surgeon: Watt Rush, MD;  Location: [facility];  Service: Urology;  Laterality: Right;   POSTERIOR FUSION LUMBAR SPINE  09/08/2010   Re-do Laminectomy L3-S1 w/  Decompression and fusion    RIGHT URETEROSCOPIC STONE EXTRACTION  03/13/2009   TRANSTHORACIC ECHOCARDIOGRAM  07/04/2007   Mild focal basal septal hypertrophy/  ef 60%/  mild MVP involving anterior and posterior leaflets w/ trivial MR/  trivial pericardial effusion posterior to the heart    Social History:  reports that he has quit smoking. His smoking use included pipe. He has never been exposed to tobacco smoke. He has never used smokeless tobacco. He reports that he  does not currently use alcohol after a past usage of about 1.0 standard drink of alcohol per week. He reports that he does not use drugs.  Allergies:  Allergies  Allergen Reactions   Cheese Other (See Comments)    Stomach upset , cramping.    Codeine Itching   Lactose Intolerance (Gi) Other (See Comments)    GI distress   Morphine  And Codeine Itching   Morphine  And Codeine Other (See Comments)    Insomnia     Medications Prior to Admission  Medication Sig Dispense Refill   amLODipine (NORVASC) 5 MG tablet Take 5 mg by mouth at bedtime.     esomeprazole (NEXIUM) 20 MG capsule Take 20 mg by mouth daily before breakfast.     ibuprofen (ADVIL) 200 MG tablet Take 600 mg by mouth every 8 (eight) hours as needed (pain.).     lisinopril (ZESTRIL) 40 MG tablet Take 40 mg by mouth at bedtime.     lidocaine  (LIDODERM ) 5 % Place 1 patch onto the skin daily as needed (pain). Remove & Discard patch within 12 hours or as directed by MD      Physical  Exam: Blood pressure 138/84, pulse 63, temperature 97.7 F (36.5 C), temperature source Oral, resp. rate 18, height 5' 10 (1.778 m), weight 72.8 kg, SpO2 96%. Gen: male, NAD Abd: soft, non-distended, right groin marked  No results found for this or any previous visit (from the past 48 hours). No results found.  Assessment/Plan 73 y/o M with a right inguinal hernia  - Will proceed to the OR. We discussed the alternatives and potential risks of surgery, including but not limited to: bleeding, infection, damage to bowel or surrounding structures, damage to the vas/cord, mesh complications, chronic pain, recurrent hernia, and need for additional procedures. All questions were addressed and consent was obtained.    Cordella DELENA Polly Marlis Cheron Surgery 05/29/2024, 9:08 AM Please see Amion for pager number during day hours 7:00am-4:30pm or 7:00am -11:30am on weekends

## 2024-05-29 NOTE — Anesthesia Procedure Notes (Addendum)
 Anesthesia Regional Block: TAP block   Pre-Anesthetic Checklist: , timeout performed,  Correct Patient, Correct Site, Correct Laterality,  Correct Procedure, Correct Position, site marked,  Risks and benefits discussed,  Surgical consent,  Pre-op evaluation,  At surgeon's request and post-op pain management  Laterality: Right  Prep: chloraprep       Needles:  Injection technique: Single-shot  Needle Type: Other     Needle Length: 4cm  Needle Gauge: 25     Additional Needles:   Narrative:  Start time: 05/29/2024 9:30 AM End time: 05/29/2024 9:35 AM Injection made incrementally with aspirations every 5 mL.  Performed by: Personally  Anesthesiologist: Boone Fess, MD  Additional Notes: Patient's chart reviewed and they were deemed appropriate candidate for procedure, per surgeon's request. Patient educated about risks, benefits, and alternatives of the block including but not limited to: temporary or permanent nerve damage, bleeding, infection, damage to surround tissues, intestinal perforation, block failure, local anesthetic toxicity. They expressed understanding. A formal time-out was conducted after induction of anesthesia consistent with institution rules.  Full monitors utilized. The site was prepped with skin prep and allowed to dry, and sterile gloves were used. A high frequency linear ultrasound probe with probe cover was utilized throughout. External oblique, internal oblique, and transversus abdominus muscles visualized and appeared anatomically normal. Aspiration performed every 5ml. Blood vessels and peritoneum were avoided. All injections were performed without resistance and free of blood and paresthesias. The patient tolerated the procedure well.  Injectate: 20ml 0.25% bupivacaine .

## 2024-05-30 ENCOUNTER — Encounter (HOSPITAL_COMMUNITY): Payer: Self-pay | Admitting: General Surgery
